# Patient Record
Sex: Female | Born: 1935 | Race: White | Hispanic: No | Marital: Married | Smoking: Never smoker
Health system: Southern US, Community
[De-identification: ages and names within clinical notes are randomized; demographics above are authoritative.]

## PROBLEM LIST (undated history)

## (undated) DIAGNOSIS — I639 Cerebral infarction, unspecified: Secondary | ICD-10-CM

## (undated) DIAGNOSIS — H9190 Unspecified hearing loss, unspecified ear: Secondary | ICD-10-CM

## (undated) DIAGNOSIS — E785 Hyperlipidemia, unspecified: Secondary | ICD-10-CM

## (undated) DIAGNOSIS — I10 Essential (primary) hypertension: Secondary | ICD-10-CM

## (undated) HISTORY — DX: Essential (primary) hypertension: I10

## (undated) HISTORY — PX: LEG SURGERY: SHX1003

## (undated) HISTORY — DX: Unspecified hearing loss, unspecified ear: H91.90

## (undated) HISTORY — DX: Hyperlipidemia, unspecified: E78.5

## (undated) HISTORY — PX: BREAST SURGERY: SHX581

---

## 1998-06-23 ENCOUNTER — Inpatient Hospital Stay (HOSPITAL_COMMUNITY): Admission: AD | Admit: 1998-06-23 | Discharge: 1998-06-25 | Payer: Self-pay | Admitting: Cardiology

## 2001-06-07 ENCOUNTER — Encounter: Payer: Self-pay | Admitting: Internal Medicine

## 2001-06-07 ENCOUNTER — Ambulatory Visit (HOSPITAL_COMMUNITY): Admission: RE | Admit: 2001-06-07 | Discharge: 2001-06-07 | Payer: Self-pay | Admitting: Internal Medicine

## 2003-08-15 ENCOUNTER — Ambulatory Visit (HOSPITAL_COMMUNITY): Admission: RE | Admit: 2003-08-15 | Discharge: 2003-08-15 | Payer: Self-pay | Admitting: Internal Medicine

## 2004-06-07 ENCOUNTER — Inpatient Hospital Stay (HOSPITAL_COMMUNITY): Admission: EM | Admit: 2004-06-07 | Discharge: 2004-06-09 | Payer: Self-pay | Admitting: Emergency Medicine

## 2004-07-15 ENCOUNTER — Ambulatory Visit: Payer: Self-pay | Admitting: Orthopedic Surgery

## 2004-08-25 ENCOUNTER — Ambulatory Visit: Payer: Self-pay | Admitting: Orthopedic Surgery

## 2004-09-01 ENCOUNTER — Encounter (HOSPITAL_COMMUNITY): Admission: RE | Admit: 2004-09-01 | Discharge: 2004-10-01 | Payer: Self-pay | Admitting: Orthopedic Surgery

## 2004-09-08 ENCOUNTER — Ambulatory Visit: Payer: Self-pay | Admitting: Orthopedic Surgery

## 2004-09-22 ENCOUNTER — Ambulatory Visit: Payer: Self-pay | Admitting: Orthopedic Surgery

## 2004-10-04 ENCOUNTER — Ambulatory Visit: Payer: Self-pay | Admitting: Orthopedic Surgery

## 2004-10-04 ENCOUNTER — Encounter (HOSPITAL_COMMUNITY): Admission: RE | Admit: 2004-10-04 | Discharge: 2004-11-03 | Payer: Self-pay | Admitting: Orthopedic Surgery

## 2004-10-18 ENCOUNTER — Ambulatory Visit: Payer: Self-pay | Admitting: Orthopedic Surgery

## 2005-01-07 ENCOUNTER — Ambulatory Visit (HOSPITAL_COMMUNITY): Admission: RE | Admit: 2005-01-07 | Discharge: 2005-01-07 | Payer: Self-pay | Admitting: Internal Medicine

## 2005-03-11 ENCOUNTER — Ambulatory Visit (HOSPITAL_COMMUNITY): Admission: RE | Admit: 2005-03-11 | Discharge: 2005-03-11 | Payer: Self-pay | Admitting: Internal Medicine

## 2005-03-25 ENCOUNTER — Ambulatory Visit (HOSPITAL_COMMUNITY): Admission: RE | Admit: 2005-03-25 | Discharge: 2005-03-25 | Payer: Self-pay | Admitting: Neurosurgery

## 2005-07-11 ENCOUNTER — Ambulatory Visit (HOSPITAL_COMMUNITY): Admission: RE | Admit: 2005-07-11 | Discharge: 2005-07-11 | Payer: Self-pay | Admitting: Internal Medicine

## 2005-10-12 ENCOUNTER — Ambulatory Visit: Payer: Self-pay | Admitting: Internal Medicine

## 2005-10-12 ENCOUNTER — Encounter (INDEPENDENT_AMBULATORY_CARE_PROVIDER_SITE_OTHER): Payer: Self-pay | Admitting: Internal Medicine

## 2005-10-12 ENCOUNTER — Ambulatory Visit (HOSPITAL_COMMUNITY): Admission: RE | Admit: 2005-10-12 | Discharge: 2005-10-12 | Payer: Self-pay | Admitting: Internal Medicine

## 2006-06-28 ENCOUNTER — Ambulatory Visit (HOSPITAL_COMMUNITY): Admission: RE | Admit: 2006-06-28 | Discharge: 2006-06-28 | Payer: Self-pay | Admitting: Internal Medicine

## 2006-12-29 ENCOUNTER — Inpatient Hospital Stay (HOSPITAL_COMMUNITY): Admission: EM | Admit: 2006-12-29 | Discharge: 2007-01-01 | Payer: Self-pay | Admitting: *Deleted

## 2007-05-22 ENCOUNTER — Ambulatory Visit (HOSPITAL_COMMUNITY): Admission: RE | Admit: 2007-05-22 | Discharge: 2007-05-22 | Payer: Self-pay | Admitting: Internal Medicine

## 2008-05-22 ENCOUNTER — Ambulatory Visit (HOSPITAL_COMMUNITY): Admission: RE | Admit: 2008-05-22 | Discharge: 2008-05-22 | Payer: Self-pay | Admitting: Internal Medicine

## 2010-09-25 ENCOUNTER — Encounter: Payer: Self-pay | Admitting: Internal Medicine

## 2010-10-22 ENCOUNTER — Encounter (INDEPENDENT_AMBULATORY_CARE_PROVIDER_SITE_OTHER): Payer: Self-pay

## 2010-10-27 NOTE — Letter (Signed)
Summary: Recall Colonoscopy/Endoscopy, Change to Office Visit  [facility]  [address]   [place], [place] [address]   Phone: [phone]  Fax: [phone]      October 22, 2010   GENIENE LIST [address] [place], [place]  [address] [birth year]   Dear Ms. [facility],   According to our records, it is time for you to schedule a Colonoscopy/Endoscopy. However, after reviewing your medical record, we recommend an office visit in order to determine your need for a repeat procedure.  Please call [phone] at your convenience to schedule an office visit. If you have any questions or concerns, please feel free to contact our office.   Sincerely,   Cloria Spring LPN  [facility] Ph: [phone]   Fax: [phone]

## 2011-01-21 NOTE — Discharge Summary (Signed)
NAMEHOLLYE, PRITT               ACCOUNT NO.:  [id number]   MEDICAL RECORD NO.:  [id number]           PATIENT TYPE:  INP   LOCATION:                                FACILITY:  [facility]   PHYSICIAN:  Kingsley Callander. Ouida Sills, M.D.     DATE OF BIRTH:  [birth year]   DATE OF ADMISSION:  06/07/2004  DATE OF DISCHARGE:  LH                                 DISCHARGE SUMMARY   DISCHARGE DIAGNOSES:  1.  Right medial tibial plateau fracture.  2.  Right ankle fracture.  3.  Chest wall contusion.  4.  History of stroke.  5.  Hypertension.  6.  Hyperlipidemia.   HOSPITAL COURSE:  This patient is a 77 year old female who fell down a  flight of stairs at home.  She was evaluated in the emergency room and found  to have a medial tibial plateau fracture on the right as well as a fracture  of the right medial malleolus.  She underwent a CT scan of the head which  was negative.  She underwent a CT scan of the chest which was negative.  She  had persistent chest pain due to chest wall contusion.  She was hospitalized  and treated with analgesics as needed.  She was seen in orthopedic  consultation by Dr. Romeo Apple.  An air cast and knee immobilizer were placed  on the right leg.  She was evaluated and treated with physical therapy.  She  was felt to be improved and stable for discharge by October 5.  She will  have home health physical therapy.  She was treated with Lovenox while  hospitalized.   DISCHARGE MEDICATIONS:  1.  Darvocet q.4h. p.r.n.  2.  Ambien q.h.s. p.r.n.  3.  Altace 5 mg daily.  4.  Zocor 40 mg daily.  5.  Aspirin 81 mg daily.  6.  Xanax 0.5 q.4h. p.r.n.   FOLLOWUP:  Patient will see Dr. Romeo Apple in follow-up in two weeks.  She  will follow up in my office as scheduled or as needed.       ___________________________________________  Kingsley Callander. Ouida Sills, M.D.    ROF/MEDQ  D:  06/09/2004  T:  06/09/2004  Job:  [id number]

## 2011-01-21 NOTE — H&P (Signed)
NAMEMESHA, SCHAMBERGER               ACCOUNT NO.:  [id number]   MEDICAL RECORD NO.:  [id number]          PATIENT TYPE:  EMS   LOCATION:  ED                            FACILITY:  [facility]   PHYSICIAN:  Melvyn Novas, MDDATE OF BIRTH:  10-19-1930   DATE OF ADMISSION:  06/07/2004  DATE OF DISCHARGE:  LH                                HISTORY & PHYSICAL   The patient is a 77 year old white female who apparently got up in the  middle of the night to use the bathroom in the dark and fell down 14 stairs.  She denied any antecedent palpitations, dizziness, chest pain, or syncope  prior to fall.  She states she just fell in the dark.  She had no loss of  consciousness, no seizure activity reported by husband.  She arrives in the  ER.  She has multiple contusions of chest and back and has a fractured right  medial malleolus and a medial tibial plateau fracture as well as a right  ankle laceration over the Achilles area.  She is admitted for analgesia,  observation, and orthopedic consultation for the above issues.  She is alert  and oriented right now and complains of mild pleuritic pain upon  respirations.  She has a CT scan of her head and chest, which are  essentially negative for fractures.   PAST MEDICAL HISTORY:  1.  Status post CVA with residual dysarthria.  2.  Hypertension.  3.  Hyperlipidemia.  4.  Anxiety.   PAST SURGICAL HISTORY:  1.  Tonsillectomy.  2.  Left breast biopsy.   SOCIAL HISTORY:  She lives with her husband.  She smoked for 40 years, one  pack per day.  She quit in February 2005, but she still smokes a few  cigarettes according to the husband.   CURRENT MEDICATIONS:  1.  Altace 5 mg per day.  2.  Aspirin 81 mg per day.  3.  Xanax 0.5 mg p.r.n.  4.  Zocor 40 mg per day.   She has no known allergies.   PHYSICAL EXAMINATION:  VITAL SIGNS:  Blood pressure 129/62, pulse 57 and  regular, respiratory rate is 20.  She is afebrile.  HEENT:  Head  normocephalic, atraumatic.  Eyes PERRLA, EOMI.  Sclerae clear,  conjunctivae pink.  NECK:  No carotid bruits, no thyromegaly, no thyroid bruits.  CHEST:  Lungs show good breath sounds in both bases.  No rales, wheeze, or  rhonchi appreciable.  CARDIAC:  Regular rhythm, S1, S2 of normal intensity, no S3, S4 gallops.  No  murmurs, no heaves, thrills, or rubs appreciable.  ABDOMEN:  Soft, nontender, bowel sounds normoactive.  No active guarding or  rebound, no mass, no megaly.  EXTREMITIES:  Right ankle has a cast immobilizer on it currently.  She has  multiple contusions of the left ankle and a small laceration of the right  Achilles area.  NEUROLOGIC:  Cranial nerves II-XII grossly intact.  The patient has mild  dysarthria.  Sensorimotor 5/5 all extremities.  Plantars downgoing.   IMPRESSION:  1.  Status post fall with  multiple contusions of ribs and back.  2.  Fractured right medial malleolus and medial tibial plateau fracture.  3.  Hypertension.  4.  Hyperlipidemia.  5.  Anxiety.   The plan is to admit, obtain neurologic consultation with Vickki Hearing, M.D., and control pain with Dilaudid 1-2 mg IV q.4h. p.r.n.  Get  CBC, CMP, 12-lead EKG, and continue Altace 5 mg per day.  Will add Lovenox  30 mg subcu daily, and I will make further recommendations as the data base  expands.      RMD/MEDQ  D:  06/07/2004  T:  06/07/2004  Job:  [id number]

## 2011-01-21 NOTE — Op Note (Signed)
NAMESWATHI, DAUPHIN               ACCOUNT NO.:  [id number]   MEDICAL RECORD NO.:  [id number]          PATIENT TYPE:  AMB   LOCATION:  DAY                           FACILITY:  [facility]   PHYSICIAN:  Lionel December, M.D.    DATE OF BIRTH:  [birth year]   DATE OF PROCEDURE:  10/12/2005  DATE OF DISCHARGE:                                 OPERATIVE REPORT   PROCEDURE:  Colonoscopy with polypectomy.   INDICATION:  Amy Sawyer is a 77 year old Caucasian female who is here for  surveillance colonoscopy. Last exam was in August 1999 with removal of  multiple polyps, most of which were hyperplastic, but one had villus change  in it. Family history is negative for colorectal carcinoma. She states she  has been constipated for about a month. However, she remains with good  appetite and stable weight. Procedure and risks were reviewed with the  patient, and informed consent was obtained.   MEDICINES FOR CONSCIOUS SEDATION:  Demerol 50 mg IV, Versed 4 mg IV.   FINDINGS:  Procedure performed in endoscopy suite. The patient's vital signs  and O2 saturation were monitored during the procedure remained stable. The  patient was placed in left lateral position and rectal examination  performed. No abnormality noted on external or digital exam. Olympus  videoscope was placed in the rectum and advanced under vision into sigmoid  colon and beyond. Preparation was satisfactory. She had diverticula  scattered throughout the colon. Scope was passed into cecum which was  identified by ileocecal valve and appendiceal orifice. She had some stool in  this area, and therefore she had to be turnaround to see the blunt-end cecum  well. As the scope was withdrawn, colonic mucosa was carefully examined.  There was 5- to 6-mm polyp at midsigmoid colon which was snared. There was  another small polyp at rectum which was snared but lost. Scope was  retroflexed to examine anorectal junction which was unremarkable. Endoscope  was  straightened and withdrawn. The patient tolerated the procedure well.   FINAL DIAGNOSES:  1.  Pan colonic diverticulosis.  2.  Two small polyp snared, one from sigmoid colon, another one from the      rectum. Rectal polyp was lost.   RECOMMENDATIONS:  1.  Standard instructions given.  2.  High-fiber diet plus Citrucel or equivalent 1 tablespoonful daily.  3.  She can try Colace 2 tablets at bedtime for constipation. If it does not      help, we will start on lactulose.   I will be contacting the patient with biopsy results and further  recommendations.      Lionel December, M.D.  Electronically Signed     NR/MEDQ  D:  10/12/2005  T:  10/12/2005  Job:  [id number]   cc:   Kingsley Callander. Ouida Sills, MD  Fax: [phone]

## 2011-01-21 NOTE — Discharge Summary (Signed)
NAMEDESARAY, MARSCHNER               ACCOUNT NO.:  [id number]   MEDICAL RECORD NO.:  [id number]          PATIENT TYPE:  INP   LOCATION:  5014                         FACILITY:  [facility]   PHYSICIAN:  Madelynn Done, MD  DATE OF BIRTH:  05/07/1931   DATE OF ADMISSION:  12/29/2006  DATE OF DISCHARGE:  01/01/2007                               DISCHARGE SUMMARY   DISCHARGE DIAGNOSES:  1. Dog scratch, right hand.  2. Right hand cellulitis.  3. Hypertension.  4. Hypercholesterolemia.   REASON FOR ADMISSION:  Hand cellulitis for IV antibiotics and  immobilization.   PROCEDURES AND DATES:  None.   DISCHARGE MEDICATIONS:  1. Augmentin 875 mg p.o. twice a day.  2. Tylenol #3, one to two tablets every 4 to 6 hours as needed for      pain.  3. Altace.  Resume home dose.  4. Zocor.  Resume home dose.  5. Multivitamin.  Resume home dose.   BRIEF HISTORY:  Mrs. Scinto is a 77 year old right-handed dominant  female who sustained a dog scratch on December 27, 2006.  The patient  presented to her primary care physician on December 29, 2006, with  worsening pain and swelling on the dorsum of her hand.  The patient was  seen and evaluated in the emergency department, and it was felt that she  had a hand cellulitis with ascending erythema and needed to be admitted  for intravenous antibiotics and immobilization and possible surgery if  her condition worsens.   HOSPITAL COURSE:  The patient was admitted to the hospital, begun on IV  Unasyn.  The patient was also placed in a resting hand splint to  immobilize the hand and wrist.  The patient was followed closely and  examined on hospital day #1 and felt that her symptoms were with less  discomfort as well as improvement in the overall appearance of the hand.  It was not felt necessary that she needed to undergo an operation to  drain any focal areas of abscess or infection.  The patient was seen  throughout her hospital course on hospital days #2 and  #3, and it  continued to improve with the above treatment regimen.  On hospital day  #3, she was afebrile.  Her vital signs were stable and normal.  She was  tolerating a regular diet.  Her pain was controlled, and her hand looked  much better.  There was less redness.  There was no fluctuance.  She was  getting back with skin wrinkles to her hand.  She was felt ready to be  discharged to home.   RECOMMENDATIONS AND DISPOSITION:  The patient will be discharged to  home.  She will continue with the resting hand splint to be worn at all  times.  She will continue with the above discharge medications.  She is  going to come back and see me in the office in 4 days for wound check.  She was instructed to return to clinic sooner if she  has any worsening fever, chills, pain, or swelling, or ascending  erythema up  into her arm.  Prior to her discharge, the patient's  discharge instructions were explained to her.  Questions were answered,  and the patient voiced understanding of the discharge instructions.      Madelynn Done, MD  Electronically Signed     FWO/MEDQ  D:  01/01/2007  T:  01/01/2007  Job:  [id number]

## 2011-09-29 DIAGNOSIS — N390 Urinary tract infection, site not specified: Secondary | ICD-10-CM | POA: Diagnosis not present

## 2011-11-23 DIAGNOSIS — Z79899 Other long term (current) drug therapy: Secondary | ICD-10-CM | POA: Diagnosis not present

## 2011-11-23 DIAGNOSIS — N390 Urinary tract infection, site not specified: Secondary | ICD-10-CM | POA: Diagnosis not present

## 2011-11-23 DIAGNOSIS — I6789 Other cerebrovascular disease: Secondary | ICD-10-CM | POA: Diagnosis not present

## 2011-11-29 DIAGNOSIS — F411 Generalized anxiety disorder: Secondary | ICD-10-CM | POA: Diagnosis not present

## 2011-11-29 DIAGNOSIS — E785 Hyperlipidemia, unspecified: Secondary | ICD-10-CM | POA: Diagnosis not present

## 2011-11-29 DIAGNOSIS — I10 Essential (primary) hypertension: Secondary | ICD-10-CM | POA: Diagnosis not present

## 2012-03-13 ENCOUNTER — Other Ambulatory Visit (HOSPITAL_COMMUNITY): Payer: Self-pay | Admitting: Internal Medicine

## 2012-03-13 ENCOUNTER — Ambulatory Visit (HOSPITAL_COMMUNITY)
Admission: RE | Admit: 2012-03-13 | Discharge: 2012-03-13 | Disposition: A | Discharge status: Home / Self Care | Payer: Medicare Other | Source: Ambulatory Visit | Attending: Internal Medicine | Admitting: Internal Medicine

## 2012-03-13 ENCOUNTER — Ambulatory Visit (INDEPENDENT_AMBULATORY_CARE_PROVIDER_SITE_OTHER): Payer: Medicare Other | Admitting: Orthopedic Surgery

## 2012-03-13 ENCOUNTER — Encounter: Payer: Self-pay | Admitting: Orthopedic Surgery

## 2012-03-13 VITALS — BP 124/70 | Ht 64.0 in | Wt 125.0 lb

## 2012-03-13 DIAGNOSIS — Z043 Encounter for examination and observation following other accident: Secondary | ICD-10-CM | POA: Diagnosis not present

## 2012-03-13 DIAGNOSIS — S42209A Unspecified fracture of upper end of unspecified humerus, initial encounter for closed fracture: Secondary | ICD-10-CM

## 2012-03-13 DIAGNOSIS — W19XXXA Unspecified fall, initial encounter: Secondary | ICD-10-CM

## 2012-03-13 DIAGNOSIS — S42213A Unspecified displaced fracture of surgical neck of unspecified humerus, initial encounter for closed fracture: Secondary | ICD-10-CM | POA: Insufficient documentation

## 2012-03-13 DIAGNOSIS — M25519 Pain in unspecified shoulder: Secondary | ICD-10-CM | POA: Insufficient documentation

## 2012-03-13 HISTORY — DX: Unspecified fracture of upper end of unspecified humerus, initial encounter for closed fracture: S42.209A

## 2012-03-13 MED ORDER — HYDROCODONE-ACETAMINOPHEN 7.5-325 MG PO TABS: 1.0000 | ORAL_TABLET | ORAL | Status: AC | PRN

## 2012-03-13 NOTE — Progress Notes (Signed)
  Subjective:    Patient ID: Amy Sawyer, female    DOB: June 02, 1931, 78 y.o.   MRN: [id number]  Shoulder Injury  The incident occurred at home. The left shoulder is affected. The incident occurred 6 to 12 hours ago. The injury mechanism was a fall. The quality of the pain is described as stabbing. The pain does not radiate. The pain is at a severity of 10/10. The pain is severe. Pertinent negatives include no chest pain, muscle weakness, numbness or tingling.      Review of Systems  Cardiovascular: Negative for chest pain.  Neurological: Negative for tingling and numbness.  All other systems reviewed and are negative.       Objective:   Physical Exam  Constitutional: She is oriented to person, place, and time. She appears well-developed and well-nourished.  HENT:  Head: Normocephalic.  Neck: Neck supple. No tracheal deviation present.  Cardiovascular: Normal rate.   Pulmonary/Chest: Effort normal.  Abdominal: She exhibits no distension.  Musculoskeletal:       Left shoulder: She exhibits decreased range of motion, tenderness, bony tenderness, swelling, crepitus and pain. She exhibits no deformity, no laceration, no spasm, normal pulse and normal strength.       Right elbow: Normal.      Left elbow: Normal.       Right wrist: Normal.       Left wrist: Normal.       Right hip: Normal.       Left hip: Normal.       Right upper leg: Normal.       Left upper leg: Normal.  Lymphadenopathy:       Right: Supraclavicular adenopathy present.       Left: No supraclavicular adenopathy present.  Neurological: She is alert and oriented to person, place, and time. She has normal reflexes.  Skin: Skin is warm and dry.  Psychiatric: She has a normal mood and affect. Her behavior is normal. Judgment and thought content normal.    X-rays were done at the hospital. Dr. Ouida Sills, has called for any evaluation for this patient shows a comminuted proximal humerus fracture with no displacement  slight angulation      Assessment & Plan:  LEFT proximal humerus fracture.  Sling.  [place]co for pain

## 2012-03-13 NOTE — Patient Instructions (Addendum)
Wear sling x 4 weeks   Sleep up right x 5 days     Humerus Fracture, Treated with Immobilization The humerus is the large bone in your upper arm. You have a broken (fractured) humerus. These fractures are easily diagnosed with X-rays. TREATMENT   Simple fractures which will heal without disability are treated with simple immobilization. Immobilization means you will wear a cast, splint, or sling. You have a fracture which will do well with immobilization. The fracture will heal well simply by being held in a good position until it is stable enough to begin range of motion exercises. Do not take part in activities which would further injure your arm.   HOME CARE INSTRUCTIONS    Put ice on the injured area.   Put ice in a plastic bag.   Place a towel between your skin and the bag.   Leave the ice on for 15 to 20 minutes, 3 to 4 times a day.   If you have a cast:   Do not scratch the skin under the cast using sharp or pointed objects.   Check the skin around the cast every day. You may put lotion on any red or sore areas.   Keep your cast dry and clean.   If you have a splint:   Wear the splint as directed.   Keep your splint dry and clean.   You may loosen the elastic around the splint if your fingers become numb, tingle, or turn cold or blue.   If you have a sling:   Wear the sling as directed.   Do not put pressure on any part of your cast or splint until it is fully hardened.   Your cast or splint can be protected during bathing with a plastic bag. Do not lower the cast or splint into water.   Only take over-the-counter or prescription medicines for pain, discomfort, or fever as directed by your caregiver.   Do range of motion exercises as instructed by your caregiver.   Follow up as directed by your caregiver. This is very important in order to avoid permanent injury or disability and chronic pain.  SEEK IMMEDIATE MEDICAL CARE IF:    Your skin or nails in the  injured arm turn blue or gray.   Your arm feels cold or numb.   You develop severe pain in the injured arm.   You are having problems with the medicines you were given.  MAKE SURE YOU:    Understand these instructions.   Will watch your condition.   Will get help right away if you are not doing well or get worse.  Document Released: 11/28/2000 Document Revised: 08/11/2011 Document Reviewed: 10/06/2010 [facility] Patient Information 2012 [place], [place].

## 2012-03-19 ENCOUNTER — Encounter: Payer: Self-pay | Admitting: Orthopedic Surgery

## 2012-03-19 ENCOUNTER — Ambulatory Visit (INDEPENDENT_AMBULATORY_CARE_PROVIDER_SITE_OTHER): Payer: Medicare Other | Admitting: Orthopedic Surgery

## 2012-03-19 VITALS — BP 142/72 | Ht 64.0 in | Wt 125.0 lb

## 2012-03-19 DIAGNOSIS — S42209A Unspecified fracture of upper end of unspecified humerus, initial encounter for closed fracture: Secondary | ICD-10-CM

## 2012-03-19 MED ORDER — ACETAMINOPHEN-CODEINE #3 300-30 MG PO TABS: 1.0000 | ORAL_TABLET | ORAL | Status: AC | PRN

## 2012-03-19 MED ORDER — PROMETHAZINE HCL 12.5 MG PO TABS: 12.5000 mg | ORAL_TABLET | Freq: Four times a day (QID) | ORAL | Status: DC | PRN

## 2012-03-19 NOTE — Progress Notes (Signed)
Patient ID: Amy Sawyer, female   DOB: October 05, 1930, 78 y.o.   MRN: [id number] Status post left proximal humerus fracture treated with sling patient came in for hand swelling and nausea  She is on hydrocodone became nauseous  Left hand swelling with ecchymosis tracking from the proximal humerus down across the forearm into the hand. Passive range of motion of the hand is normal the neurovascular exam of the hand is normal the compartments are soft  Residual edema status post left proximal humerus fracture recommend Ace wrap from her hands up to her elbow readjust sling  Change pain medication to Tylenol with codeine along with Phenergan keep her previous appointment

## 2012-03-26 ENCOUNTER — Ambulatory Visit (INDEPENDENT_AMBULATORY_CARE_PROVIDER_SITE_OTHER): Payer: Medicare Other

## 2012-03-26 ENCOUNTER — Encounter: Payer: Self-pay | Admitting: Orthopedic Surgery

## 2012-03-26 ENCOUNTER — Ambulatory Visit (INDEPENDENT_AMBULATORY_CARE_PROVIDER_SITE_OTHER): Payer: Medicare Other | Admitting: Orthopedic Surgery

## 2012-03-26 VITALS — BP 130/70 | Ht 64.0 in | Wt 125.0 lb

## 2012-03-26 DIAGNOSIS — S42209A Unspecified fracture of upper end of unspecified humerus, initial encounter for closed fracture: Secondary | ICD-10-CM

## 2012-03-26 DIAGNOSIS — S4290XA Fracture of unspecified shoulder girdle, part unspecified, initial encounter for closed fracture: Secondary | ICD-10-CM

## 2012-03-26 NOTE — Progress Notes (Signed)
Patient ID: Amy Sawyer, female   DOB: 10/17/1930, 78 y.o.   MRN: [id number] Chief Complaint  Patient presents with  . Follow-up    2 week recheck and xray Left shoulder fx,    LEFT proximal humerus fracture.  X-rays show fracture consolidation.  Start occupational therapy and follow up in 2 months

## 2012-03-26 NOTE — Patient Instructions (Addendum)
Call hospital start occupational therapy   Call Dr Ouida Sills to report edema in legs

## 2012-04-05 DIAGNOSIS — R609 Edema, unspecified: Secondary | ICD-10-CM | POA: Diagnosis not present

## 2012-04-09 ENCOUNTER — Ambulatory Visit (HOSPITAL_COMMUNITY)
Admission: RE | Admit: 2012-04-09 | Discharge: 2012-04-09 | Disposition: A | Discharge status: Home / Self Care | Payer: Medicare Other | Source: Ambulatory Visit | Attending: Orthopedic Surgery | Admitting: Orthopedic Surgery

## 2012-04-09 DIAGNOSIS — M6281 Muscle weakness (generalized): Secondary | ICD-10-CM | POA: Insufficient documentation

## 2012-04-09 DIAGNOSIS — M25519 Pain in unspecified shoulder: Secondary | ICD-10-CM | POA: Diagnosis not present

## 2012-04-09 DIAGNOSIS — S42209A Unspecified fracture of upper end of unspecified humerus, initial encounter for closed fracture: Secondary | ICD-10-CM

## 2012-04-09 DIAGNOSIS — IMO0001 Reserved for inherently not codable concepts without codable children: Secondary | ICD-10-CM | POA: Diagnosis not present

## 2012-04-09 NOTE — Evaluation (Signed)
Occupational Therapy Evaluation  Patient Details  Name: Amy Sawyer MRN: [id number] Date of Birth: 11/19/1930  Today's Date: 04/09/2012 Time: 4098-1191 OT Time Calculation (min): 45 min OT Evaluation 4782-9562 15' Manual Therapy 1308-6578 20' Visit#: 1  of 16   Re-eval: 05/07/12  Assessment Diagnosis: Left Proximal Humerus Fracture Prior Therapy: n/a  Authorization: Medicare  Authorization Time Period: before 10th visit  Authorization Visit#: 1  of 10    Past Medical History:  Past Medical History  Diagnosis Date  . Hyperlipemia   . HTN (hypertension)    Past Surgical History:  Past Surgical History  Procedure Date  . Breast surgery   . Leg surgery     Subjective S:  I fell on my left side and I broke my shoulder and my toe. Pertinent History: Mrs. Jagiello was carrying a large potted plant outside on 03/13/12.  She tripped and fell, landing on her left side against the wheelchair ramp in front of her home.  She went to the ER and was diagnosed with a fractured toe and a left proximal humerus fracture.  She has been referred to occupational therapy for evaluation and treatment to include modalites as needed to increase ROM and rotator cuff strengthening. Limitations: PROM through 04/24/12 (6 weeks post fracture). Special Tests: UEFI 32/80= 40% independence level Patient Stated Goals: I want to be back to normal. Pain Assessment Currently in Pain?: Yes Pain Score:   2 Pain Location: Shoulder Pain Orientation: Left Pain Type: Acute pain Pain Relieving Factors: rest and immobilizing with sling  Precautions/Restrictions  Precautions Precautions: Shoulder Type of Shoulder Precautions: PROM x 6 weeks from injury  Prior Functioning  Home Living Lives With: Spouse;Daughter Available Help at Discharge: Family Type of Home: House Prior Function Level of Independence: Independent with basic ADLs Driving: Yes Vocation: Retired Leisure: Hobbies-yes  (Comment) Comments: Enjoys cooking, yardwork, and reading  Assessment ADL/Vision/Perception ADL ADL Comments: Unable to use LUE with any activities above shoulder height. Dominant Hand: Right Vision - History Baseline Vision: Wears glasses all the time  Cognition/Observation Cognition Overall Cognitive Status: Appears within functional limits for tasks assessed Comments: Hard of Hearing Observation/Other Assessments Observations: Very guarded and tense during PROM and manual portion of this assessment  Sensation/Coordination/Edema Sensation Light Touch: Appears Intact Coordination Gross Motor Movements are Fluid and Coordinated: Yes Fine Motor Movements are Fluid and Coordinated: Yes Edema Edema: Minimal edema and bruising throughout LUE   Additional Assessments LUE AROM (degrees) Left Elbow Flexion: 145  Left Elbow Extension: 30  LUE PROM (degrees) LUE Overall PROM Comments: Assessed in supine, ER and IR with shoulder adducted Left Shoulder Flexion: 30 Degrees Left Shoulder ABduction: 45 Degrees Left Shoulder Internal Rotation: 60 Degrees Left Shoulder External Rotation: 5 Degrees Left Elbow Flexion: 145  Left Elbow Extension: 0  Palpation Palpation: Moderate-max fascial restrictions noted in her left shoulder region.       Exercise/Treatments    Manual Therapy Manual Therapy: Myofascial release Myofascial Release: MFR and manual stretching to left upper arm, anterior shoulder, posterior shoulder, and trapezius region to decrease pain and restrictions and increase pain free PROM in LUE.   Occupational Therapy Assessment and Plan OT Assessment and Plan Clinical Impression Statement: 78 year old female presents with decreased range of motion, strength, and increased pain and fascial restrictions in her left shoulder region s/p fracture from a fall.   Pt will benefit from skilled therapeutic intervention in order to improve on the following deficits: Decreased range  of motion;Decreased  strength;Increased muscle spasms;Increased fascial restricitons;Pain;Impaired UE functional use Rehab Potential: Good OT Frequency: Min 2X/week OT Duration: 8 weeks OT Treatment/Interventions: Self-care/ADL training;Therapeutic exercise;Therapeutic activities;Manual therapy;Patient/family education OT Plan: P:  skilled OT intervention to decrease pain and restrictions and increase A/PROM and strength in order to return to PLOF with all daily activities.  Treatment Plan:  MFR to shoulder and elbow region to decrease pain and restrictions.  Supine PROM of shoulder and elbow.  AROM of elbow.  isometric strengthening of shoulder. bridges.  Seated elev, ext, row.  therapy ball stretches.  PROM through 04/24/12   Goals Short Term Goals Time to Complete Short Term Goals: 4 weeks Short Term Goal 1: Patient will be educated on a HEP. Short Term Goal 2: Patient will increase left shoulder and elbow PROM to [facility] for increased ability to reach into overhead cabinet. Short Term Goal 3: Patient will increase left shoulder and elbow strength to 3+/5 for increased ability to lift pots and pans when cooking. Short Term Goal 4: Patient will decrease pain in her left shoulder to 2/10 with functional activities. Short Term Goal 5: Patient will decrease fascial restrictions from mod-max to moderate in her left shoulder region for increased mobility during functional tasks. Long Term Goals Time to Complete Long Term Goals: 8 weeks Long Term Goal 1: Patient will complete all B/IADLs and leisure activiites using LUE as an active assist without pain or restricitons. Long Term Goal 2: Patient will increase left shoulder and elbow AROM to WNL for increased ability to reach overhead when working in her yard. Long Term Goal 3: Patient will increase left shoulder and elbow strength to 4+/5 for increased ability to lift pots and potting soil when working in her yard. Long Term Goal 4: Patient will have 1/10  or less pain in her left shoulder when cooking and cleaning. Long Term Goal 5: Patient will have minimal restrictions in her left shoulder and elbow region.  Problem List Patient Active Problem List  Diagnosis  . Proximal humerus fracture  . Pain in joint, shoulder region  . Muscle weakness (generalized)    End of Session Activity Tolerance: Patient tolerated treatment well General Behavior During Session: [facility] for tasks performed Cognition: [facility] for tasks performed OT Plan of Care OT Home Exercise Plan: Educated patient on HEP for shoulder pendulums and towel slides and AROM of elbow, forearm, and wrist. Consulted and Agree with Plan of Care: Patient  GO Functional Assessment Tool Used: UEFI scored 32/80 = 40% I level, 60% disability level Functional Limitation: Carrying, moving and handling objects Carrying, Moving and Handling Objects Current Status (F6213): At least 60 percent but less than 80 percent impaired, limited or restricted Carrying, Moving and Handling Objects Goal Status [phone]): At least 1 percent but less than 20 percent impaired, limited or restricted  Shirlean Mylar, OTR/L  04/09/2012, 4:47 PM  Physician Documentation Your signature is required to indicate approval of the treatment plan as stated above.  Please sign and either send electronically or make a copy of this report for your files and return this physician signed original.  Please mark one 1.__approve of plan  2. ___approve of plan with the following conditions.   ______________________________  _____________________ Physician Signature                                                                                                             Date

## 2012-04-11 ENCOUNTER — Ambulatory Visit (HOSPITAL_COMMUNITY)
Admission: RE | Admit: 2012-04-11 | Discharge: 2012-04-11 | Disposition: A | Discharge status: Home / Self Care | Payer: Medicare Other | Source: Ambulatory Visit | Attending: Internal Medicine | Admitting: Internal Medicine

## 2012-04-11 DIAGNOSIS — M25519 Pain in unspecified shoulder: Secondary | ICD-10-CM

## 2012-04-11 DIAGNOSIS — M6281 Muscle weakness (generalized): Secondary | ICD-10-CM

## 2012-04-11 NOTE — Progress Notes (Signed)
Occupational Therapy Treatment Patient Details  Name: Amy Sawyer MRN: [id number] Date of Birth: [birth year]  Today's Date: 04/11/2012 Time: 4098-1191 OT Time Calculation (min): 33 min Manual Therapy 4782-9562 15' Therapeutic Exercises 1412-1430 18' Visit#: 2  of 16   Re-eval: 05/07/12    Authorization: Medicare  Authorization Time Period: before 10th visit   Authorization Visit#: 2  of 10   Subjective S:  I did the exercises a little bit yesterday.   Limitations: PROM through 04/24/12 (6 weeks post fracture).  Pain Assessment Currently in Pain?: Yes Pain Score:   3 Pain Location: Shoulder Pain Orientation: Left Pain Type: Acute pain  Precautions/Restrictions   PROM through 04/24/12  Exercise/Treatments Supine Protraction: PROM;10 reps Horizontal ABduction: PROM;10 reps External Rotation: PROM;10 reps Internal Rotation: PROM;10 reps Flexion: PROM;10 reps ABduction: PROM;10 reps Seated Elevation: AROM;10 reps Extension: AROM;10 reps Row: AROM;10 reps Therapy Ball Flexion:  (attempted, but unable to complete due to pain) Isometric Strengthening   Flexion: Supine;3X3" Extension: Supine;3X3" External Rotation: Supine;3X3" Internal Rotation: Supine;3X3" ABduction: Supine;3X3" ADduction: Supine;3X3" Elbow Exercises Elbow Flexion: PROM;10 reps Elbow Extension: PROM;10 reps        Manual Therapy Manual Therapy: Myofascial release Myofascial Release: MFR and manual stretching to left upper arm, anterior shoulder, posterior shoulder, and trapezius, and elbow region to decrease pain and restrictions and increase pain free PROM in LUE 1308-6578   Occupational Therapy Assessment and Plan OT Assessment and Plan Clinical Impression Statement: A:  Patient very tender to touch with MFR due to significant bruising.  Attempted therapy ball flexion and abduction, but unable to complete due to exercises exacerbated her pain level. OT Plan: P:  Attempt ball stretches, add  AROM of elbow, forearm, wrist.   Goals Short Term Goals Time to Complete Short Term Goals: 4 weeks Short Term Goal 1: Patient will be educated on a HEP. Short Term Goal 2: Patient will increase left shoulder and elbow PROM to [facility] for increased ability to reach into overhead cabinet. Short Term Goal 3: Patient will increase left shoulder and elbow strength to 3+/5 for increased ability to lift pots and pans when cooking. Short Term Goal 4: Patient will decrease pain in her left shoulder to 2/10 with functional activities. Short Term Goal 5: Patient will decrease fascial restrictions from mod-max to moderate in her left shoulder region for increased mobility during functional tasks. Long Term Goals Time to Complete Long Term Goals: 8 weeks Long Term Goal 1: Patient will complete all B/IADLs and leisure activiites using LUE as an active assist without pain or restricitons. Long Term Goal 2: Patient will increase left shoulder and elbow AROM to WNL for increased ability to reach overhead when working in her yard. Long Term Goal 3: Patient will increase left shoulder and elbow strength to 4+/5 for increased ability to lift pots and potting soil when working in her yard. Long Term Goal 4: Patient will have 1/10 or less pain in her left shoulder when cooking and cleaning. Long Term Goal 5: Patient will have minimal restrictions in her left shoulder and elbow region.  Problem List Patient Active Problem List  Diagnosis  . Proximal humerus fracture  . Pain in joint, shoulder region  . Muscle weakness (generalized)    End of Session Activity Tolerance: Patient tolerated treatment well General Behavior During Session: [facility] for tasks performed  GO    Shirlean Mylar, OTR/L  04/11/2012, 3:39 PM

## 2012-04-16 ENCOUNTER — Ambulatory Visit (HOSPITAL_COMMUNITY)
Admission: RE | Admit: 2012-04-16 | Discharge: 2012-04-16 | Disposition: A | Discharge status: Home / Self Care | Payer: Medicare Other | Source: Ambulatory Visit | Attending: Internal Medicine | Admitting: Internal Medicine

## 2012-04-16 DIAGNOSIS — M25519 Pain in unspecified shoulder: Secondary | ICD-10-CM

## 2012-04-16 DIAGNOSIS — M6281 Muscle weakness (generalized): Secondary | ICD-10-CM

## 2012-04-16 NOTE — Progress Notes (Signed)
Occupational Therapy Treatment Patient Details  Name: ANESHIA JACQUET MRN: [id number] Date of Birth: June 15, 1931  Today's Date: 04/16/2012 Time: 1478-2956 OT Time Calculation (min): 35 min Manual Therapy 2130-8657 17' Therapeutic Exercises [id number] 18' Visit#: 3  of 16   Re-eval: 05/07/12    Authorization: Medicare  Authorization Time Period: before 10th visit   Authorization Visit#: 3  of 10   Subjective Symptoms/Limitations Symptoms: S:  Its so sore.  I guess that is to be expected.   Limitations: PROM through 04/24/12 (6 weeks post fracture).   Precautions/Restrictions   PROM through 04/24/12  Exercise/Treatments Supine Protraction: PROM;10 reps Horizontal ABduction: PROM;10 reps External Rotation: PROM;10 reps Internal Rotation: PROM;10 reps Flexion: PROM;10 reps ABduction: PROM;10 reps Other Supine Exercises: begin elbow-wrist AROM next visit Seated Elevation: AROM;10 reps Extension: AROM;10 reps Row: AROM;10 reps Therapy Ball Flexion: 10 reps;Limitations Flexion Limitations: using smaller green ball with hand over hand assist, able to complete 25% range. ABduction: 10 reps;Limitations ABduction Limitations: using smaller green ball with hand over hand assist, able to complete 25% range. ROM / Strengthening / Isometric Strengthening   Flexion: Supine;3X3" Extension: Supine;3X3" External Rotation: Supine;3X3" Internal Rotation: Supine;3X3" ABduction: Supine;3X3" ADduction: Supine;3X3"    Manual Therapy Manual Therapy: Myofascial release Myofascial Release: MFR and manual stretching to left upper arm, anterior shoulder, posterior shoulder, and trapezius, and elbow region to decrease pain and restrictions and increase pain free PROM in LUE 2841-3244  Occupational Therapy Assessment and Plan OT Assessment and Plan Clinical Impression Statement: A:  Slight increase in pain free PROM this date.  Able to complete 10 reps of ball stretch through available range  with hand over hand assistance.  OT Plan: P: Add AROM of elbow, forearm, wrist. Decrease amount of assistance needed with ball stretches.   Goals Short Term Goals Time to Complete Short Term Goals: 4 weeks Short Term Goal 1: Patient will be educated on a HEP. Short Term Goal 1 Progress: Progressing toward goal Short Term Goal 2: Patient will increase left shoulder and elbow PROM to [facility] for increased ability to reach into overhead cabinet. Short Term Goal 2 Progress: Progressing toward goal Short Term Goal 3: Patient will increase left shoulder and elbow strength to 3+/5 for increased ability to lift pots and pans when cooking. Short Term Goal 3 Progress: Progressing toward goal Short Term Goal 4: Patient will decrease pain in her left shoulder to 2/10 with functional activities. Short Term Goal 4 Progress: Progressing toward goal Short Term Goal 5: Patient will decrease fascial restrictions from mod-max to moderate in her left shoulder region for increased mobility during functional tasks. Short Term Goal 5 Progress: Progressing toward goal Long Term Goals Time to Complete Long Term Goals: 8 weeks Long Term Goal 1: Patient will complete all B/IADLs and leisure activiites using LUE as an active assist without pain or restricitons. Long Term Goal 1 Progress: Progressing toward goal Long Term Goal 2: Patient will increase left shoulder and elbow AROM to WNL for increased ability to reach overhead when working in her yard. Long Term Goal 2 Progress: Progressing toward goal Long Term Goal 3: Patient will increase left shoulder and elbow strength to 4+/5 for increased ability to lift pots and potting soil when working in her yard. Long Term Goal 3 Progress: Progressing toward goal Long Term Goal 4: Patient will have 1/10 or less pain in her left shoulder when cooking and cleaning. Long Term Goal 4 Progress: Progressing toward goal Long Term Goal 5: Patient will  have minimal restrictions in her  left shoulder and elbow region. Long Term Goal 5 Progress: Progressing toward goal  Problem List Patient Active Problem List  Diagnosis  . Proximal humerus fracture  . Pain in joint, shoulder region  . Muscle weakness (generalized)    End of Session Activity Tolerance: Patient tolerated treatment well General Behavior During Session: [facility] for tasks performed Cognition: [facility] for tasks performed  GO    Shirlean Mylar, OTR/L  04/16/2012, 2:31 PM

## 2012-04-18 ENCOUNTER — Ambulatory Visit (HOSPITAL_COMMUNITY)
Admission: RE | Admit: 2012-04-18 | Discharge: 2012-04-18 | Disposition: A | Discharge status: Home / Self Care | Payer: Medicare Other | Source: Ambulatory Visit | Attending: Orthopedic Surgery | Admitting: Orthopedic Surgery

## 2012-04-18 DIAGNOSIS — M6281 Muscle weakness (generalized): Secondary | ICD-10-CM

## 2012-04-18 DIAGNOSIS — M25519 Pain in unspecified shoulder: Secondary | ICD-10-CM

## 2012-04-18 NOTE — Progress Notes (Signed)
Occupational Therapy Treatment Patient Details  Name: Amy Sawyer MRN: [id number] Date of Birth: 05-Nov-1930  Today's Date: 04/18/2012 Time: 7829-5621 OT Time Calculation (min): 37 min Manual Therapy 3086-5784 22' Therapeutic Exercises [phone] 15' Visit#: 4  of 16   Re-eval: 05/07/12    Authorization: Medicare  Authorization Time Period: before 10th visit   Authorization Visit#: 4  of 10   Subjective S:  It would be good if I could just lift it better. Pain Assessment Currently in Pain?: Yes Pain Score:   3 Pain Location: Shoulder Pain Orientation: Left Pain Type: Acute pain  Precautions/Restrictions   PROM through 04/24/12 (6 weeks post fracture)   Exercise/Treatments Supine Protraction: PROM;10 reps Horizontal ABduction: PROM;10 reps External Rotation: PROM;10 reps Internal Rotation: PROM;10 reps Flexion: PROM;10 reps ABduction: PROM;10 reps Other Supine Exercises: elbow flexion, extension, supination, pronation, wrist flexion, extension x 10 reps in supine Seated Elevation: AROM;12 reps Extension: AROM;12 reps Row: AROM;12 reps Therapy Ball Flexion:  (12 reps with hand over hand assist) Flexion Limitations: using smaller green ball with hand over hand assist, able to complete 25% range. ABduction:  (12 reps) ABduction Limitations: using smaller green ball with hand over hand assist, able to complete 25% range. Isometric Strengthening   Flexion: 3X3" Extension: 3X3" External Rotation: 3X3" Internal Rotation: 3X3" ABduction: 3X3" ADduction: 3X3"    Manual Therapy Manual Therapy: Myofascial release Myofascial Release: MFR and manual stretching to left upper arm, anterior shoulder, posterior shoulder, and trapezius, and elbow region to decrease pain and restrictions and increase pain free PROM in LUE 4132-4401  Occupational Therapy Assessment and Plan OT Assessment and Plan Clinical Impression Statement: A:  Less pain with MFR and PROM this date.   Completed isometric strengthening in seated.  Continues to require hand over hand assistance with therapy ball stretches for proper technique. OT Plan: P:  Increase PROM to [facility] with minimal pain prior to beginning AAROM.  Increase isometric strengthening reps and contraction time.   Goals Short Term Goals Time to Complete Short Term Goals: 4 weeks Short Term Goal 1: Patient will be educated on a HEP. Short Term Goal 2: Patient will increase left shoulder and elbow PROM to [facility] for increased ability to reach into overhead cabinet. Short Term Goal 3: Patient will increase left shoulder and elbow strength to 3+/5 for increased ability to lift pots and pans when cooking. Short Term Goal 4: Patient will decrease pain in her left shoulder to 2/10 with functional activities. Short Term Goal 5: Patient will decrease fascial restrictions from mod-max to moderate in her left shoulder region for increased mobility during functional tasks. Long Term Goals Time to Complete Long Term Goals: 8 weeks Long Term Goal 1: Patient will complete all B/IADLs and leisure activiites using LUE as an active assist without pain or restricitons. Long Term Goal 2: Patient will increase left shoulder and elbow AROM to WNL for increased ability to reach overhead when working in her yard. Long Term Goal 3: Patient will increase left shoulder and elbow strength to 4+/5 for increased ability to lift pots and potting soil when working in her yard. Long Term Goal 4: Patient will have 1/10 or less pain in her left shoulder when cooking and cleaning. Long Term Goal 5: Patient will have minimal restrictions in her left shoulder and elbow region.  Problem List Patient Active Problem List  Diagnosis  . Proximal humerus fracture  . Pain in joint, shoulder region  . Muscle weakness (generalized)  End of Session Activity Tolerance: Patient tolerated treatment well General Behavior During Session: [facility] for tasks  performed Cognition: [facility] for tasks performed  GO    Shirlean Mylar, OTR/L  04/18/2012, 3:26 PM

## 2012-04-23 ENCOUNTER — Ambulatory Visit (HOSPITAL_COMMUNITY)
Admission: RE | Admit: 2012-04-23 | Discharge: 2012-04-23 | Disposition: A | Discharge status: Home / Self Care | Payer: Medicare Other | Source: Ambulatory Visit | Attending: Internal Medicine | Admitting: Internal Medicine

## 2012-04-23 DIAGNOSIS — M6281 Muscle weakness (generalized): Secondary | ICD-10-CM

## 2012-04-23 DIAGNOSIS — M25519 Pain in unspecified shoulder: Secondary | ICD-10-CM

## 2012-04-23 NOTE — Progress Notes (Signed)
Occupational Therapy Treatment Patient Details  Name: Amy Sawyer MRN: [id number] Date of Birth: [birth year]  Today's Date: 04/23/2012 Time: 4098-1191 OT Time Calculation (min): 41 min Manual Therapy [phone] 22' Therapeutic Exercise [phone] 18'  Visit#: 5  of 16   Re-eval: 05/07/12    Authorization: Medicare   Authorization Time Period: before 10th visit   Authorization Visit#: 5  of 10   Subjective Symptoms/Limitations Symptoms: S: I just want to get my bra and clothes on by myself. Pain Assessment Currently in Pain?: Yes Pain Score:   4 Pain Location: Shoulder Pain Orientation: Left Pain Type: Acute pain  Precautions/Restrictions     Exercise/Treatments Supine Protraction: PROM;10 reps Horizontal ABduction: PROM;10 reps External Rotation: PROM;10 reps Internal Rotation: PROM;10 reps Flexion: PROM;10 reps ABduction: PROM;10 reps Other Supine Exercises: elbow flexion, extension, supination, pronation, wrist flexion, extension x 10 reps in supine Seated Elevation: AROM;12 reps Extension: AROM;12 reps Row: AROM;12 reps Therapy Ball Flexion: 25 reps Flexion Limitations:  (using small green ball; able to complete 50% range.) ABduction: 25 reps ABduction Limitations: using smaller green ball with hand over hand assist, able to complete 25% range. ROM / Strengthening / Isometric Strengthening   Flexion: 3X5" Extension: 3X5" External Rotation: 3X5" Internal Rotation: 3X5" ABduction: 3X5" ADduction: 3X5"         Manual Therapy Manual Therapy: Myofascial release Myofascial Release: MFR and manual stretching to left upper arm, anterior shoulder, posterior shoulder, and trapezius, and elbow region to decrease pain and restrictions and increase pain free PROM in LUE 1435-1457  Occupational Therapy Assessment and Plan OT Assessment and Plan Clinical Impression Statement: A:  Max verbal and tactile cues to keep shoulder depressed with ball stretches or even  just sitting.  Patient's rught shoulder noticable higher from left because of guarded position patient presents with.  Encouraged patient to keep shoulder depressed and allow eblow to rest in the sling to aid with depressing shoulder. OT Plan: P:  Begin AAROM when patient has full PROM.   Goals Short Term Goals Time to Complete Short Term Goals: 4 weeks Short Term Goal 1: Patient will be educated on a HEP. Short Term Goal 1 Progress: Progressing toward goal Short Term Goal 2: Patient will increase left shoulder and elbow PROM to [facility] for increased ability to reach into overhead cabinet. Short Term Goal 3: Patient will increase left shoulder and elbow strength to 3+/5 for increased ability to lift pots and pans when cooking. Short Term Goal 4: Patient will decrease pain in her left shoulder to 2/10 with functional activities. Short Term Goal 4 Progress: Progressing toward goal Short Term Goal 5: Patient will decrease fascial restrictions from mod-max to moderate in her left shoulder region for increased mobility during functional tasks. Long Term Goals Time to Complete Long Term Goals: 8 weeks Long Term Goal 1: Patient will complete all B/IADLs and leisure activiites using LUE as an active assist without pain or restricitons. Long Term Goal 1 Progress: Progressing toward goal Long Term Goal 2: Patient will increase left shoulder and elbow AROM to WNL for increased ability to reach overhead when working in her yard. Long Term Goal 2 Progress: Progressing toward goal Long Term Goal 3: Patient will increase left shoulder and elbow strength to 4+/5 for increased ability to lift pots and potting soil when working in her yard. Long Term Goal 3 Progress: Progressing toward goal Long Term Goal 4: Patient will have 1/10 or less pain in her left shoulder when cooking and cleaning.  Long Term Goal 4 Progress: Progressing toward goal Long Term Goal 5: Patient will have minimal restrictions in her left  shoulder and elbow region. Long Term Goal 5 Progress: Progressing toward goal  Problem List Patient Active Problem List  Diagnosis  . Proximal humerus fracture  . Pain in joint, shoulder region  . Muscle weakness (generalized)    End of Session Activity Tolerance: Patient tolerated treatment well General Behavior During Session: [facility] for tasks performed Cognition: [facility] for tasks performed  GO    Noralee Stain, Camara Renstrom L 04/23/2012, 3:52 PM

## 2012-04-25 ENCOUNTER — Ambulatory Visit (HOSPITAL_COMMUNITY)
Admission: RE | Admit: 2012-04-25 | Discharge: 2012-04-25 | Disposition: A | Discharge status: Home / Self Care | Payer: Medicare Other | Source: Ambulatory Visit | Attending: Internal Medicine | Admitting: Internal Medicine

## 2012-04-25 DIAGNOSIS — M6281 Muscle weakness (generalized): Secondary | ICD-10-CM

## 2012-04-25 DIAGNOSIS — M25519 Pain in unspecified shoulder: Secondary | ICD-10-CM

## 2012-04-25 NOTE — Progress Notes (Signed)
Occupational Therapy Treatment Patient Details  Name: Amy Sawyer MRN: [id number] Date of Birth: 03/12/1931  Today's Date: 04/25/2012 Time: 7829-5621 OT Time Calculation (min): 42 min Manual Therapy 3086-5784 27' Therex 6962-9528 15' Visit#: 6  of 16   Re-eval: 05/07/12    Authorization: Medicare  Authorization Time Period: before 10th visit  Authorization Visit#: 6  of 10   Subjective S:  I may try to put my bra on this weekend.   Limitations: PROM through 04/24/12 - today is 6 weeks postop, discussed discontinuing use of sling at home.   Pain Assessment Currently in Pain?: Yes Pain Score:   3 Pain Location: Shoulder Pain Orientation: Left Pain Type: Acute pain  Precautions/Restrictions  PROM through 04/24/12 (6 weeks post fracture)  Exercise/Treatments Supine Protraction: PROM;AAROM;10 reps Horizontal ABduction: PROM;10 reps External Rotation: PROM;10 reps Internal Rotation: PROM;10 reps Flexion: PROM;10 reps ABduction: PROM;10 reps Seated Elevation: AROM;15 reps Extension: AROM;15 reps Row: AROM;15 reps Other Seated Exercises: elbow flex, ext, sup, pron, wrist flex, ext x 10 AROM Therapy Ball Flexion: 25 reps Flexion Limitations: using smaller green ball with hand over hand assist, able to complete 25% range. ABduction: 25 reps ABduction Limitations: using smaller green ball with hand over hand assist, able to complete 25% range. ROM / Strengthening / Isometric Strengthening   Flexion: 3X5" Extension: 3X5" External Rotation: 3X5" Internal Rotation: 3X5" ABduction: 3X5" ADduction: 3X5"    Manual Therapy Manual Therapy: Myofascial release Myofascial Release: MFR and manual stretching to left upper arm, anterior shoulder, posterior shoulder, and trapezius, and elbow region to decrease pain and restrictions and increase pain free PROM in LUE.  [id number]  Occupational Therapy Assessment and Plan OT Assessment and Plan Clinical Impression Statement: A:  Patient educated that she does not need to wear the sling 24/7. If patient wishes to wear sling out in the community for security she may. Rehab Potential: Good OT Plan: P: Begin AAROM when patient has 80-90% PROM and low pain with PROM.  Attempt ball stretches with less facilitation from OT, and with less vg for technique.   Goals Short Term Goals Time to Complete Short Term Goals: 4 weeks Short Term Goal 1: Patient will be educated on a HEP. Short Term Goal 2: Patient will increase left shoulder and elbow PROM to [facility] for increased ability to reach into overhead cabinet. Short Term Goal 3: Patient will increase left shoulder and elbow strength to 3+/5 for increased ability to lift pots and pans when cooking. Short Term Goal 4: Patient will decrease pain in her left shoulder to 2/10 with functional activities. Short Term Goal 5: Patient will decrease fascial restrictions from mod-max to moderate in her left shoulder region for increased mobility during functional tasks. Long Term Goals Time to Complete Long Term Goals: 8 weeks Long Term Goal 1: Patient will complete all B/IADLs and leisure activiites using LUE as an active assist without pain or restricitons. Long Term Goal 2: Patient will increase left shoulder and elbow AROM to WNL for increased ability to reach overhead when working in her yard. Long Term Goal 3: Patient will increase left shoulder and elbow strength to 4+/5 for increased ability to lift pots and potting soil when working in her yard. Long Term Goal 4: Patient will have 1/10 or less pain in her left shoulder when cooking and cleaning. Long Term Goal 5: Patient will have minimal restrictions in her left shoulder and elbow region.  Problem List Patient Active Problem List  Diagnosis  .  Proximal humerus fracture  . Pain in joint, shoulder region  . Muscle weakness (generalized)    End of Session Activity Tolerance: Patient tolerated treatment well General Behavior  During Session: [facility] for tasks performed Cognition: [facility] for tasks performed   Shirlean Mylar, OTR/L  04/25/2012, 4:28 PM

## 2012-04-30 ENCOUNTER — Ambulatory Visit (HOSPITAL_COMMUNITY)
Admission: RE | Admit: 2012-04-30 | Discharge: 2012-04-30 | Disposition: A | Discharge status: Home / Self Care | Payer: Medicare Other | Source: Ambulatory Visit | Attending: Internal Medicine | Admitting: Internal Medicine

## 2012-04-30 DIAGNOSIS — M6281 Muscle weakness (generalized): Secondary | ICD-10-CM

## 2012-04-30 DIAGNOSIS — M25519 Pain in unspecified shoulder: Secondary | ICD-10-CM

## 2012-04-30 NOTE — Progress Notes (Signed)
Occupational Therapy Treatment Patient Details  Name: Amy Sawyer MRN: [id number] Date of Birth: 1931/04/11  Today's Date: 04/30/2012 Time: 7829-5621 OT Time Calculation (min): 34 min Manual therapy 3086-5784 15' Therapeutic Exercises [id number] 14' Visit#: 7  of 16   Re-eval: 05/07/12    Authorization: Medicare   Authorization Time Period: before 10th visit   Authorization Visit#: 7  of 16   Subjective S:  I peeled some potatoes and used my swiffer broom this weekend. Pain Assessment Currently in Pain?: Yes Pain Score:   2 Pain Location: Shoulder Pain Orientation: Left Pain Type: Acute pain  Precautions/Restrictions   AAROM to begin once PROM of 80-90% is achieved.  Exercise/Treatments Supine Protraction: PROM;10 reps Horizontal ABduction: PROM;10 reps External Rotation: PROM;10 reps Internal Rotation: PROM;10 reps Flexion: PROM;10 reps ABduction: PROM;10 reps Other Supine Exercises: resume next visit Seated Elevation: AROM;15 reps Extension: AROM;15 reps Row: AROM;15 reps Other Seated Exercises: resume next visit Pulleys Flexion: 1 minute Flexion Limitations: required max faciltation for technique ABduction: 1 minute ABduction Limitations: reqired max facilitation for technique Therapy Ball Flexion: 25 reps Flexion Limitations: max facilitation to maintain positioning ABduction: 25 reps ABduction Limitations: max facilitation to maintain positioning. Isometric Strengthening   Flexion:  (3 x 10") Extension:  (3 x 10") External Rotation:  (3 x 10") Internal Rotation:  (3 x 10") ABduction:  (3 x 10") ADduction:  (3 x 10")    Manual Therapy Myofascial Release: MFR and manual stretching to left upper arm, anterior shoulder, posterior shoulder, and trapezius, and elbow region to decrease pain and restrictions and increase pain free PROM in LUE. [id number]   Occupational Therapy Assessment and Plan OT Assessment and Plan Clinical Impression Statement: A:  Omitted AAROM in supine for protraction in error this date, will resume next visit.  Patient tolerating increased PROM today.  Continues to require max facilitation with ball stretches and dowel rod exercises for facilitation of proper movement pattern. OT Plan: P:  Begin AAROM when patient has 80-90% PROM and low pain with PROM. Attempt ball stretches with less facilitation from OT as wel as pulleys, and with less vg for technique   Goals Short Term Goals Time to Complete Short Term Goals: 4 weeks Short Term Goal 1: Patient will be educated on a HEP. Short Term Goal 1 Progress: Progressing toward goal Short Term Goal 2: Patient will increase left shoulder and elbow PROM to [facility] for increased ability to reach into overhead cabinet. Short Term Goal 2 Progress: Progressing toward goal Short Term Goal 3: Patient will increase left shoulder and elbow strength to 3+/5 for increased ability to lift pots and pans when cooking. Short Term Goal 3 Progress: Progressing toward goal Short Term Goal 4: Patient will decrease pain in her left shoulder to 2/10 with functional activities. Short Term Goal 4 Progress: Progressing toward goal Short Term Goal 5: Patient will decrease fascial restrictions from mod-max to moderate in her left shoulder region for increased mobility during functional tasks. Short Term Goal 5 Progress: Progressing toward goal Long Term Goals Time to Complete Long Term Goals: 8 weeks Long Term Goal 1: Patient will complete all B/IADLs and leisure activiites using LUE as an active assist without pain or restricitons. Long Term Goal 1 Progress: Progressing toward goal Long Term Goal 2: Patient will increase left shoulder and elbow AROM to WNL for increased ability to reach overhead when working in her yard. Long Term Goal 2 Progress: Progressing toward goal Long Term Goal 3: Patient will increase  left shoulder and elbow strength to 4+/5 for increased ability to lift pots and potting soil  when working in her yard. Long Term Goal 3 Progress: Progressing toward goal Long Term Goal 4: Patient will have 1/10 or less pain in her left shoulder when cooking and cleaning. Long Term Goal 4 Progress: Progressing toward goal Long Term Goal 5: Patient will have minimal restrictions in her left shoulder and elbow region. Long Term Goal 5 Progress: Progressing toward goal  Problem List Patient Active Problem List  Diagnosis  . Proximal humerus fracture  . Pain in joint, shoulder region  . Muscle weakness (generalized)    End of Session Activity Tolerance: Patient tolerated treatment well General Behavior During Session: [facility] for tasks performed Cognition: [facility] for tasks performed  GO    Shirlean Mylar, OTR/L  04/30/2012, 3:31 PM

## 2012-05-02 ENCOUNTER — Ambulatory Visit (HOSPITAL_COMMUNITY): Payer: Medicare Other | Admitting: Specialist

## 2012-05-09 ENCOUNTER — Ambulatory Visit (HOSPITAL_COMMUNITY)
Admission: RE | Admit: 2012-05-09 | Discharge: 2012-05-09 | Disposition: A | Discharge status: Home / Self Care | Payer: Medicare Other | Source: Ambulatory Visit | Attending: Orthopedic Surgery | Admitting: Orthopedic Surgery

## 2012-05-09 DIAGNOSIS — IMO0001 Reserved for inherently not codable concepts without codable children: Secondary | ICD-10-CM | POA: Diagnosis not present

## 2012-05-09 DIAGNOSIS — M25519 Pain in unspecified shoulder: Secondary | ICD-10-CM

## 2012-05-09 DIAGNOSIS — M6281 Muscle weakness (generalized): Secondary | ICD-10-CM | POA: Insufficient documentation

## 2012-05-09 NOTE — Progress Notes (Addendum)
Occupational Therapy Treatment Patient Details  Name: Amy Sawyer MRN: [id number] Date of Birth: 07/31/1931  Today's Date: 05/09/2012 Time: 4098-1191 OT Time Calculation (min): 37 min Manual Therapy: 4782-956 19' Therapeutic Exercises: 255-313 18'  Visit#: 8  of 16   Re-eval: 05/07/12 Assessment Diagnosis: Left Proximal Humerus Fracture  Authorization: Medicare  Authorization Time Period: before 10th visit   Authorization Visit#: 8  of 16   Subjective Symptoms/Limitations Symptoms: S:  I got a bra on today Pain Assessment Currently in Pain?: No/denies Pain Score:   2 Pain Location: Shoulder Pain Orientation: Left Pain Type: Acute pain  Precautions/Restrictions  Precautions Precautions: Shoulder Type of Shoulder Precautions: PROM x 6 weeks from injury  Exercise/Treatments Supine Protraction: PROM;10 reps Horizontal ABduction: PROM;10 reps External Rotation: PROM;10 reps Internal Rotation: PROM;10 reps Flexion: PROM;10 reps ABduction: PROM;10 reps Seated Elevation: AROM;15 reps Extension: AROM;15 reps Row: AROM;15 reps Other Seated Exercises: resume next visit (patient was in a lot of pain today)    Pulleys Flexion: Limitations (unable to complete due to pain) ABduction: Limitations (unable to start this exercise due to pain) Therapy Ball Flexion: 25 reps Flexion Limitations: max facilitation to maintain positioning ABduction: 25 reps ABduction Limitations: max facilitation to maintain positioning. ROM / Strengthening / Isometric Strengthening   Flexion:  (3x10 ) Extension:  (3x10) External Rotation:  (3x10) Internal Rotation: Limitations (2x10 patient unable to finish exercise due to increased pain) ABduction: Limitations (patient was unable to start exercise due to increased pain) ADduction: Limitations (patient was unable to start exercise due to increased pain)      Manual Therapy Manual Therapy: Myofascial release Myofascial Release: MFR and  manual stretching to left upper arm, anterior shoulder, posterior shoulder, and trapezius, and elbow region to decrease pain and restrictions and increase pain free PROM in LUE  Occupational Therapy Assessment and Plan OT Assessment and Plan Clinical Impression Statement: A: Patient did not complete isometric exercises in supine due to pain.  Patient completed therapy ball exercises with max tactile assistance. Patient completed seated  elevation, depression, and retraction.  Patient declined pulleys today due to increased pain.  Rehab Potential: Good OT Plan: P: Attempt to complete exercises that were unable to be done today due to patients increased pain.    Goals Short Term Goals Time to Complete Short Term Goals: 4 weeks Short Term Goal 1: Patient will be educated on a HEP. Short Term Goal 2: Patient will increase left shoulder and elbow PROM to [facility] for increased ability to reach into overhead cabinet. Short Term Goal 3: Patient will increase left shoulder and elbow strength to 3+/5 for increased ability to lift pots and pans when cooking. Short Term Goal 4: Patient will decrease pain in her left shoulder to 2/10 with functional activities. Short Term Goal 5: Patient will decrease fascial restrictions from mod-max to moderate in her left shoulder region for increased mobility during functional tasks. Long Term Goals Time to Complete Long Term Goals: 8 weeks Long Term Goal 1: Patient will complete all B/IADLs and leisure activiites using LUE as an active assist without pain or restricitons. Long Term Goal 2: Patient will increase left shoulder and elbow AROM to WNL for increased ability to reach overhead when working in her yard. Long Term Goal 3: Patient will increase left shoulder and elbow strength to 4+/5 for increased ability to lift pots and potting soil when working in her yard. Long Term Goal 4: Patient will have 1/10 or less pain in her left shoulder  when cooking and cleaning. Long  Term Goal 5: Patient will have minimal restrictions in her left shoulder and elbow region.  Problem List Patient Active Problem List  Diagnosis  . Proximal humerus fracture  . Pain in joint, shoulder region  . Muscle weakness (generalized)    End of Session Activity Tolerance: Patient tolerated treatment well General Behavior During Session: [facility] for tasks performed Cognition: [facility] for tasks performed  GO    Judi Saa 05/09/2012, 5:30 PM  Note reviewed by clinical instructor and accurately reflects treatment session.

## 2012-05-11 NOTE — Progress Notes (Signed)
Note reviewed by clinical instructor and accurately reflects treatment session.  Dontreal Miera L. Alika Eppes, COTA/L  

## 2012-05-14 ENCOUNTER — Ambulatory Visit (HOSPITAL_COMMUNITY)
Admission: RE | Admit: 2012-05-14 | Discharge: 2012-05-14 | Disposition: A | Discharge status: Home / Self Care | Payer: Medicare Other | Source: Ambulatory Visit | Attending: Internal Medicine | Admitting: Internal Medicine

## 2012-05-14 DIAGNOSIS — M25519 Pain in unspecified shoulder: Secondary | ICD-10-CM

## 2012-05-14 DIAGNOSIS — M6281 Muscle weakness (generalized): Secondary | ICD-10-CM

## 2012-05-14 NOTE — Progress Notes (Signed)
Occupational Therapy Treatment Patient Details  Name: Amy Sawyer MRN: [id number] Date of Birth: 01/29/1931  Today's Date: 05/14/2012 Time: 4098-1191 OT Time Calculation (min): 41 min Manual Therapy 4782-9562 25' Reassessment 1308-6578 16' Visit#: 9  of 16   Re-eval: 06/11/12    Authorization: Medicare  Authorization Time Period: before 19th visit  Authorization Visit#: 9  of 19   Subjective S:  Im doing much better now then I was 2 weeks ago. Special Tests: UEFI was 40% is now 76% Pain Assessment Currently in Pain?: Yes Pain Score:   3 Pain Location: Shoulder Pain Orientation: Left Pain Type: Acute pain  Precautions/Restrictions   Progress to AAROM when PROM is 80%.  Exercise/Treatments Supine Protraction: PROM;10 reps Horizontal ABduction: PROM;10 reps External Rotation: PROM;10 reps Internal Rotation: PROM;10 reps Flexion: PROM;10 reps ABduction: PROM;10 reps Other Supine Exercises: resume next visit Seated Elevation:  (resume next visit) Extension:  (resume next visit) Row:  (resume next visit) Other Seated Exercises: resume next visit Pulleys Flexion: 2 minutes Flexion Limitations: required max faciltation for technique ABduction:  (hold resume next visit) Therapy Ball Flexion: Other (comment) (resume next visit) ABduction: Other (comment) (resume next visit) ROM / Strengthening / Isometric Strengthening   Flexion:  (held all isometric exercises, resume next visit)     Manual Therapy Manual Therapy: Myofascial release Myofascial Release: MFR and manual stretching to left upper arm, anterior shoulder, posterior shoulder, and trapezius, and elbow region to decrease pain and restrictions and increase pain free PROM in LUE [phone]  Occupational Therapy Assessment and Plan OT Assessment and Plan Clinical Impression Statement: A:  Please refer to monthly reassessment  A/PROM in supine flexion 60/123, abduction 58/75, ext rot 25/42, int rot 85  OT Plan:  P:  Continue 2 times a week x 4 weeks to improve mobility and strength in order to reach Mrs. Dec's goal of reaching over her head.  Resume missed exercises.   Goals Short Term Goals Time to Complete Short Term Goals: 4 weeks Short Term Goal 1: Patient will be educated on a HEP. Short Term Goal 1 Progress: Met Short Term Goal 2: Patient will increase left shoulder and elbow PROM to [facility] for increased ability to reach into overhead cabinet. Short Term Goal 2 Progress: Progressing toward goal Short Term Goal 3: Patient will increase left shoulder and elbow strength to 3+/5 for increased ability to lift pots and pans when cooking. Short Term Goal 3 Progress: Progressing toward goal Short Term Goal 4: Patient will decrease pain in her left shoulder to 2/10 with functional activities. Short Term Goal 4 Progress: Met Short Term Goal 5: Patient will decrease fascial restrictions from mod-max to moderate in her left shoulder region for increased mobility during functional tasks. Short Term Goal 5 Progress: Progressing toward goal Long Term Goals Time to Complete Long Term Goals: 8 weeks Long Term Goal 1: Patient will complete all B/IADLs and leisure activiites using LUE as an active assist without pain or restricitons. Long Term Goal 1 Progress: Progressing toward goal Long Term Goal 2: Patient will increase left shoulder and elbow AROM to WNL for increased ability to reach overhead when working in her yard. Long Term Goal 2 Progress: Progressing toward goal Long Term Goal 3: Patient will increase left shoulder and elbow strength to 4+/5 for increased ability to lift pots and potting soil when working in her yard. Long Term Goal 3 Progress: Progressing toward goal Long Term Goal 4: Patient will have 1/10 or less pain in  her left shoulder when cooking and cleaning. Long Term Goal 4 Progress: Progressing toward goal Long Term Goal 5: Patient will have minimal restrictions in her left shoulder and  elbow region. Long Term Goal 5 Progress: Progressing toward goal  Problem List Patient Active Problem List  Diagnosis  . Proximal humerus fracture  . Pain in joint, shoulder region  . Muscle weakness (generalized)    End of Session Activity Tolerance: Patient tolerated treatment well General Behavior During Session: [facility] for tasks performed Cognition: [facility] for tasks performed  GO Functional Assessment Tool Used: UEFI scored 76% I level, 24% disability Functional Limitation: Carrying, moving and handling objects Carrying, Moving and Handling Objects Current Status ([id number]): At least 20 percent but less than 40 percent impaired, limited or restricted Carrying, Moving and Handling Objects Goal Status [phone]): At least 1 percent but less than 20 percent impaired, limited or restricted  Shirlean Mylar, OTR/L  05/14/2012, 4:25 PM

## 2012-05-15 ENCOUNTER — Telehealth: Payer: Self-pay | Admitting: Orthopedic Surgery

## 2012-05-15 NOTE — Telephone Encounter (Signed)
Patient advised.

## 2012-05-15 NOTE — Telephone Encounter (Signed)
Medically advised not to drive while wearing a sling Will probably be 2 weeks

## 2012-05-15 NOTE — Telephone Encounter (Signed)
Amy Sawyer in therapy for her fractured left shoulder, and says she is doing good.  Her next appointment with you is 05/30/12.  She wants to know if she can drive now

## 2012-05-16 ENCOUNTER — Ambulatory Visit (HOSPITAL_COMMUNITY)
Admission: RE | Admit: 2012-05-16 | Discharge: 2012-05-16 | Disposition: A | Discharge status: Home / Self Care | Payer: Medicare Other | Source: Ambulatory Visit | Attending: Internal Medicine | Admitting: Internal Medicine

## 2012-05-16 DIAGNOSIS — M6281 Muscle weakness (generalized): Secondary | ICD-10-CM

## 2012-05-16 DIAGNOSIS — M25519 Pain in unspecified shoulder: Secondary | ICD-10-CM

## 2012-05-16 NOTE — Progress Notes (Signed)
Note reviewed by clinical instructor and accurately reflects treatment session.  Elba Schaber L. Jeremie Giangrande, COTA/L  

## 2012-05-16 NOTE — Progress Notes (Signed)
Occupational Therapy Treatment Patient Details  Name: Amy Sawyer MRN: [id number] Date of Birth: Jan 30, 1931  Today's Date: 05/16/2012 Time: 4098-1191 OT Time Calculation (min): 55 min Manual Therapy: 238-303 25' Therapeutic Exercise: 303-333 30'  Visit#: 10  of 16   Re-eval: 06/11/12    Authorization: Medicare  Authorization Time Period: before 19th visit  Authorization Visit#: 10  of 19   Subjective Symptoms/Limitations Symptoms: S: Im still a little swollen on my arm, but I did put ice on it like you all suggested and I notice it has gone down quite a bit.  Pain Assessment Currently in Pain?: Yes Pain Score:   3 Pain Location: Shoulder Pain Orientation: Left Pain Type: Acute pain  Precautions/Restrictions   Shoulder   Exercise/Treatments Supine Protraction: PROM;10 reps Horizontal ABduction: PROM;10 reps External Rotation: PROM;10 reps Internal Rotation: PROM;10 reps Flexion: PROM;10 reps ABduction: PROM;10 reps Other Supine Exercises: elbow flex, ext, sup, pro, wrist flex, ex, x 10 reps Seated Elevation: AROM;15 reps Extension: AROM;15 reps Row: AROM;15 reps Other Seated Exercises: elbow flex, ext, sup, pro, wrist flex, ext, x 10 Pulleys Flexion: 2 minutes Flexion Limitations: required max faciltation for technique ABduction: Other (comment) (resume next session; did not complete due to pain) ABduction Limitations:  (resume due to pain) Therapy Ball Flexion: 25 reps Flexion Limitations: max facilitation to maintain positioning ABduction: 25 reps ABduction Limitations: max facilitation to maintain positioning. ROM / Strengthening / Isometric Strengthening   Flexion: 3X3" Extension: 3X3" External Rotation: 3X3" Internal Rotation: 3X3" ABduction: 3X3" ADduction: 3X3"        Manual Therapy Manual Therapy: Myofascial release Myofascial Release: MFR and manual stretching to left upper arm, anterior shoulder, posterior shoulder, and trapezius, and  elbow region to decrease pain and restrictions and increase pain free PROM in LUE [phone]  Occupational Therapy Assessment and Plan OT Assessment and Plan Clinical Impression Statement: A: Pt has limited PROM due to pain in upper arm. Pt completed isometric exercises in supine well. Pt completed therapy ball flexion and abduction with minimal ROM; needed constant tactiile faciilitation to keep body straight and scapula depressed. Pt completed flexion on pulleys  with max facilitation . Pt did not complete abduction on pulleys due to increased pain  OT Plan: P: Increase PROM reps for increase stretching and ROM. Continue to work towards increasing ROM with therapy ball stretch and pulleys. Increase pulleys to 2.5' and resume abduction.   Goals Short Term Goals Time to Complete Short Term Goals: 4 weeks Short Term Goal 1: Patient will be educated on a HEP. Short Term Goal 2: Patient will increase left shoulder and elbow PROM to [facility] for increased ability to reach into overhead cabinet. Short Term Goal 3: Patient will increase left shoulder and elbow strength to 3+/5 for increased ability to lift pots and pans when cooking. Short Term Goal 4: Patient will decrease pain in her left shoulder to 2/10 with functional activities. Short Term Goal 5: Patient will decrease fascial restrictions from mod-max to moderate in her left shoulder region for increased mobility during functional tasks. Long Term Goals Time to Complete Long Term Goals: 8 weeks Long Term Goal 1: Patient will complete all B/IADLs and leisure activiites using LUE as an active assist without pain or restricitons. Long Term Goal 2: Patient will increase left shoulder and elbow AROM to WNL for increased ability to reach overhead when working in her yard. Long Term Goal 3: Patient will increase left shoulder and elbow strength to 4+/5 for increased ability  to lift pots and potting soil when working in her yard. Long Term Goal 4: Patient will  have 1/10 or less pain in her left shoulder when cooking and cleaning. Long Term Goal 5: Patient will have minimal restrictions in her left shoulder and elbow region.  Problem List Patient Active Problem List  Diagnosis  . Proximal humerus fracture  . Pain in joint, shoulder region  . Muscle weakness (generalized)    End of Session Activity Tolerance: Patient tolerated treatment well General Behavior During Session: [facility] for tasks performed Cognition: [facility] for tasks performed     Judi Saa, OTAS  05/16/2012, 4:33 PM

## 2012-05-21 ENCOUNTER — Ambulatory Visit (HOSPITAL_COMMUNITY)
Admission: RE | Admit: 2012-05-21 | Discharge: 2012-05-21 | Disposition: A | Discharge status: Home / Self Care | Payer: Medicare Other | Source: Ambulatory Visit | Attending: Internal Medicine | Admitting: Internal Medicine

## 2012-05-21 DIAGNOSIS — M6281 Muscle weakness (generalized): Secondary | ICD-10-CM

## 2012-05-21 DIAGNOSIS — M25519 Pain in unspecified shoulder: Secondary | ICD-10-CM

## 2012-05-21 NOTE — Progress Notes (Signed)
Note reviewed by clinical instructor and accurately reflects treatment session.  Gurley Climer L. [place] Dube, COTA/L  

## 2012-05-21 NOTE — Progress Notes (Signed)
Occupational Therapy Treatment Patient Details  Name: Amy Sawyer MRN: [id number] Date of Birth: 1931-07-28  Today's Date: 05/21/2012 Time: 4098-1191 OT Time Calculation (min): 50 min Manual Therapy: 236-257 21' Therapeutic Exercise: 257-326 19'  Visit#: 11  of 16   Re-eval: 06/11/12    Authorization: Medicare  Authorization Time Period: before 19th visit  Authorization Visit#: 11  of 19   Subjective Symptoms/Limitations Symptoms: S: Im fine; not in any pain. I actually made a beef stew over the weekend and just went slow while I was chopping everything up.  Precautions/Restrictions   Shoulder   Exercise/Treatments Supine Protraction: PROM;10 reps Horizontal ABduction: PROM;10 reps External Rotation: PROM;10 reps Internal Rotation: PROM;10 reps Flexion: PROM;10 reps ABduction: PROM;10 reps Other Supine Exercises: elbow flex, ext, sup, pro, wrist flex, ex, x 10 reps Seated Elevation: AROM;15 reps Extension: AROM;15 reps Row: AROM;15 reps Other Seated Exercises: resume Pulleys Flexion: 2 minutes Flexion Limitations: required max faciltation for technique ABduction: 2 minutes ABduction Limitations: reqired max facilitation for technique Therapy Ball Flexion: 25 reps Flexion Limitations: max facilitation to maintain positioning ABduction: 25 reps ABduction Limitations: max facilitation to maintain positioning. ROM / Strengthening / Isometric Strengthening   Flexion: Other (comment) (resume) Extension: Other (comment) (resume) External Rotation: Other (comment) (resume) Internal Rotation: Other (comment) (resume) ABduction: Other (comment) (resume) ADduction: Other (comment) (resume)     Manual Therapy Manual Therapy: Myofascial release Myofascial Release: MFR and manual stretching to left upper arm, anterior shoulder, posterior shoulder, and trapezius, and elbow region to decrease pain and restrictions and increase pain free PROM  Occupational Therapy  Assessment and Plan OT Assessment and Plan Clinical Impression Statement: A: Pt has limited PROM due to pain in upper arm; we did not increase reps because of this. Pt required max facilitation for therapy ball with constant tactile input for depression of shoulder and proper positioning. Pt  presents max difficulty with proper alingnment for therapy ball and pulley exercises; hiking shoulder and  leaning . Pt was determined to do pulleys correct today and was unable to finish abduction with max  tactile cues  due to pain.  Rehab Potential: Good OT Plan: P: Increase PROM reps to increase range. Add seated scapular depression exercises to increase ROM. Continue to work towards proper alignment during exercises.    Goals Short Term Goals Time to Complete Short Term Goals: 4 weeks Short Term Goal 1: Patient will be educated on a HEP. Short Term Goal 2: Patient will increase left shoulder and elbow PROM to [facility] for increased ability to reach into overhead cabinet. Short Term Goal 3: Patient will increase left shoulder and elbow strength to 3+/5 for increased ability to lift pots and pans when cooking. Short Term Goal 4: Patient will decrease pain in her left shoulder to 2/10 with functional activities. Short Term Goal 5: Patient will decrease fascial restrictions from mod-max to moderate in her left shoulder region for increased mobility during functional tasks. Long Term Goals Time to Complete Long Term Goals: 8 weeks Long Term Goal 1: Patient will complete all B/IADLs and leisure activiites using LUE as an active assist without pain or restricitons. Long Term Goal 2: Patient will increase left shoulder and elbow AROM to WNL for increased ability to reach overhead when working in her yard. Long Term Goal 3: Patient will increase left shoulder and elbow strength to 4+/5 for increased ability to lift pots and potting soil when working in her yard. Long Term Goal 4: Patient will have 1/10  or less pain in  her left shoulder when cooking and cleaning. Long Term Goal 5: Patient will have minimal restrictions in her left shoulder and elbow region.  Problem List Patient Active Problem List  Diagnosis  . Proximal humerus fracture  . Pain in joint, shoulder region  . Muscle weakness (generalized)    End of Session Activity Tolerance: Patient tolerated treatment well General Behavior During Session: [facility] for tasks performed Cognition: [facility] for tasks performed  GO   Judi Saa, OTAS  05/21/2012, 3:35 PM

## 2012-05-23 ENCOUNTER — Ambulatory Visit (HOSPITAL_COMMUNITY)
Admission: RE | Admit: 2012-05-23 | Discharge: 2012-05-23 | Disposition: A | Discharge status: Home / Self Care | Payer: Medicare Other | Source: Ambulatory Visit | Attending: Internal Medicine | Admitting: Internal Medicine

## 2012-05-23 DIAGNOSIS — M6281 Muscle weakness (generalized): Secondary | ICD-10-CM

## 2012-05-23 DIAGNOSIS — M25519 Pain in unspecified shoulder: Secondary | ICD-10-CM

## 2012-05-23 NOTE — Progress Notes (Signed)
Occupational Therapy Treatment Patient Details  Name: Amy Sawyer MRN: [id number] Date of Birth: [birth year]  Today's Date: 05/23/2012 Time: 7846-9629 OT Time Calculation (min): 51 min Manual Therapy 1434-1500 26' Therapeutic Exercises 1500-1525 25' Visit#: 12  of 16   Re-eval: 06/11/12    Authorization: Medicare  Authorization Time Period: before 19th visit   Authorization Visit#: 12  of 19   Subjective S:  I go to the MD next week.  I want to wait and see what he says before I make more appointments.  OTR/L recommended continuing therapy services. Pain Assessment Currently in Pain?: Yes Pain Score:   3 Pain Location: Shoulder Pain Orientation: Left Pain Type: Acute pain  Precautions/Restrictions   Progress as tolerated  Exercise/Treatments Supine Protraction: PROM;AAROM;10 reps Horizontal ABduction: PROM;AAROM;10 reps External Rotation: PROM;AAROM;10 reps Internal Rotation: PROM;AAROM;10 reps Flexion: PROM;AAROM;10 reps ABduction: PROM;AAROM;10 reps Other Supine Exercises: dc Seated Elevation: AROM;15 reps Extension: AROM;15 reps Row: AROM;15 reps Other Seated Exercises: elbow flex, ext, sup, pro, wrist flex, ext, x 10 Therapy Ball Flexion: 20 reps Flexion Limitations: max facilitation to maintain positioning ABduction: 20 reps ABduction Limitations: max facilitation to maintain positioning. ROM / Strengthening / Isometric Strengthening Other ROM/Strengthening Exercises: shoulder arc in seated for increased mobility 12 reps r to l and then 12 reps l to r.  moderate cuing initially to maintain body position and avoid leaning.   Other ROM/Strengthening Exercises: in seated OTR/L held a small ball out in front of patient in varying positions and had patient reach for ball, grab it, and hand it back to therapist x 10 reps.  Reaching into abduction and flexion were the most difficult movements.        Manual Therapy Manual Therapy: Myofascial release Myofascial  Release: MFR and manual stretching to left upper arm, anterior shoulder, posterior shoulder to decrease pain and restrictions and increase pain free mobility.  [id number]  Occupational Therapy Assessment and Plan OT Assessment and Plan Clinical Impression Statement: A: Patient continues to be quite limited in PROM of shoulder.  AAROM in supine and functional AROM exercises in seated were added today to see if patient would gain additional range if she was in control of the movement.  Required tactile cuing for proper positioning during functional range exercises. OT Plan: P:  Await word from MD to continue therapy.  (therapist recommended continuation in progress note last week, however, patient wants to discuss with MD.   Goals Short Term Goals Time to Complete Short Term Goals: 4 weeks Short Term Goal 1: Patient will be educated on a HEP. Short Term Goal 2: Patient will increase left shoulder and elbow PROM to [facility] for increased ability to reach into overhead cabinet. Short Term Goal 3: Patient will increase left shoulder and elbow strength to 3+/5 for increased ability to lift pots and pans when cooking. Short Term Goal 4: Patient will decrease pain in her left shoulder to 2/10 with functional activities. Short Term Goal 5: Patient will decrease fascial restrictions from mod-max to moderate in her left shoulder region for increased mobility during functional tasks. Long Term Goals Time to Complete Long Term Goals: 8 weeks Long Term Goal 1: Patient will complete all B/IADLs and leisure activiites using LUE as an active assist without pain or restricitons. Long Term Goal 2: Patient will increase left shoulder and elbow AROM to WNL for increased ability to reach overhead when working in her yard. Long Term Goal 3: Patient will increase left shoulder and elbow strength to  4+/5 for increased ability to lift pots and potting soil when working in her yard. Long Term Goal 4: Patient will have 1/10 or  less pain in her left shoulder when cooking and cleaning. Long Term Goal 5: Patient will have minimal restrictions in her left shoulder and elbow region.  Problem List Patient Active Problem List  Diagnosis  . Proximal humerus fracture  . Pain in joint, shoulder region  . Muscle weakness (generalized)    End of Session Activity Tolerance: Patient tolerated treatment well General Behavior During Session: [facility] for tasks performed Cognition: [facility] for tasks performed  GO    Shirlean Mylar, OTR/L  05/23/2012, 3:37 PM

## 2012-05-30 ENCOUNTER — Ambulatory Visit (INDEPENDENT_AMBULATORY_CARE_PROVIDER_SITE_OTHER): Payer: Medicare Other | Admitting: Orthopedic Surgery

## 2012-05-30 ENCOUNTER — Encounter: Payer: Self-pay | Admitting: Orthopedic Surgery

## 2012-05-30 VITALS — BP 160/80 | Ht 64.0 in | Wt 131.0 lb

## 2012-05-30 DIAGNOSIS — S42209A Unspecified fracture of upper end of unspecified humerus, initial encounter for closed fracture: Secondary | ICD-10-CM

## 2012-05-30 NOTE — Progress Notes (Signed)
Patient ID: Amy Sawyer, female   DOB: May 18, 1931, 78 y.o.   MRN: [id number] Chief Complaint  Patient presents with  . Follow-up    2 month follow up left shoulder following OT     Left proximal humerus fracture in July 2013   Completed OT   dooing well  PE residual stiffness   D/C

## 2012-05-30 NOTE — Patient Instructions (Addendum)
activities as tolerated 

## 2012-07-20 DIAGNOSIS — I10 Essential (primary) hypertension: Secondary | ICD-10-CM | POA: Diagnosis not present

## 2012-07-20 DIAGNOSIS — F411 Generalized anxiety disorder: Secondary | ICD-10-CM | POA: Diagnosis not present

## 2012-07-20 DIAGNOSIS — Z23 Encounter for immunization: Secondary | ICD-10-CM | POA: Diagnosis not present

## 2012-07-20 DIAGNOSIS — G4700 Insomnia, unspecified: Secondary | ICD-10-CM | POA: Diagnosis not present

## 2012-10-25 DIAGNOSIS — I10 Essential (primary) hypertension: Secondary | ICD-10-CM | POA: Diagnosis not present

## 2012-10-25 DIAGNOSIS — F411 Generalized anxiety disorder: Secondary | ICD-10-CM | POA: Diagnosis not present

## 2012-12-06 DIAGNOSIS — I6789 Other cerebrovascular disease: Secondary | ICD-10-CM | POA: Diagnosis not present

## 2012-12-06 DIAGNOSIS — Z79899 Other long term (current) drug therapy: Secondary | ICD-10-CM | POA: Diagnosis not present

## 2012-12-06 DIAGNOSIS — E785 Hyperlipidemia, unspecified: Secondary | ICD-10-CM | POA: Diagnosis not present

## 2012-12-06 DIAGNOSIS — I10 Essential (primary) hypertension: Secondary | ICD-10-CM | POA: Diagnosis not present

## 2012-12-13 DIAGNOSIS — I10 Essential (primary) hypertension: Secondary | ICD-10-CM | POA: Diagnosis not present

## 2012-12-13 DIAGNOSIS — E785 Hyperlipidemia, unspecified: Secondary | ICD-10-CM | POA: Diagnosis not present

## 2013-04-18 DIAGNOSIS — F411 Generalized anxiety disorder: Secondary | ICD-10-CM | POA: Diagnosis not present

## 2013-04-18 DIAGNOSIS — Z0000 Encounter for general adult medical examination without abnormal findings: Secondary | ICD-10-CM | POA: Diagnosis not present

## 2013-04-18 DIAGNOSIS — I6789 Other cerebrovascular disease: Secondary | ICD-10-CM | POA: Diagnosis not present

## 2013-08-22 DIAGNOSIS — Z23 Encounter for immunization: Secondary | ICD-10-CM | POA: Diagnosis not present

## 2013-08-22 DIAGNOSIS — I10 Essential (primary) hypertension: Secondary | ICD-10-CM | POA: Diagnosis not present

## 2013-08-22 DIAGNOSIS — F411 Generalized anxiety disorder: Secondary | ICD-10-CM | POA: Diagnosis not present

## 2013-12-12 DIAGNOSIS — L600 Ingrowing nail: Secondary | ICD-10-CM | POA: Diagnosis not present

## 2013-12-12 DIAGNOSIS — M79609 Pain in unspecified limb: Secondary | ICD-10-CM | POA: Diagnosis not present

## 2014-02-20 DIAGNOSIS — I6789 Other cerebrovascular disease: Secondary | ICD-10-CM | POA: Diagnosis not present

## 2014-02-20 DIAGNOSIS — I10 Essential (primary) hypertension: Secondary | ICD-10-CM | POA: Diagnosis not present

## 2014-02-20 DIAGNOSIS — E785 Hyperlipidemia, unspecified: Secondary | ICD-10-CM | POA: Diagnosis not present

## 2014-02-20 DIAGNOSIS — Z79899 Other long term (current) drug therapy: Secondary | ICD-10-CM | POA: Diagnosis not present

## 2014-02-27 DIAGNOSIS — I10 Essential (primary) hypertension: Secondary | ICD-10-CM | POA: Diagnosis not present

## 2014-02-27 DIAGNOSIS — N390 Urinary tract infection, site not specified: Secondary | ICD-10-CM | POA: Diagnosis not present

## 2014-03-19 DIAGNOSIS — R319 Hematuria, unspecified: Secondary | ICD-10-CM | POA: Diagnosis not present

## 2014-03-22 ENCOUNTER — Emergency Department (HOSPITAL_COMMUNITY): Payer: Medicare Other

## 2014-03-22 ENCOUNTER — Encounter (HOSPITAL_COMMUNITY): Payer: Self-pay | Admitting: Emergency Medicine

## 2014-03-22 ENCOUNTER — Inpatient Hospital Stay (HOSPITAL_COMMUNITY)
Admission: EM | Admit: 2014-03-22 | Discharge: 2014-03-24 | DRG: 641 | Disposition: A | Discharge status: Home / Self Care | Payer: Medicare Other | Attending: Internal Medicine | Admitting: Internal Medicine

## 2014-03-22 DIAGNOSIS — R109 Unspecified abdominal pain: Secondary | ICD-10-CM | POA: Diagnosis not present

## 2014-03-22 DIAGNOSIS — S0190XA Unspecified open wound of unspecified part of head, initial encounter: Secondary | ICD-10-CM | POA: Diagnosis present

## 2014-03-22 DIAGNOSIS — H9190 Unspecified hearing loss, unspecified ear: Secondary | ICD-10-CM | POA: Diagnosis present

## 2014-03-22 DIAGNOSIS — E46 Unspecified protein-calorie malnutrition: Secondary | ICD-10-CM | POA: Diagnosis not present

## 2014-03-22 DIAGNOSIS — E871 Hypo-osmolality and hyponatremia: Principal | ICD-10-CM

## 2014-03-22 DIAGNOSIS — R55 Syncope and collapse: Secondary | ICD-10-CM | POA: Diagnosis not present

## 2014-03-22 DIAGNOSIS — F411 Generalized anxiety disorder: Secondary | ICD-10-CM | POA: Diagnosis present

## 2014-03-22 DIAGNOSIS — E876 Hypokalemia: Secondary | ICD-10-CM | POA: Diagnosis present

## 2014-03-22 DIAGNOSIS — S0100XA Unspecified open wound of scalp, initial encounter: Secondary | ICD-10-CM | POA: Diagnosis not present

## 2014-03-22 DIAGNOSIS — I519 Heart disease, unspecified: Secondary | ICD-10-CM | POA: Diagnosis not present

## 2014-03-22 DIAGNOSIS — E785 Hyperlipidemia, unspecified: Secondary | ICD-10-CM | POA: Diagnosis present

## 2014-03-22 DIAGNOSIS — I658 Occlusion and stenosis of other precerebral arteries: Secondary | ICD-10-CM | POA: Diagnosis not present

## 2014-03-22 DIAGNOSIS — R110 Nausea: Secondary | ICD-10-CM | POA: Diagnosis not present

## 2014-03-22 DIAGNOSIS — S0990XA Unspecified injury of head, initial encounter: Secondary | ICD-10-CM | POA: Diagnosis not present

## 2014-03-22 DIAGNOSIS — Y92009 Unspecified place in unspecified non-institutional (private) residence as the place of occurrence of the external cause: Secondary | ICD-10-CM | POA: Diagnosis not present

## 2014-03-22 DIAGNOSIS — J449 Chronic obstructive pulmonary disease, unspecified: Secondary | ICD-10-CM | POA: Diagnosis not present

## 2014-03-22 DIAGNOSIS — K5900 Constipation, unspecified: Secondary | ICD-10-CM | POA: Diagnosis present

## 2014-03-22 DIAGNOSIS — W19XXXA Unspecified fall, initial encounter: Secondary | ICD-10-CM | POA: Diagnosis present

## 2014-03-22 DIAGNOSIS — I10 Essential (primary) hypertension: Secondary | ICD-10-CM | POA: Diagnosis not present

## 2014-03-22 DIAGNOSIS — Z833 Family history of diabetes mellitus: Secondary | ICD-10-CM | POA: Diagnosis not present

## 2014-03-22 LAB — CBC WITH DIFFERENTIAL/PLATELET
Basophils Absolute: 0 10*3/uL (ref 0.0–0.1)
Basophils Relative: 1 % (ref 0–1)
EOS ABS: 0.1 10*3/uL (ref 0.0–0.7)
EOS PCT: 1 % (ref 0–5)
HEMATOCRIT: 36.7 % (ref 36.0–46.0)
HEMOGLOBIN: 13.4 g/dL (ref 12.0–15.0)
LYMPHS ABS: 0.8 10*3/uL (ref 0.7–4.0)
Lymphocytes Relative: 19 % (ref 12–46)
MCH: 29.6 pg (ref 26.0–34.0)
MCHC: 36.5 g/dL — ABNORMAL HIGH (ref 30.0–36.0)
MCV: 81.2 fL (ref 78.0–100.0)
MONOS PCT: 13 % — AB (ref 3–12)
Monocytes Absolute: 0.6 10*3/uL (ref 0.1–1.0)
Neutro Abs: 2.9 10*3/uL (ref 1.7–7.7)
Neutrophils Relative %: 66 % (ref 43–77)
Platelets: 206 10*3/uL (ref 150–400)
RBC: 4.52 MIL/uL (ref 3.87–5.11)
RDW: 13.1 % (ref 11.5–15.5)
WBC: 4.4 10*3/uL (ref 4.0–10.5)

## 2014-03-22 LAB — CBC
HEMATOCRIT: 35.2 % — AB (ref 36.0–46.0)
HEMOGLOBIN: 12.8 g/dL (ref 12.0–15.0)
MCH: 29.4 pg (ref 26.0–34.0)
MCHC: 36.4 g/dL — ABNORMAL HIGH (ref 30.0–36.0)
MCV: 80.9 fL (ref 78.0–100.0)
Platelets: 194 10*3/uL (ref 150–400)
RBC: 4.35 MIL/uL (ref 3.87–5.11)
RDW: 13 % (ref 11.5–15.5)
WBC: 4.1 10*3/uL (ref 4.0–10.5)

## 2014-03-22 LAB — COMPREHENSIVE METABOLIC PANEL
ALT: 13 U/L (ref 0–35)
AST: 22 U/L (ref 0–37)
Albumin: 4.2 g/dL (ref 3.5–5.2)
Alkaline Phosphatase: 62 U/L (ref 39–117)
Anion gap: 11 (ref 5–15)
BUN: 10 mg/dL (ref 6–23)
CALCIUM: 9.5 mg/dL (ref 8.4–10.5)
CO2: 28 mEq/L (ref 19–32)
Chloride: 81 mEq/L — ABNORMAL LOW (ref 96–112)
Creatinine, Ser: 0.75 mg/dL (ref 0.50–1.10)
GFR, EST NON AFRICAN AMERICAN: 78 mL/min — AB (ref >90)
GLUCOSE: 111 mg/dL — AB (ref 70–99)
Potassium: 3.3 mEq/L — ABNORMAL LOW (ref 3.7–5.3)
Sodium: 120 mEq/L — CL (ref 137–147)
Total Bilirubin: 0.6 mg/dL (ref 0.3–1.2)
Total Protein: 6.8 g/dL (ref 6.0–8.3)

## 2014-03-22 LAB — CREATININE, SERUM
Creatinine, Ser: 0.72 mg/dL (ref 0.50–1.10)
GFR calc Af Amer: >90 mL/min (ref >90)
GFR calc non Af Amer: 79 mL/min — ABNORMAL LOW (ref >90)

## 2014-03-22 LAB — MAGNESIUM: Magnesium: 2 mg/dL (ref 1.5–2.5)

## 2014-03-22 LAB — PROTIME-INR
INR: 1.05 (ref 0.00–1.49)
PROTHROMBIN TIME: 13.7 s (ref 11.6–15.2)

## 2014-03-22 LAB — TROPONIN I: Troponin I: <0.3 ng/mL (ref <0.30)

## 2014-03-22 LAB — LIPASE, BLOOD: Lipase: 37 U/L (ref 11–59)

## 2014-03-22 MED ORDER — POLYETHYLENE GLYCOL 3350 17 G PO PACK
17.0000 g | PACK | Freq: Two times a day (BID) | ORAL | Status: DC
  Administered 2014-03-24: 17 g via ORAL
  Filled 2014-03-22 (×3): qty 1

## 2014-03-22 MED ORDER — SENNA 8.6 MG PO TABS
1.0000 | ORAL_TABLET | Freq: Two times a day (BID) | ORAL | Status: DC
  Administered 2014-03-22 – 2014-03-23 (×2): 8.6 mg via ORAL
  Filled 2014-03-22 (×4): qty 1

## 2014-03-22 MED ORDER — SODIUM CHLORIDE 0.9 % IJ SOLN
3.0000 mL | Freq: Two times a day (BID) | INTRAMUSCULAR | Status: DC
  Administered 2014-03-22: 3 mL via INTRAVENOUS

## 2014-03-22 MED ORDER — CHLORTHALIDONE 25 MG PO TABS
ORAL_TABLET | ORAL | Status: AC
Start: 2014-03-22 — End: 2014-03-22
  Filled 2014-03-22: qty 1

## 2014-03-22 MED ORDER — RAMIPRIL 1.25 MG PO CAPS
1.2500 mg | ORAL_CAPSULE | Freq: Every day | ORAL | Status: DC
  Administered 2014-03-23 – 2014-03-24 (×2): 1.25 mg via ORAL
  Filled 2014-03-22 (×5): qty 1

## 2014-03-22 MED ORDER — IOHEXOL 300 MG/ML  SOLN
100.0000 mL | Freq: Once | INTRAMUSCULAR | Status: AC | PRN
  Administered 2014-03-22: 100 mL via INTRAVENOUS

## 2014-03-22 MED ORDER — RAMIPRIL 1.25 MG PO CAPS
ORAL_CAPSULE | ORAL | Status: AC
  Filled 2014-03-22: qty 1

## 2014-03-22 MED ORDER — CHLORTHALIDONE 25 MG PO TABS
25.0000 mg | ORAL_TABLET | Freq: Every day | ORAL | Status: DC
  Administered 2014-03-23: 25 mg via ORAL
  Filled 2014-03-22 (×5): qty 1

## 2014-03-22 MED ORDER — SODIUM CHLORIDE 0.9 % IV BOLUS (SEPSIS)
500.0000 mL | Freq: Once | INTRAVENOUS | Status: AC
  Administered 2014-03-22: 500 mL via INTRAVENOUS

## 2014-03-22 MED ORDER — IOHEXOL 300 MG/ML  SOLN
50.0000 mL | Freq: Once | INTRAMUSCULAR | Status: AC | PRN
  Administered 2014-03-22: 50 mL via ORAL

## 2014-03-22 MED ORDER — BACITRACIN ZINC 500 UNIT/GM EX OINT
TOPICAL_OINTMENT | Freq: Two times a day (BID) | CUTANEOUS | Status: DC
  Administered 2014-03-22 – 2014-03-24 (×3): 1 via TOPICAL
  Filled 2014-03-22 (×3): qty 0.9

## 2014-03-22 MED ORDER — HEPARIN SODIUM (PORCINE) 5000 UNIT/ML IJ SOLN
5000.0000 [IU] | Freq: Three times a day (TID) | INTRAMUSCULAR | Status: DC
  Administered 2014-03-22 – 2014-03-24 (×5): 5000 [IU] via SUBCUTANEOUS
  Filled 2014-03-22 (×5): qty 1

## 2014-03-22 MED ORDER — CLONAZEPAM 0.5 MG PO TABS: 0.2500 mg | ORAL_TABLET | Freq: Two times a day (BID) | ORAL | Status: DC | PRN

## 2014-03-22 MED ORDER — ZOLPIDEM TARTRATE 5 MG PO TABS
5.0000 mg | ORAL_TABLET | Freq: Every day | ORAL | Status: DC
  Administered 2014-03-22 – 2014-03-23 (×2): 5 mg via ORAL
  Filled 2014-03-22 (×2): qty 1

## 2014-03-22 MED ORDER — ONDANSETRON HCL 4 MG/2ML IJ SOLN
INTRAMUSCULAR | Status: AC
  Filled 2014-03-22: qty 2

## 2014-03-22 MED ORDER — SODIUM CHLORIDE 0.9 % IV SOLN
INTRAVENOUS | Status: DC
  Administered 2014-03-22 – 2014-03-23 (×3): via INTRAVENOUS

## 2014-03-22 MED ORDER — ONDANSETRON HCL 4 MG/2ML IJ SOLN
4.0000 mg | Freq: Once | INTRAMUSCULAR | Status: AC
  Administered 2014-03-22: 4 mg via INTRAVENOUS

## 2014-03-22 MED ORDER — TETANUS-DIPHTH-ACELL PERTUSSIS 5-2.5-18.5 LF-MCG/0.5 IM SUSP
0.5000 mL | Freq: Once | INTRAMUSCULAR | Status: DC
  Filled 2014-03-22: qty 0.5

## 2014-03-22 MED ORDER — SIMVASTATIN 20 MG PO TABS
40.0000 mg | ORAL_TABLET | Freq: Every day | ORAL | Status: DC
  Administered 2014-03-23: 40 mg via ORAL
  Filled 2014-03-22: qty 2

## 2014-03-22 MED ORDER — POTASSIUM CHLORIDE CRYS ER 20 MEQ PO TBCR
40.0000 meq | EXTENDED_RELEASE_TABLET | Freq: Two times a day (BID) | ORAL | Status: DC
  Administered 2014-03-22 – 2014-03-23 (×3): 40 meq via ORAL
  Filled 2014-03-22 (×4): qty 2

## 2014-03-22 NOTE — ED Notes (Addendum)
CRITICAL VALUE ALERT  Critical value received:  Na+-120  Date of notification: 03/19/2014  Time of notification:  9211  Critical value read back:Yes.    Nurse who received alert:  Ernestene Mention  Responding MD:  Dr.Polina  Time MD responded: 1540

## 2014-03-22 NOTE — H&P (Signed)
[facility] Admission History and Physical   Patient name: Amy Sawyer Medical record number: [id number] Date of birth: 09/09/1930 Age: 80 y.o. Gender: female  Primary Care Provider: Asencion Noble, MD Consultants: none Code Status: Full  Chief Complaint: syncope  Assessment and Plan: Amy Sawyer is a 80 y.o. female presenting with hyponatremia and syncopal episodes . PMH is significant for HTN, HLD.  Hyponatremia: Na on admission 120. Pt w/o h/o hyponatremia. Likely chronic for pt. From very low sodium diet w/o many prepared foods and high free water intake. No evidence of cardiac involvement. Unlikely SIADH, CHF, Cirrhosis, polydypsia, or endocrine related. Correction goal is 38mEq/L in 24 hrs and staying less than 84mEq. - Admit to telemetry - Urine and serum osmol, urine NA -  Correction initially will be through limiting free water intake to less than 1L/day and starting NS at 155ml/hr - BMET Q6  Syncope: orthostatic instability vs hyponatremia. Unlikely cardiac arrhythmia or neorlogic etiology. No evidence of AS. Head CT unremarkable - no evidence of bleed.  - Tele - Orthostatic VS   Head laceration: already healing by secondary intention.  - bacitracin ointment  Constipation: intermittent problem for pt. Reports last BM 9 days ago. Straining on toilet 2 wks ago caused pt to vagal. - Miralax 1 cap BID - Senokot BID - consider enema if needed  FEN/GI: tolerating po. Hypokalemia likely diet related - regular diet (w/ free water restriction) - Kdur 75mEq x1 - Mg level  Prophylaxis: Hep Dubois TID  Disposition: pending improvement.   History of Present Illness: Amy Sawyer is a 80 y.o. female presenting with 2 episodes of syncope. First episode 2 wks ago while sitting on comode and trying to have a BM. 2nd episode occurred last night. Got up from computer and passed out. Witnessed by granddaughter. Hit head on fan and caused a slight cut. Pt describes the  symptoms of both syncopal episodes as the same. Denies fevers, n/v, palpitations, CP, SOB, dysuria, or frequency.   Typical daily diet of cereal, no lunch, boiled vegetables, little to no salt in food prep or diet, vegetarian (accept for chicken), drinks mostly sweet tea and water, and the occasional soda.   Review Of Systems: Per HPI with the following additions: none Otherwise 12 point review of systems was performed and was unremarkable.  Patient Active Problem List   Diagnosis Date Noted  . Hyponatremia 03/22/2014  . Pain in joint, shoulder region 04/09/2012  . Muscle weakness (generalized) 04/09/2012  . Proximal humerus fracture 03/13/2012   Past Medical History: Past Medical History  Diagnosis Date  . Hyperlipemia   . HTN (hypertension)    Past Surgical History: Past Surgical History  Procedure Laterality Date  . Breast surgery    . Leg surgery     Social History: History  Substance Use Topics  . Smoking status: Never Smoker   . Smokeless tobacco: Not on file  . Alcohol Use: No   Additional social history: none  Please also refer to relevant sections of EMR.  Family History: Family History  Problem Relation Age of Onset  . Heart disease    . Diabetes     Allergies and Medications: Allergies  Allergen Reactions  . Hydrocodone-Acetaminophen Nausea And Vomiting   No current facility-administered medications on file prior to encounter.   Current Outpatient Prescriptions on File Prior to Encounter  Medication Sig Dispense Refill  . Ramipril (ALTACE PO) Take 1 capsule by mouth daily.       Marland Kitchen  Simvastatin (ZOCOR PO) Take 1 tablet by mouth at bedtime.         Objective: BP 131/108  Pulse 63  Temp(Src) 98 F (36.7 C) (Oral)  Resp 25  Ht 5\' 4"  (1.626 m)  Wt 59.875 kg (132 lb)  BMI 22.65 kg/m2  SpO2 99% Exam: General: thin and frail. Active, hard of hearing HEENT: dry mm, EOMi, PERRL Cardiovascular: RRR, no m/r/g, Carrotid arteries w/o bruit or murmur, 2+  pulese Respiratory: CTAB, nml effort Abdomen: NABS, soft Extremities: 2+ pulses, trace LE edema Skin: intact, no rash Neuro: CN2-12 grossly intact. AAOx3  Labs and Imaging: Results for orders placed during the hospital encounter of 03/22/14 (from the past 24 hour(s))  CBC WITH DIFFERENTIAL     Status: Abnormal   Collection Time    03/22/14  2:56 PM      Result Value Ref Range   WBC 4.4  4.0 - 10.5 K/uL   RBC 4.52  3.87 - 5.11 MIL/uL   Hemoglobin 13.4  12.0 - 15.0 g/dL   HCT 36.7  36.0 - 46.0 %   MCV 81.2  78.0 - 100.0 fL   MCH 29.6  26.0 - 34.0 pg   MCHC 36.5 (*) 30.0 - 36.0 g/dL   RDW 13.1  11.5 - 15.5 %   Platelets 206  150 - 400 K/uL   Neutrophils Relative % 66  43 - 77 %   Neutro Abs 2.9  1.7 - 7.7 K/uL   Lymphocytes Relative 19  12 - 46 %   Lymphs Abs 0.8  0.7 - 4.0 K/uL   Monocytes Relative 13 (*) 3 - 12 %   Monocytes Absolute 0.6  0.1 - 1.0 K/uL   Eosinophils Relative 1  0 - 5 %   Eosinophils Absolute 0.1  0.0 - 0.7 K/uL   Basophils Relative 1  0 - 1 %   Basophils Absolute 0.0  0.0 - 0.1 K/uL  COMPREHENSIVE METABOLIC PANEL     Status: Abnormal   Collection Time    03/22/14  2:56 PM      Result Value Ref Range   Sodium 120 (*) 137 - 147 mEq/L   Potassium 3.3 (*) 3.7 - 5.3 mEq/L   Chloride 81 (*) 96 - 112 mEq/L   CO2 28  19 - 32 mEq/L   Glucose, Bld 111 (*) 70 - 99 mg/dL   BUN 10  6 - 23 mg/dL   Creatinine, Ser 0.75  0.50 - 1.10 mg/dL   Calcium 9.5  8.4 - 10.5 mg/dL   Total Protein 6.8  6.0 - 8.3 g/dL   Albumin 4.2  3.5 - 5.2 g/dL   AST 22  0 - 37 U/L   ALT 13  0 - 35 U/L   Alkaline Phosphatase 62  39 - 117 U/L   Total Bilirubin 0.6  0.3 - 1.2 mg/dL   GFR calc non Af Amer 78 (*) >90 mL/min   GFR calc Af Amer >90  >90 mL/min   Anion gap 11  5 - 15  TROPONIN I     Status: None   Collection Time    03/22/14  2:56 PM      Result Value Ref Range   Troponin I <0.30  <0.30 ng/mL  PROTIME-INR     Status: None   Collection Time    03/22/14  2:56 PM      Result  Value Ref Range   Prothrombin Time 13.7  11.6 - 15.2 seconds  INR 1.05  0.00 - 1.49  LIPASE, BLOOD     Status: None   Collection Time    03/22/14  2:56 PM      Result Value Ref Range   Lipase 37  11 - 59 U/L    Ct Head Wo Contrast  03/22/2014   CLINICAL DATA:  80 year old who fell, striking the back of the head earlier today. Acute mental status changes.  EXAM: CT HEAD WITHOUT CONTRAST  TECHNIQUE: Contiguous axial images were obtained from the base of the skull through the vertex without intravenous contrast.  COMPARISON:  06/07/2004.  FINDINGS: Moderate cortical and deep atrophy and mild cerebellar atrophy, only minimally progressive since the prior examination. Mild changes of small vessel disease of the white matter, unchanged. Ventricular system normal in size and appearance for age. No mass lesion. No midline shift. No acute hemorrhage or hematoma. No extra-axial fluid collections. No evidence of acute infarction.  No skull fracture or other focal osseous abnormality involving the skull. Visualized paranasal sinuses, bilateral mastoid air cells and bilateral middle ear cavities well-aerated. Bilateral carotid siphon and left vertebral artery atherosclerosis.  IMPRESSION: 1. No acute intracranial abnormality. 2. Stable moderate generalized atrophy and mild chronic microvascular ischemic changes of the white matter.   Electronically Signed   By: Evangeline Dakin M.D.   On: 03/22/2014 16:11   Ct Abdomen Pelvis W Contrast  03/22/2014   CLINICAL DATA:  Upper abdominal pain with nausea and lack of bowel movements for 10 days. Hypertension.  EXAM: CT ABDOMEN AND PELVIS WITH CONTRAST  TECHNIQUE: Multidetector CT imaging of the abdomen and pelvis was performed using the standard protocol following bolus administration of intravenous contrast.  CONTRAST:  167mL OMNIPAQUE IOHEXOL 300 MG/ML SOLN, 53mL OMNIPAQUE IOHEXOL 300 MG/ML SOLN  COMPARISON:  None.  FINDINGS: Lower chest: Mild motion degradation. Mild  centrilobular emphysema. Mild cardiomegaly.  Liver:  Well-circumscribed liver lesions which are likely cysts.  Spleen: Normal  Stomach: Normal, without wall thickening.  Pancreas: Normal, without mass or pancreatic ductal dilatation.  Gallbladder/Biliary Tree: Normal gallbladder, without intra or extrahepatic biliary ductal dilatation.  Kidneys/Adrenals: Normal adrenal glands. Bilateral renal cysts. The largest is off the lower pole left kidney and measures 6.9 cm.  Bowel loops: Scattered colonic diverticula. Normal terminal ileum and appendix. Normal small bowel without abdominal ascites.  Vascular: Dense aortic and branch vessel atherosclerosis  Nodes: No retroperitoneal or retrocrural adenopathy. No pelvic adenopathy.  Pelvic Genitourinary: Normal urinary bladder and uterus. No adnexal mass.  Other: No significant free fluid.  Bones/Musculoskeletal: Moderate osteopenia. Convex right lumbar spine curvature.  IMPRESSION: No acute process in the abdomen or pelvis.   Electronically Signed   By: Abigail Miyamoto M.D.   On: 03/22/2014 16:15   Dg Chest Port 1 View  03/22/2014   CLINICAL DATA:  Syncope.  Weakness.  EXAM: PORTABLE CHEST - 1 VIEW  COMPARISON:  CT chest 06/07/2004.  FINDINGS: Cardiac silhouette upper normal in size 2 borderline enlarged for AP portable technique, unchanged. Thoracic aorta tortuous and atherosclerotic, unchanged. Hilar and mediastinal contours otherwise unremarkable. Emphysematous changes in the upper lobes. Prominent bronchovascular markings diffusely and mild to moderate central peribronchial thickening, unchanged. Lungs otherwise clear. No localized airspace consolidation. No pleural effusions. No pneumothorax. Normal pulmonary vascularity.  IMPRESSION: Stable borderline heart size. Stable COPD (chronic bronchitis) and emphysema. No acute cardiopulmonary disease.   Electronically Signed   By: Evangeline Dakin M.D.   On: 03/22/2014 15:29     Waldemar Dickens, MD  Family  Medicine 03/22/2014, 6:08 PM [place]

## 2014-03-22 NOTE — ED Provider Notes (Signed)
Patient seen initially by Doctor Ray and signed out to me. Patient has had 2 syncopal episodes in the last week. She had an episode today where she completely blacked out, fell and hit her head. She presented with a small laceration. CT of head was unremarkable, and no acute injury and no abnormality to explain the syncopal episodes.  Patient also complaining of abdominal discomfort, distention, constipation. CT scan of abdomen, however, unremarkable. No evidence of obstruction.  Patient's blood work does show profound hyponatremia. She is unaware of previous hyponatremia, no previous labs to compare here. This is likely the cause of her weakness, dizziness and 2 syncopal episodes. She will require hospitalization for further management.  Orpah Greek, MD 03/22/14 [phone]

## 2014-03-22 NOTE — ED Provider Notes (Signed)
CSN: [id number]     Arrival date & time 03/22/14  1410 History   First MD Initiated Contact with Patient 03/22/14 1429     Chief Complaint  Patient presents with  . Head Injury    Level V caveat secondary to patient being extremely hard of hearing. (Consider location/radiation/quality/duration/timing/severity/associated sxs/prior Treatment) HPI  This is an 80 year old female with a history of hypertension and hyperlipidemia who presents today after a syncopal episode last night. She states that she has not felt well for several weeks with diffuse upper abdominal pain, constipation, now with decreased appetite. During that time she has had 2 episodes of syncope. That she was sitting at computer when she felt very lightheaded attempted to move to a different chair and then fell striking the left head.  She may have had a short loc but husband came in soon and found her awake.  She had some bleeding with this. He was controlled by her husband and he put on an antibiotic ointment. She is continued to have some problems with rebleeding today and came in secondary to this. She has described the abdominal pain has constipation. She points to the upper abdomen and it has increased with by mouth intake.  She describes a previous episode of syncope earlier in the week.   Past Medical History  Diagnosis Date  . Hyperlipemia   . HTN (hypertension)    Past Surgical History  Procedure Laterality Date  . Breast surgery    . Leg surgery     Family History  Problem Relation Age of Onset  . Heart disease    . Diabetes     History  Substance Use Topics  . Smoking status: Never Smoker   . Smokeless tobacco: Not on file  . Alcohol Use: No   OB History   Grav Para Term Preterm Abortions TAB SAB Ect Mult Living                 Review of Systems  Unable to perform ROS Constitutional: Negative for fever.  HENT: Negative.   Eyes: Negative.   Respiratory: Negative.   Cardiovascular: Negative.     Gastrointestinal: Positive for abdominal pain and constipation.      Allergies  Hydrocodone-acetaminophen  Home Medications   Prior to Admission medications   Medication Sig Start Date End Date Taking? Authorizing Provider  CLONAZEPAM PO Take by mouth.    Historical Provider, MD  promethazine (PHENERGAN) 12.5 MG tablet Take 12.5 mg by mouth every 6 (six) hours as needed. 03/19/12   Carole Civil, MD  Ramipril (ALTACE PO) Take by mouth.    Historical Provider, MD  Simvastatin (ZOCOR PO) Take by mouth.    Historical Provider, MD   BP 131/68  Pulse 77  Temp(Src) 98 F (36.7 C) (Oral)  Resp 16  Ht 5\' 4"  (1.626 m)  Wt 132 lb (59.875 kg)  BMI 22.65 kg/m2  SpO2 99% Physical Exam  Nursing note and vitals reviewed. Constitutional: She is oriented to person, place, and time. She appears well-developed and well-nourished.  HENT:  Head: Normocephalic.    Right Ear: External ear normal.  Left Ear: External ear normal.  Nose: Nose normal.  Mouth/Throat: Oropharynx is clear and moist.  Patient with .25 cm laceration left parietoccipital area- see diagram  Eyes: Conjunctivae and EOM are normal. Pupils are equal, round, and reactive to light.  Neck: Normal range of motion. Neck supple.  Cardiovascular: Normal rate, regular rhythm, normal heart sounds and intact  distal pulses.   Pulmonary/Chest: Effort normal and breath sounds normal.  Abdominal: Soft. Bowel sounds are normal.  Musculoskeletal: Normal range of motion. She exhibits no edema.  Neurological: She is alert and oriented to person, place, and time. She has normal reflexes. She displays normal reflexes. No cranial nerve deficit. She exhibits normal muscle tone. Coordination normal.  Skin: Skin is warm and dry.  Psychiatric: She has a normal mood and affect.    ED Course  Procedures (including critical care time) Labs Review Labs Reviewed  CBC WITH DIFFERENTIAL  COMPREHENSIVE METABOLIC PANEL  TROPONIN I   PROTIME-INR  LIPASE, BLOOD    Imaging Review No results found.   EKG Interpretation None      MDM   1- syncope 2- abdominal pain 3-head laceration   Shaune Pollack, MD 03/22/14 [phone]

## 2014-03-22 NOTE — ED Notes (Signed)
Attempted to call report, floor states room assignment is going to be changed.

## 2014-03-22 NOTE — ED Notes (Signed)
Pt states she just had a tetanus shot about a year ago, defiantly less than 5 years

## 2014-03-22 NOTE — ED Notes (Signed)
Pt got dizzy while sitting at the computer and was getting up out of chair, got dizzy andpassed out, hitting her head. Also states last bowel movement was 10 days.

## 2014-03-23 ENCOUNTER — Inpatient Hospital Stay (HOSPITAL_COMMUNITY): Payer: Medicare Other

## 2014-03-23 DIAGNOSIS — I519 Heart disease, unspecified: Secondary | ICD-10-CM

## 2014-03-23 DIAGNOSIS — R55 Syncope and collapse: Secondary | ICD-10-CM

## 2014-03-23 LAB — BASIC METABOLIC PANEL
Anion gap: 13 (ref 5–15)
Anion gap: 12 (ref 5–15)
BUN: 8 mg/dL (ref 6–23)
BUN: 7 mg/dL (ref 6–23)
CHLORIDE: 87 meq/L — AB (ref 96–112)
CO2: 24 mEq/L (ref 19–32)
CO2: 25 meq/L (ref 19–32)
CREATININE: 0.68 mg/dL (ref 0.50–1.10)
CREATININE: 0.66 mg/dL (ref 0.50–1.10)
Calcium: 9.2 mg/dL (ref 8.4–10.5)
Calcium: 8.9 mg/dL (ref 8.4–10.5)
Chloride: 92 mEq/L — ABNORMAL LOW (ref 96–112)
GFR calc Af Amer: >90 mL/min (ref >90)
GFR calc Af Amer: >90 mL/min (ref >90)
GFR calc non Af Amer: 80 mL/min — ABNORMAL LOW (ref >90)
GFR calc non Af Amer: 81 mL/min — ABNORMAL LOW (ref >90)
GLUCOSE: 85 mg/dL (ref 70–99)
GLUCOSE: 89 mg/dL (ref 70–99)
POTASSIUM: 3.7 meq/L (ref 3.7–5.3)
POTASSIUM: 3.5 meq/L — AB (ref 3.7–5.3)
Sodium: 124 mEq/L — ABNORMAL LOW (ref 137–147)
Sodium: 129 mEq/L — ABNORMAL LOW (ref 137–147)

## 2014-03-23 LAB — NA AND K (SODIUM & POTASSIUM), RAND UR
POTASSIUM UR: 13 meq/L
SODIUM UR: 69 meq/L

## 2014-03-23 LAB — OSMOLALITY, URINE: Osmolality, Ur: 222 mOsm/kg — ABNORMAL LOW (ref 390–1090)

## 2014-03-23 LAB — OSMOLALITY: Osmolality: 254 mOsm/kg — ABNORMAL LOW (ref 275–300)

## 2014-03-23 NOTE — Progress Notes (Signed)
Echocardiogram 2D Echocardiogram has been performed.  Amy Sawyer 03/23/2014, 2:41 PM

## 2014-03-23 NOTE — Progress Notes (Signed)
Subjective: Patient was admitted yesterday due to syncopal episode. She was found to have hyponatremia. She is feeling better today.  Objective: Vital signs in last 24 hours: Temp:  [97.5 F (36.4 C)-98.6 F (37 C)] 98.3 F (36.8 C) (07/19 0657) Pulse Rate:  [60-77] 60 (07/19 0657) Resp:  [14-25] 18 (07/19 0657) BP: (108-138)/(48-108) 108/52 mmHg (07/19 0657) SpO2:  [97 %-100 %] 97 % (07/19 0657) Weight:  [59.875 kg (132 lb)] 59.875 kg (132 lb) (07/18 1846) Weight change:  Last BM Date: 03/22/14  Intake/Output from previous day: 07/18 0701 - 07/19 0700 In: 950 [I.V.:950] Out: 850 [Urine:850]  PHYSICAL EXAM General appearance: alert and no distress Resp: clear to auscultation bilaterally Cardio: S1, S2 normal GI: soft, non-tender; bowel sounds normal; no masses,  no organomegaly Extremities: extremities normal, atraumatic, no cyanosis or edema  Lab Results:  Results for orders placed during the hospital encounter of 03/22/14 (from the past 48 hour(s))  CBC WITH DIFFERENTIAL     Status: Abnormal   Collection Time    03/22/14  2:56 PM      Result Value Ref Range   WBC 4.4  4.0 - 10.5 K/uL   RBC 4.52  3.87 - 5.11 MIL/uL   Hemoglobin 13.4  12.0 - 15.0 g/dL   HCT 36.7  36.0 - 46.0 %   MCV 81.2  78.0 - 100.0 fL   MCH 29.6  26.0 - 34.0 pg   MCHC 36.5 (*) 30.0 - 36.0 g/dL   RDW 13.1  11.5 - 15.5 %   Platelets 206  150 - 400 K/uL   Neutrophils Relative % 66  43 - 77 %   Neutro Abs 2.9  1.7 - 7.7 K/uL   Lymphocytes Relative 19  12 - 46 %   Lymphs Abs 0.8  0.7 - 4.0 K/uL   Monocytes Relative 13 (*) 3 - 12 %   Monocytes Absolute 0.6  0.1 - 1.0 K/uL   Eosinophils Relative 1  0 - 5 %   Eosinophils Absolute 0.1  0.0 - 0.7 K/uL   Basophils Relative 1  0 - 1 %   Basophils Absolute 0.0  0.0 - 0.1 K/uL  COMPREHENSIVE METABOLIC PANEL     Status: Abnormal   Collection Time    03/22/14  2:56 PM      Result Value Ref Range   Sodium 120 (*) 137 - 147 mEq/L   Comment: CRITICAL  RESULT CALLED TO, READ BACK BY AND VERIFIED WITH:     MILLER,M AT 1539 BY GODFREY,O ON 03/22/14   Potassium 3.3 (*) 3.7 - 5.3 mEq/L   Chloride 81 (*) 96 - 112 mEq/L   CO2 28  19 - 32 mEq/L   Glucose, Bld 111 (*) 70 - 99 mg/dL   BUN 10  6 - 23 mg/dL   Creatinine, Ser 0.75  0.50 - 1.10 mg/dL   Calcium 9.5  8.4 - 10.5 mg/dL   Total Protein 6.8  6.0 - 8.3 g/dL   Albumin 4.2  3.5 - 5.2 g/dL   AST 22  0 - 37 U/L   ALT 13  0 - 35 U/L   Alkaline Phosphatase 62  39 - 117 U/L   Total Bilirubin 0.6  0.3 - 1.2 mg/dL   GFR calc non Af Amer 78 (*) >90 mL/min   GFR calc Af Amer >90  >90 mL/min   Comment: (NOTE)     The eGFR has been calculated using the CKD EPI equation.  This calculation has not been validated in all clinical situations.     eGFR's persistently <90 mL/min signify possible Chronic Kidney     Disease.   Anion gap 11  5 - 15  TROPONIN I     Status: None   Collection Time    03/22/14  2:56 PM      Result Value Ref Range   Troponin I <0.30  <0.30 ng/mL   Comment:            Due to the release kinetics of cTnI,     a negative result within the first hours     of the onset of symptoms does not rule out     myocardial infarction with certainty.     If myocardial infarction is still suspected,     repeat the test at appropriate intervals.  PROTIME-INR     Status: None   Collection Time    03/22/14  2:56 PM      Result Value Ref Range   Prothrombin Time 13.7  11.6 - 15.2 seconds   INR 1.05  0.00 - 1.49  LIPASE, BLOOD     Status: None   Collection Time    03/22/14  2:56 PM      Result Value Ref Range   Lipase 37  11 - 59 U/L  CBC     Status: Abnormal   Collection Time    03/22/14  7:48 PM      Result Value Ref Range   WBC 4.1  4.0 - 10.5 K/uL   RBC 4.35  3.87 - 5.11 MIL/uL   Hemoglobin 12.8  12.0 - 15.0 g/dL   HCT 35.2 (*) 36.0 - 46.0 %   MCV 80.9  78.0 - 100.0 fL   MCH 29.4  26.0 - 34.0 pg   MCHC 36.4 (*) 30.0 - 36.0 g/dL   RDW 13.0  11.5 - 15.5 %   Platelets 194   150 - 400 K/uL  CREATININE, SERUM     Status: Abnormal   Collection Time    03/22/14  7:48 PM      Result Value Ref Range   Creatinine, Ser 0.72  0.50 - 1.10 mg/dL   GFR calc non Af Amer 79 (*) >90 mL/min   GFR calc Af Amer >90  >90 mL/min   Comment: (NOTE)     The eGFR has been calculated using the CKD EPI equation.     This calculation has not been validated in all clinical situations.     eGFR's persistently <90 mL/min signify possible Chronic Kidney     Disease.  MAGNESIUM     Status: None   Collection Time    03/22/14  7:48 PM      Result Value Ref Range   Magnesium 2.0  1.5 - 2.5 mg/dL  BASIC METABOLIC PANEL     Status: Abnormal   Collection Time    03/23/14  1:32 AM      Result Value Ref Range   Sodium 124 (*) 137 - 147 mEq/L   Potassium 3.7  3.7 - 5.3 mEq/L   Chloride 87 (*) 96 - 112 mEq/L   CO2 24  19 - 32 mEq/L   Glucose, Bld 85  70 - 99 mg/dL   BUN 8  6 - 23 mg/dL   Creatinine, Ser 0.68  0.50 - 1.10 mg/dL   Calcium 9.2  8.4 - 10.5 mg/dL   GFR calc non Af Amer 80 (*) >90  mL/min   GFR calc Af Amer >90  >90 mL/min   Comment: (NOTE)     The eGFR has been calculated using the CKD EPI equation.     This calculation has not been validated in all clinical situations.     eGFR's persistently <90 mL/min signify possible Chronic Kidney     Disease.   Anion gap 13  5 - 15  BASIC METABOLIC PANEL     Status: Abnormal   Collection Time    03/23/14  6:48 AM      Result Value Ref Range   Sodium 129 (*) 137 - 147 mEq/L   Potassium 3.5 (*) 3.7 - 5.3 mEq/L   Chloride 92 (*) 96 - 112 mEq/L   CO2 25  19 - 32 mEq/L   Glucose, Bld 89  70 - 99 mg/dL   BUN 7  6 - 23 mg/dL   Creatinine, Ser 0.66  0.50 - 1.10 mg/dL   Calcium 8.9  8.4 - 10.5 mg/dL   GFR calc non Af Amer 81 (*) >90 mL/min   GFR calc Af Amer >90  >90 mL/min   Comment: (NOTE)     The eGFR has been calculated using the CKD EPI equation.     This calculation has not been validated in all clinical situations.      eGFR's persistently <90 mL/min signify possible Chronic Kidney     Disease.   Anion gap 12  5 - 15    ABGS No results found for this basename: PHART, PCO2, PO2ART, TCO2, HCO3,  in the last 72 hours CULTURES No results found for this or any previous visit (from the past 240 hour(s)). Studies/Results: Ct Head Wo Contrast  03/22/2014   CLINICAL DATA:  80 year old who fell, striking the back of the head earlier today. Acute mental status changes.  EXAM: CT HEAD WITHOUT CONTRAST  TECHNIQUE: Contiguous axial images were obtained from the base of the skull through the vertex without intravenous contrast.  COMPARISON:  06/07/2004.  FINDINGS: Moderate cortical and deep atrophy and mild cerebellar atrophy, only minimally progressive since the prior examination. Mild changes of small vessel disease of the white matter, unchanged. Ventricular system normal in size and appearance for age. No mass lesion. No midline shift. No acute hemorrhage or hematoma. No extra-axial fluid collections. No evidence of acute infarction.  No skull fracture or other focal osseous abnormality involving the skull. Visualized paranasal sinuses, bilateral mastoid air cells and bilateral middle ear cavities well-aerated. Bilateral carotid siphon and left vertebral artery atherosclerosis.  IMPRESSION: 1. No acute intracranial abnormality. 2. Stable moderate generalized atrophy and mild chronic microvascular ischemic changes of the white matter.   Electronically Signed   By: Evangeline Dakin M.D.   On: 03/22/2014 16:11   Ct Abdomen Pelvis W Contrast  03/22/2014   CLINICAL DATA:  Upper abdominal pain with nausea and lack of bowel movements for 10 days. Hypertension.  EXAM: CT ABDOMEN AND PELVIS WITH CONTRAST  TECHNIQUE: Multidetector CT imaging of the abdomen and pelvis was performed using the standard protocol following bolus administration of intravenous contrast.  CONTRAST:  124mL OMNIPAQUE IOHEXOL 300 MG/ML SOLN, 74mL OMNIPAQUE IOHEXOL  300 MG/ML SOLN  COMPARISON:  None.  FINDINGS: Lower chest: Mild motion degradation. Mild centrilobular emphysema. Mild cardiomegaly.  Liver:  Well-circumscribed liver lesions which are likely cysts.  Spleen: Normal  Stomach: Normal, without wall thickening.  Pancreas: Normal, without mass or pancreatic ductal dilatation.  Gallbladder/Biliary Tree: Normal gallbladder, without intra or  extrahepatic biliary ductal dilatation.  Kidneys/Adrenals: Normal adrenal glands. Bilateral renal cysts. The largest is off the lower pole left kidney and measures 6.9 cm.  Bowel loops: Scattered colonic diverticula. Normal terminal ileum and appendix. Normal small bowel without abdominal ascites.  Vascular: Dense aortic and branch vessel atherosclerosis  Nodes: No retroperitoneal or retrocrural adenopathy. No pelvic adenopathy.  Pelvic Genitourinary: Normal urinary bladder and uterus. No adnexal mass.  Other: No significant free fluid.  Bones/Musculoskeletal: Moderate osteopenia. Convex right lumbar spine curvature.  IMPRESSION: No acute process in the abdomen or pelvis.   Electronically Signed   By: Abigail Miyamoto M.D.   On: 03/22/2014 16:15   Dg Chest Port 1 View  03/22/2014   CLINICAL DATA:  Syncope.  Weakness.  EXAM: PORTABLE CHEST - 1 VIEW  COMPARISON:  CT chest 06/07/2004.  FINDINGS: Cardiac silhouette upper normal in size 2 borderline enlarged for AP portable technique, unchanged. Thoracic aorta tortuous and atherosclerotic, unchanged. Hilar and mediastinal contours otherwise unremarkable. Emphysematous changes in the upper lobes. Prominent bronchovascular markings diffusely and mild to moderate central peribronchial thickening, unchanged. Lungs otherwise clear. No localized airspace consolidation. No pleural effusions. No pneumothorax. Normal pulmonary vascularity.  IMPRESSION: Stable borderline heart size. Stable COPD (chronic bronchitis) and emphysema. No acute cardiopulmonary disease.   Electronically Signed   By: Evangeline Dakin M.D.   On: 03/22/2014 15:29    Medications: I have reviewed the patient's current medications.  Assesment: Active Problems:   Hyponatremia syncopal episode  Plan: Medications reviewed Continue normal saline Will monitor BMP Will do carotid doppler and echo    LOS: 1 day   Makana Feigel 03/23/2014, 9:40 AM

## 2014-03-24 LAB — BASIC METABOLIC PANEL
Anion gap: 10 (ref 5–15)
BUN: 3 mg/dL — ABNORMAL LOW (ref 6–23)
CHLORIDE: 99 meq/L (ref 96–112)
CO2: 25 mEq/L (ref 19–32)
Calcium: 8.9 mg/dL (ref 8.4–10.5)
Creatinine, Ser: 0.6 mg/dL (ref 0.50–1.10)
GFR calc Af Amer: >90 mL/min (ref >90)
GFR calc non Af Amer: 84 mL/min — ABNORMAL LOW (ref >90)
GLUCOSE: 90 mg/dL (ref 70–99)
POTASSIUM: 4 meq/L (ref 3.7–5.3)
SODIUM: 134 meq/L — AB (ref 137–147)

## 2014-03-24 MED ORDER — POLYETHYLENE GLYCOL 3350 17 G PO PACK: 17.0000 g | PACK | Freq: Two times a day (BID) | ORAL | Status: DC

## 2014-03-24 NOTE — Discharge Summary (Signed)
Physician Discharge Summary  KATE LAROCK IRW:[id number] DOB: [birth year] DOA: 03/22/2014   Admit date: 03/22/2014 Discharge date: 03/24/2014  Discharge Diagnoses: Syncope #2. Hyponatremia #3. Hypokalemia #4. Hypertension. #5. Constipation #6. Anxiety Active Problems:   Hyponatremia    Wt Readings from Last 3 Encounters:  03/22/14 132 lb (59.875 kg)  05/30/12 131 lb (59.421 kg)  03/26/12 125 lb (56.7 kg)     Hospital Course:  This patient is an 80 year old female who presented after experiencing a syncopal episode at home. Her head CT revealed no acute abnormality. She was hospitalized and had no sign of dysrhythmia on telemetry. A carotid ultrasound revealed only mild plaque. An echo was obtained but is pending. She was hyponatremic at 120. She was treated with water restriction and normal saline. Her serum sodium gradually improved to 134. She was hypokalemic and improved to 4.0 with supplementation. Chlorthalidone have been part of her antihypertensive regimen. This is being discontinued. She will continue altace.  She was also evaluated for abdominal pain with a CT scan which revealed no acute abnormality. She was treated for constipation with MiraLAX which has been effective.  She is improved and stable for discharge on the morning of the 20th. She will be seen in followup in my office in one week. Free water restriction was discussed.   Discharge Instructions     Medication List    STOP taking these medications       chlorthalidone 25 MG tablet  Commonly known as:  HYGROTON      TAKE these medications       ALTACE PO  Take 1 capsule by mouth daily.     clonazePAM 0.5 MG tablet  Commonly known as:  KLONOPIN  Take 0.25 mg by mouth 2 (two) times daily as needed for anxiety.     polyethylene glycol packet  Commonly known as:  MIRALAX / GLYCOLAX  Take 17 g by mouth 2 (two) times daily.     ZOCOR PO  Take 1 tablet by mouth at bedtime.     zolpidem 5 MG tablet   Commonly known as:  AMBIEN  Take 5 mg by mouth at bedtime.         Montreal Steidle 03/24/2014

## 2014-03-31 DIAGNOSIS — E871 Hypo-osmolality and hyponatremia: Secondary | ICD-10-CM | POA: Diagnosis not present

## 2014-03-31 DIAGNOSIS — Z79899 Other long term (current) drug therapy: Secondary | ICD-10-CM | POA: Diagnosis not present

## 2014-03-31 DIAGNOSIS — I6789 Other cerebrovascular disease: Secondary | ICD-10-CM | POA: Diagnosis not present

## 2014-03-31 DIAGNOSIS — R55 Syncope and collapse: Secondary | ICD-10-CM | POA: Diagnosis not present

## 2014-03-31 DIAGNOSIS — I10 Essential (primary) hypertension: Secondary | ICD-10-CM | POA: Diagnosis not present

## 2014-03-31 DIAGNOSIS — E785 Hyperlipidemia, unspecified: Secondary | ICD-10-CM | POA: Diagnosis not present

## 2014-04-07 NOTE — Progress Notes (Signed)
UR review complete.  

## 2014-04-28 DIAGNOSIS — Z79899 Other long term (current) drug therapy: Secondary | ICD-10-CM | POA: Diagnosis not present

## 2014-05-06 DIAGNOSIS — F411 Generalized anxiety disorder: Secondary | ICD-10-CM | POA: Diagnosis not present

## 2014-05-06 DIAGNOSIS — I10 Essential (primary) hypertension: Secondary | ICD-10-CM | POA: Diagnosis not present

## 2014-05-06 DIAGNOSIS — E871 Hypo-osmolality and hyponatremia: Secondary | ICD-10-CM | POA: Diagnosis not present

## 2014-07-01 DIAGNOSIS — Z23 Encounter for immunization: Secondary | ICD-10-CM | POA: Diagnosis not present

## 2014-08-14 DIAGNOSIS — I10 Essential (primary) hypertension: Secondary | ICD-10-CM | POA: Diagnosis not present

## 2014-08-14 DIAGNOSIS — F419 Anxiety disorder, unspecified: Secondary | ICD-10-CM | POA: Diagnosis not present

## 2015-01-20 DIAGNOSIS — F419 Anxiety disorder, unspecified: Secondary | ICD-10-CM | POA: Diagnosis not present

## 2015-01-20 DIAGNOSIS — I10 Essential (primary) hypertension: Secondary | ICD-10-CM | POA: Diagnosis not present

## 2015-03-18 DIAGNOSIS — E785 Hyperlipidemia, unspecified: Secondary | ICD-10-CM | POA: Diagnosis not present

## 2015-03-18 DIAGNOSIS — I10 Essential (primary) hypertension: Secondary | ICD-10-CM | POA: Diagnosis not present

## 2015-03-18 DIAGNOSIS — I6781 Acute cerebrovascular insufficiency: Secondary | ICD-10-CM | POA: Diagnosis not present

## 2015-03-18 DIAGNOSIS — Z79899 Other long term (current) drug therapy: Secondary | ICD-10-CM | POA: Diagnosis not present

## 2015-03-27 DIAGNOSIS — Z23 Encounter for immunization: Secondary | ICD-10-CM | POA: Diagnosis not present

## 2015-03-27 DIAGNOSIS — Z6824 Body mass index (BMI) 24.0-24.9, adult: Secondary | ICD-10-CM | POA: Diagnosis not present

## 2015-03-27 DIAGNOSIS — E785 Hyperlipidemia, unspecified: Secondary | ICD-10-CM | POA: Diagnosis not present

## 2015-03-27 DIAGNOSIS — I10 Essential (primary) hypertension: Secondary | ICD-10-CM | POA: Diagnosis not present

## 2015-08-07 DIAGNOSIS — G4700 Insomnia, unspecified: Secondary | ICD-10-CM | POA: Diagnosis not present

## 2015-08-07 DIAGNOSIS — Z23 Encounter for immunization: Secondary | ICD-10-CM | POA: Diagnosis not present

## 2015-08-07 DIAGNOSIS — I10 Essential (primary) hypertension: Secondary | ICD-10-CM | POA: Diagnosis not present

## 2015-08-07 DIAGNOSIS — Z6824 Body mass index (BMI) 24.0-24.9, adult: Secondary | ICD-10-CM | POA: Diagnosis not present

## 2015-09-18 DIAGNOSIS — Z6824 Body mass index (BMI) 24.0-24.9, adult: Secondary | ICD-10-CM | POA: Diagnosis not present

## 2015-09-18 DIAGNOSIS — R609 Edema, unspecified: Secondary | ICD-10-CM | POA: Diagnosis not present

## 2015-12-10 DIAGNOSIS — I639 Cerebral infarction, unspecified: Secondary | ICD-10-CM | POA: Diagnosis not present

## 2015-12-10 DIAGNOSIS — F419 Anxiety disorder, unspecified: Secondary | ICD-10-CM | POA: Diagnosis not present

## 2016-04-14 DIAGNOSIS — E785 Hyperlipidemia, unspecified: Secondary | ICD-10-CM | POA: Diagnosis not present

## 2016-04-14 DIAGNOSIS — N3000 Acute cystitis without hematuria: Secondary | ICD-10-CM | POA: Diagnosis not present

## 2016-04-14 DIAGNOSIS — I10 Essential (primary) hypertension: Secondary | ICD-10-CM | POA: Diagnosis not present

## 2016-04-14 DIAGNOSIS — I6781 Acute cerebrovascular insufficiency: Secondary | ICD-10-CM | POA: Diagnosis not present

## 2016-04-14 DIAGNOSIS — Z79899 Other long term (current) drug therapy: Secondary | ICD-10-CM | POA: Diagnosis not present

## 2016-04-14 DIAGNOSIS — F419 Anxiety disorder, unspecified: Secondary | ICD-10-CM | POA: Diagnosis not present

## 2016-04-21 DIAGNOSIS — F419 Anxiety disorder, unspecified: Secondary | ICD-10-CM | POA: Diagnosis not present

## 2016-04-21 DIAGNOSIS — I10 Essential (primary) hypertension: Secondary | ICD-10-CM | POA: Diagnosis not present

## 2016-06-13 DIAGNOSIS — Z23 Encounter for immunization: Secondary | ICD-10-CM | POA: Diagnosis not present

## 2016-07-24 ENCOUNTER — Emergency Department (HOSPITAL_COMMUNITY): Payer: Medicare Other

## 2016-07-24 ENCOUNTER — Encounter (HOSPITAL_COMMUNITY): Payer: Self-pay | Admitting: Emergency Medicine

## 2016-07-24 ENCOUNTER — Emergency Department (HOSPITAL_COMMUNITY)
Admission: EM | Admit: 2016-07-24 | Discharge: 2016-07-24 | Disposition: A | Discharge status: Home / Self Care | Payer: Medicare Other | Attending: Emergency Medicine | Admitting: Emergency Medicine

## 2016-07-24 DIAGNOSIS — J40 Bronchitis, not specified as acute or chronic: Secondary | ICD-10-CM | POA: Insufficient documentation

## 2016-07-24 DIAGNOSIS — R05 Cough: Secondary | ICD-10-CM | POA: Diagnosis not present

## 2016-07-24 DIAGNOSIS — J029 Acute pharyngitis, unspecified: Secondary | ICD-10-CM | POA: Diagnosis not present

## 2016-07-24 DIAGNOSIS — Z79899 Other long term (current) drug therapy: Secondary | ICD-10-CM | POA: Diagnosis not present

## 2016-07-24 DIAGNOSIS — I10 Essential (primary) hypertension: Secondary | ICD-10-CM | POA: Insufficient documentation

## 2016-07-24 HISTORY — DX: Cerebral infarction, unspecified: I63.9

## 2016-07-24 LAB — CBC WITH DIFFERENTIAL/PLATELET
Basophils Absolute: 0 10*3/uL (ref 0.0–0.1)
Basophils Relative: 0 %
EOS ABS: 0.1 10*3/uL (ref 0.0–0.7)
EOS PCT: 1 %
HCT: 41.6 % (ref 36.0–46.0)
Hemoglobin: 13.6 g/dL (ref 12.0–15.0)
LYMPHS ABS: 0.6 10*3/uL — AB (ref 0.7–4.0)
LYMPHS PCT: 7 %
MCH: 28.9 pg (ref 26.0–34.0)
MCHC: 32.7 g/dL (ref 30.0–36.0)
MCV: 88.3 fL (ref 78.0–100.0)
MONO ABS: 0.8 10*3/uL (ref 0.1–1.0)
Monocytes Relative: 10 %
Neutro Abs: 6.7 10*3/uL (ref 1.7–7.7)
Neutrophils Relative %: 82 %
PLATELETS: 177 10*3/uL (ref 150–400)
RBC: 4.71 MIL/uL (ref 3.87–5.11)
RDW: 13.8 % (ref 11.5–15.5)
WBC: 8.2 10*3/uL (ref 4.0–10.5)

## 2016-07-24 LAB — BASIC METABOLIC PANEL
Anion gap: 8 (ref 5–15)
BUN: 12 mg/dL (ref 6–20)
CALCIUM: 9.1 mg/dL (ref 8.9–10.3)
CO2: 27 mmol/L (ref 22–32)
CREATININE: 0.9 mg/dL (ref 0.44–1.00)
Chloride: 100 mmol/L — ABNORMAL LOW (ref 101–111)
GFR calc Af Amer: >60 mL/min (ref >60)
GFR, EST NON AFRICAN AMERICAN: 58 mL/min — AB (ref >60)
Glucose, Bld: 112 mg/dL — ABNORMAL HIGH (ref 65–99)
Potassium: 3.7 mmol/L (ref 3.5–5.1)
SODIUM: 135 mmol/L (ref 135–145)

## 2016-07-24 LAB — LACTIC ACID, PLASMA: LACTIC ACID, VENOUS: 0.9 mmol/L (ref 0.5–1.9)

## 2016-07-24 MED ORDER — AMOXICILLIN 250 MG PO CAPS
500.0000 mg | ORAL_CAPSULE | Freq: Once | ORAL | Status: AC
  Administered 2016-07-24: 500 mg via ORAL
  Filled 2016-07-24: qty 2

## 2016-07-24 MED ORDER — AMOXICILLIN 500 MG PO CAPS: 500.0000 mg | ORAL_CAPSULE | Freq: Three times a day (TID) | ORAL | 0 refills | Status: DC

## 2016-07-24 MED ORDER — ALBUTEROL SULFATE HFA 108 (90 BASE) MCG/ACT IN AERS
2.0000 | INHALATION_SPRAY | Freq: Once | RESPIRATORY_TRACT | Status: AC
  Administered 2016-07-24: 2 via RESPIRATORY_TRACT
  Filled 2016-07-24: qty 6.7

## 2016-07-24 NOTE — ED Triage Notes (Signed)
Pt reports cold symptoms, sore throat and cough that is non productive. Pt states the congestion is deep in her chest. Pt voice is raspy.

## 2016-07-26 NOTE — ED Provider Notes (Signed)
[facility] DEPT Provider Note   CSN: [id number] Arrival date & time: 07/24/16  1341     History   Chief Complaint Chief Complaint  Patient presents with  . Cough    HPI Amy Sawyer is a 82 y.o. female.  HPI Patient reports sore throat and mild cough over the past several days.  She feels that this began as upper respiratory tract infection now has "settled" in her chest.  She denies fevers or chills.  She reports painful swallowing.  No neck pain is neck stiffness.  No altered mental status per family.  No other complaints.  Symptoms are mild in severity.  Denies abdominal pain.   Past Medical History:  Diagnosis Date  . HTN (hypertension)   . Hyperlipemia   . Stroke [facility])     Patient Active Problem List   Diagnosis Date Noted  . Hyponatremia 03/22/2014  . Pain in joint, shoulder region 04/09/2012  . Muscle weakness (generalized) 04/09/2012  . Proximal humerus fracture 03/13/2012    Past Surgical History:  Procedure Laterality Date  . BREAST SURGERY    . LEG SURGERY      OB History    Gravida Para Term Preterm AB Living   '2 2 2         '$ SAB TAB Ectopic Multiple Live Births                   Home Medications    Prior to Admission medications   Medication Sig Start Date End Date Taking? Authorizing Provider  clonazePAM (KLONOPIN) 0.5 MG tablet Take 0.25 mg by mouth 2 (two) times daily as needed for anxiety.   Yes Historical Provider, MD  GuaiFENesin (MUCINEX PO) Take 1 tablet by mouth 2 (two) times daily.   Yes Historical Provider, MD  Oxymetazoline HCl (NASAL SPRAY) 0.05 % SOLN Place 1 spray into the nose 2 (two) times daily.   Yes Historical Provider, MD  ramipril (ALTACE) 10 MG capsule Take 10 mg by mouth daily.   Yes Historical Provider, MD  simvastatin (ZOCOR) 20 MG tablet Take 20 mg by mouth daily.   Yes Historical Provider, MD  zolpidem (AMBIEN) 5 MG tablet Take 5 mg by mouth at bedtime. 02/19/14  Yes Historical Provider, MD  amoxicillin  (AMOXIL) 500 MG capsule Take 1 capsule (500 mg total) by mouth 3 (three) times daily. 07/24/16   Jola Schmidt, MD    Family History Family History  Problem Relation Age of Onset  . Heart disease    . Diabetes    . Stroke Mother     Social History Social History  Substance Use Topics  . Smoking status: Never Smoker  . Smokeless tobacco: Never Used  . Alcohol use No     Allergies   Demerol [meperidine] and Hydrocodone-acetaminophen   Review of Systems Review of Systems  All other systems reviewed and are negative.    Physical Exam Updated Vital Signs BP 132/68   Pulse 72   Temp 98 F (36.7 C) (Oral)   Resp 16   Ht '5\' 4"'$  (1.626 m)   Wt 137 lb (62.1 kg)   SpO2 94%   BMI 23.52 kg/m   Physical Exam  Constitutional: She is oriented to person, place, and time. She appears well-developed and well-nourished. No distress.  HENT:  Head: Normocephalic and atraumatic.  Posterior pharyngeal erythema.  No tonsillar exudate.  Uvula midline.  Tolerating secretions.  Oral airway patent.  Dentition without acute abnormality  Eyes:  EOM are normal.  Neck: Normal range of motion.  Cardiovascular: Normal rate, regular rhythm and normal heart sounds.   Pulmonary/Chest: Effort normal and breath sounds normal.  Abdominal: Soft. She exhibits no distension. There is no tenderness.  Musculoskeletal: Normal range of motion.  Neurological: She is alert and oriented to person, place, and time.  Skin: Skin is warm and dry.  Psychiatric: She has a normal mood and affect. Judgment normal.  Nursing note and vitals reviewed.    ED Treatments / Results  Labs (all labs ordered are listed, but only abnormal results are displayed) Labs Reviewed  CBC WITH DIFFERENTIAL/PLATELET - Abnormal; Notable for the following:       Result Value   Lymphs Abs 0.6 (*)    All other components within normal limits  BASIC METABOLIC PANEL - Abnormal; Notable for the following:    Chloride 100 (*)     Glucose, Bld 112 (*)    GFR calc non Af Amer 58 (*)    All other components within normal limits  LACTIC ACID, PLASMA    EKG  EKG Interpretation None       Radiology Dg Chest 2 View  Result Date: 07/24/2016 CLINICAL DATA:  Cough and sore throat EXAM: CHEST  2 VIEW COMPARISON:  March 22, 2014 FINDINGS: There is mild interstitial thickening diffusely, stable. There is no edema or consolidation. Heart is mildly prominent with pulmonary vascularity within normal limits. No adenopathy. There is atherosclerotic calcification in the aorta. There is marked lower thoracic levoscoliosis. There is degenerative change in the thoracic spine. Bones are osteoporotic. IMPRESSION: Stable interstitial thickening without edema or consolidation. Stable cardiac silhouette. There is aortic atherosclerosis. Bones osteoporotic. There is scoliosis. Electronically Signed   By: Lowella Grip III M.D.   On: 07/24/2016 14:44    Procedures Procedures (including critical care time)  Medications Ordered in ED Medications  albuterol (PROVENTIL HFA;VENTOLIN HFA) 108 (90 Base) MCG/ACT inhaler 2 puff (2 puffs Inhalation Given 07/24/16 1615)  amoxicillin (AMOXIL) capsule 500 mg (500 mg Oral Given 07/24/16 1620)     Initial Impression / Assessment and Plan / ED Course  I have reviewed the triage vital signs and the nursing notes.  Pertinent labs & imaging results that were available during my care of the patient were reviewed by me and considered in my medical decision making (see chart for details).  Clinical Course     Overall the patient is well-appearing.  She is nontoxic.  She'll be started on amoxicillin for pharyngitis.  Doubt deep space infection.  Tolerating secretions.  She understands to return to the ER for new or worsening symptoms.  Final Clinical Impressions(s) / ED Diagnoses   Final diagnoses:  Bronchitis  Pharyngitis, unspecified etiology    New Prescriptions Discharge Medication List  as of 07/24/2016  5:16 PM    START taking these medications   Details  amoxicillin (AMOXIL) 500 MG capsule Take 1 capsule (500 mg total) by mouth 3 (three) times daily., Starting Sun 07/24/2016, Print         Jola Schmidt, MD 07/26/16 [phone]

## 2016-08-22 DIAGNOSIS — F419 Anxiety disorder, unspecified: Secondary | ICD-10-CM | POA: Diagnosis not present

## 2016-08-22 DIAGNOSIS — I700 Atherosclerosis of aorta: Secondary | ICD-10-CM | POA: Diagnosis not present

## 2016-08-22 DIAGNOSIS — N390 Urinary tract infection, site not specified: Secondary | ICD-10-CM | POA: Diagnosis not present

## 2016-08-22 DIAGNOSIS — I10 Essential (primary) hypertension: Secondary | ICD-10-CM | POA: Diagnosis not present

## 2016-12-19 DIAGNOSIS — I700 Atherosclerosis of aorta: Secondary | ICD-10-CM | POA: Diagnosis not present

## 2016-12-19 DIAGNOSIS — I491 Atrial premature depolarization: Secondary | ICD-10-CM | POA: Diagnosis not present

## 2016-12-19 DIAGNOSIS — F419 Anxiety disorder, unspecified: Secondary | ICD-10-CM | POA: Diagnosis not present

## 2017-05-09 DIAGNOSIS — I10 Essential (primary) hypertension: Secondary | ICD-10-CM | POA: Diagnosis not present

## 2017-05-09 DIAGNOSIS — F419 Anxiety disorder, unspecified: Secondary | ICD-10-CM | POA: Diagnosis not present

## 2017-05-09 DIAGNOSIS — Z79899 Other long term (current) drug therapy: Secondary | ICD-10-CM | POA: Diagnosis not present

## 2017-05-09 DIAGNOSIS — E785 Hyperlipidemia, unspecified: Secondary | ICD-10-CM | POA: Diagnosis not present

## 2017-05-15 DIAGNOSIS — E785 Hyperlipidemia, unspecified: Secondary | ICD-10-CM | POA: Diagnosis not present

## 2017-05-15 DIAGNOSIS — F419 Anxiety disorder, unspecified: Secondary | ICD-10-CM | POA: Diagnosis not present

## 2017-05-15 DIAGNOSIS — I10 Essential (primary) hypertension: Secondary | ICD-10-CM | POA: Diagnosis not present

## 2017-05-22 DIAGNOSIS — Z23 Encounter for immunization: Secondary | ICD-10-CM | POA: Diagnosis not present

## 2017-06-05 DIAGNOSIS — H2513 Age-related nuclear cataract, bilateral: Secondary | ICD-10-CM | POA: Diagnosis not present

## 2017-06-06 DIAGNOSIS — Z0120 Encounter for dental examination and cleaning without abnormal findings: Secondary | ICD-10-CM | POA: Insufficient documentation

## 2017-06-21 DIAGNOSIS — N390 Urinary tract infection, site not specified: Secondary | ICD-10-CM | POA: Diagnosis not present

## 2017-07-25 DIAGNOSIS — H2511 Age-related nuclear cataract, right eye: Secondary | ICD-10-CM | POA: Diagnosis not present

## 2017-09-11 DIAGNOSIS — I10 Essential (primary) hypertension: Secondary | ICD-10-CM | POA: Diagnosis not present

## 2017-09-11 DIAGNOSIS — Z6827 Body mass index (BMI) 27.0-27.9, adult: Secondary | ICD-10-CM | POA: Diagnosis not present

## 2017-09-11 DIAGNOSIS — I700 Atherosclerosis of aorta: Secondary | ICD-10-CM | POA: Diagnosis not present

## 2017-09-11 DIAGNOSIS — F411 Generalized anxiety disorder: Secondary | ICD-10-CM | POA: Diagnosis not present

## 2017-10-09 DIAGNOSIS — H2512 Age-related nuclear cataract, left eye: Secondary | ICD-10-CM | POA: Diagnosis not present

## 2017-10-09 DIAGNOSIS — H2511 Age-related nuclear cataract, right eye: Secondary | ICD-10-CM | POA: Diagnosis not present

## 2017-10-09 DIAGNOSIS — H52201 Unspecified astigmatism, right eye: Secondary | ICD-10-CM | POA: Diagnosis not present

## 2017-10-09 DIAGNOSIS — H524 Presbyopia: Secondary | ICD-10-CM | POA: Diagnosis not present

## 2017-10-09 DIAGNOSIS — H5211 Myopia, right eye: Secondary | ICD-10-CM | POA: Diagnosis not present

## 2017-10-18 DIAGNOSIS — H2511 Age-related nuclear cataract, right eye: Secondary | ICD-10-CM | POA: Diagnosis not present

## 2017-10-18 DIAGNOSIS — H2512 Age-related nuclear cataract, left eye: Secondary | ICD-10-CM | POA: Diagnosis not present

## 2017-10-31 ENCOUNTER — Ambulatory Visit (HOSPITAL_COMMUNITY)
Admission: RE | Admit: 2017-10-31 | Discharge: 2017-10-31 | Disposition: A | Discharge status: Home / Self Care | Payer: Medicare Other | Source: Ambulatory Visit | Attending: Internal Medicine | Admitting: Internal Medicine

## 2017-10-31 ENCOUNTER — Other Ambulatory Visit (HOSPITAL_COMMUNITY): Payer: Self-pay | Admitting: Internal Medicine

## 2017-10-31 DIAGNOSIS — R059 Cough, unspecified: Secondary | ICD-10-CM

## 2017-10-31 DIAGNOSIS — I700 Atherosclerosis of aorta: Secondary | ICD-10-CM | POA: Diagnosis not present

## 2017-10-31 DIAGNOSIS — R05 Cough: Secondary | ICD-10-CM | POA: Diagnosis not present

## 2017-11-06 DIAGNOSIS — I700 Atherosclerosis of aorta: Secondary | ICD-10-CM | POA: Diagnosis not present

## 2017-11-06 DIAGNOSIS — J208 Acute bronchitis due to other specified organisms: Secondary | ICD-10-CM | POA: Diagnosis not present

## 2017-11-06 DIAGNOSIS — Z6826 Body mass index (BMI) 26.0-26.9, adult: Secondary | ICD-10-CM | POA: Diagnosis not present

## 2017-11-29 DIAGNOSIS — H2512 Age-related nuclear cataract, left eye: Secondary | ICD-10-CM | POA: Diagnosis not present

## 2018-01-15 DIAGNOSIS — F419 Anxiety disorder, unspecified: Secondary | ICD-10-CM | POA: Diagnosis not present

## 2018-01-15 DIAGNOSIS — R030 Elevated blood-pressure reading, without diagnosis of hypertension: Secondary | ICD-10-CM | POA: Diagnosis not present

## 2018-01-15 DIAGNOSIS — I8390 Asymptomatic varicose veins of unspecified lower extremity: Secondary | ICD-10-CM | POA: Diagnosis not present

## 2018-01-22 DIAGNOSIS — N3000 Acute cystitis without hematuria: Secondary | ICD-10-CM | POA: Diagnosis not present

## 2018-02-18 DIAGNOSIS — W19XXXA Unspecified fall, initial encounter: Secondary | ICD-10-CM | POA: Diagnosis not present

## 2018-02-18 DIAGNOSIS — R402441 Other coma, without documented Glasgow coma scale score, or with partial score reported, in the field [EMT or ambulance]: Secondary | ICD-10-CM | POA: Diagnosis not present

## 2018-02-18 DIAGNOSIS — R0902 Hypoxemia: Secondary | ICD-10-CM | POA: Diagnosis not present

## 2018-02-18 DIAGNOSIS — R0689 Other abnormalities of breathing: Secondary | ICD-10-CM | POA: Diagnosis not present

## 2018-02-18 DIAGNOSIS — R4182 Altered mental status, unspecified: Secondary | ICD-10-CM | POA: Diagnosis not present

## 2018-02-19 ENCOUNTER — Emergency Department (HOSPITAL_COMMUNITY): Payer: Medicare Other

## 2018-02-19 ENCOUNTER — Observation Stay (HOSPITAL_COMMUNITY): Payer: Medicare Other

## 2018-02-19 ENCOUNTER — Inpatient Hospital Stay (HOSPITAL_COMMUNITY)
Admission: EM | Admit: 2018-02-19 | Discharge: 2018-02-20 | DRG: 064 | Disposition: A | Discharge status: Home Health | Payer: Medicare Other | Attending: Family Medicine | Admitting: Family Medicine

## 2018-02-19 ENCOUNTER — Encounter (HOSPITAL_COMMUNITY): Payer: Self-pay | Admitting: *Deleted

## 2018-02-19 ENCOUNTER — Other Ambulatory Visit: Payer: Self-pay

## 2018-02-19 DIAGNOSIS — W19XXXA Unspecified fall, initial encounter: Secondary | ICD-10-CM | POA: Diagnosis present

## 2018-02-19 DIAGNOSIS — I739 Peripheral vascular disease, unspecified: Secondary | ICD-10-CM | POA: Diagnosis present

## 2018-02-19 DIAGNOSIS — R29700 NIHSS score 0: Secondary | ICD-10-CM | POA: Diagnosis present

## 2018-02-19 DIAGNOSIS — R4182 Altered mental status, unspecified: Secondary | ICD-10-CM

## 2018-02-19 DIAGNOSIS — S41112A Laceration without foreign body of left upper arm, initial encounter: Secondary | ICD-10-CM | POA: Diagnosis present

## 2018-02-19 DIAGNOSIS — I5030 Unspecified diastolic (congestive) heart failure: Secondary | ICD-10-CM

## 2018-02-19 DIAGNOSIS — I639 Cerebral infarction, unspecified: Secondary | ICD-10-CM | POA: Diagnosis not present

## 2018-02-19 DIAGNOSIS — I10 Essential (primary) hypertension: Secondary | ICD-10-CM | POA: Diagnosis not present

## 2018-02-19 DIAGNOSIS — F0390 Unspecified dementia without behavioral disturbance: Secondary | ICD-10-CM | POA: Diagnosis present

## 2018-02-19 DIAGNOSIS — R531 Weakness: Secondary | ICD-10-CM | POA: Diagnosis not present

## 2018-02-19 DIAGNOSIS — H9190 Unspecified hearing loss, unspecified ear: Secondary | ICD-10-CM | POA: Diagnosis present

## 2018-02-19 DIAGNOSIS — G459 Transient cerebral ischemic attack, unspecified: Secondary | ICD-10-CM | POA: Diagnosis not present

## 2018-02-19 DIAGNOSIS — S3991XA Unspecified injury of abdomen, initial encounter: Secondary | ICD-10-CM | POA: Diagnosis not present

## 2018-02-19 DIAGNOSIS — E785 Hyperlipidemia, unspecified: Secondary | ICD-10-CM | POA: Diagnosis present

## 2018-02-19 DIAGNOSIS — E46 Unspecified protein-calorie malnutrition: Secondary | ICD-10-CM

## 2018-02-19 DIAGNOSIS — Z823 Family history of stroke: Secondary | ICD-10-CM | POA: Diagnosis not present

## 2018-02-19 DIAGNOSIS — R52 Pain, unspecified: Secondary | ICD-10-CM | POA: Diagnosis not present

## 2018-02-19 DIAGNOSIS — R509 Fever, unspecified: Secondary | ICD-10-CM | POA: Diagnosis present

## 2018-02-19 DIAGNOSIS — Z6824 Body mass index (BMI) 24.0-24.9, adult: Secondary | ICD-10-CM | POA: Diagnosis not present

## 2018-02-19 DIAGNOSIS — E43 Unspecified severe protein-calorie malnutrition: Secondary | ICD-10-CM

## 2018-02-19 DIAGNOSIS — Z8673 Personal history of transient ischemic attack (TIA), and cerebral infarction without residual deficits: Secondary | ICD-10-CM | POA: Diagnosis not present

## 2018-02-19 DIAGNOSIS — R319 Hematuria, unspecified: Secondary | ICD-10-CM | POA: Diagnosis not present

## 2018-02-19 DIAGNOSIS — N289 Disorder of kidney and ureter, unspecified: Secondary | ICD-10-CM | POA: Diagnosis not present

## 2018-02-19 DIAGNOSIS — I700 Atherosclerosis of aorta: Secondary | ICD-10-CM | POA: Diagnosis not present

## 2018-02-19 DIAGNOSIS — R4020 Unspecified coma: Secondary | ICD-10-CM | POA: Diagnosis not present

## 2018-02-19 DIAGNOSIS — S59912A Unspecified injury of left forearm, initial encounter: Secondary | ICD-10-CM | POA: Diagnosis not present

## 2018-02-19 LAB — COMPREHENSIVE METABOLIC PANEL
ALT: 10 U/L — ABNORMAL LOW (ref 14–54)
ANION GAP: 9 (ref 5–15)
AST: 21 U/L (ref 15–41)
Albumin: 4 g/dL (ref 3.5–5.0)
Alkaline Phosphatase: 47 U/L (ref 38–126)
BILIRUBIN TOTAL: 0.7 mg/dL (ref 0.3–1.2)
BUN: 9 mg/dL (ref 6–20)
CO2: 26 mmol/L (ref 22–32)
Calcium: 9 mg/dL (ref 8.9–10.3)
Chloride: 102 mmol/L (ref 101–111)
Creatinine, Ser: 0.85 mg/dL (ref 0.44–1.00)
GFR calc Af Amer: >60 mL/min (ref >60)
Glucose, Bld: 101 mg/dL — ABNORMAL HIGH (ref 65–99)
POTASSIUM: 3.6 mmol/L (ref 3.5–5.1)
Sodium: 137 mmol/L (ref 135–145)
TOTAL PROTEIN: 6.8 g/dL (ref 6.5–8.1)

## 2018-02-19 LAB — LIPID PANEL
CHOLESTEROL: 119 mg/dL (ref 0–200)
HDL: 43 mg/dL (ref >40)
LDL Cholesterol: 67 mg/dL (ref 0–99)
TRIGLYCERIDES: 45 mg/dL (ref <150)
Total CHOL/HDL Ratio: 2.8 RATIO
VLDL: 9 mg/dL (ref 0–40)

## 2018-02-19 LAB — CBC WITH DIFFERENTIAL/PLATELET
BASOS ABS: 0 10*3/uL (ref 0.0–0.1)
Basophils Relative: 1 %
EOS PCT: 2 %
Eosinophils Absolute: 0.1 10*3/uL (ref 0.0–0.7)
HCT: 40.5 % (ref 36.0–46.0)
HEMOGLOBIN: 13.1 g/dL (ref 12.0–15.0)
LYMPHS ABS: 0.8 10*3/uL (ref 0.7–4.0)
LYMPHS PCT: 12 %
MCH: 29.2 pg (ref 26.0–34.0)
MCHC: 32.3 g/dL (ref 30.0–36.0)
MCV: 90.4 fL (ref 78.0–100.0)
Monocytes Absolute: 0.4 10*3/uL (ref 0.1–1.0)
Monocytes Relative: 5 %
NEUTROS ABS: 5.3 10*3/uL (ref 1.7–7.7)
NEUTROS PCT: 80 %
PLATELETS: 214 10*3/uL (ref 150–400)
RBC: 4.48 MIL/uL (ref 3.87–5.11)
RDW: 13.7 % (ref 11.5–15.5)
WBC: 6.5 10*3/uL (ref 4.0–10.5)

## 2018-02-19 LAB — URINALYSIS, ROUTINE W REFLEX MICROSCOPIC
BACTERIA UA: NONE SEEN
Bilirubin Urine: NEGATIVE
GLUCOSE, UA: NEGATIVE mg/dL
Ketones, ur: 5 mg/dL — AB
LEUKOCYTES UA: NEGATIVE
Nitrite: NEGATIVE
PROTEIN: NEGATIVE mg/dL
RBC / HPF: >50 RBC/hpf — ABNORMAL HIGH (ref 0–5)
Specific Gravity, Urine: 1.012 (ref 1.005–1.030)
pH: 8 (ref 5.0–8.0)

## 2018-02-19 LAB — HEMOGLOBIN A1C
Hgb A1c MFr Bld: 5.2 % (ref 4.8–5.6)
Mean Plasma Glucose: 102.54 mg/dL

## 2018-02-19 LAB — ECHOCARDIOGRAM COMPLETE
Height: 60 in
Weight: 1978.85 oz

## 2018-02-19 LAB — I-STAT CG4 LACTIC ACID, ED
LACTIC ACID, VENOUS: 1.48 mmol/L (ref 0.5–1.9)
Lactic Acid, Venous: 0.69 mmol/L (ref 0.5–1.9)

## 2018-02-19 MED ORDER — ACETAMINOPHEN 650 MG RE SUPP: 650.0000 mg | RECTAL | Status: DC | PRN

## 2018-02-19 MED ORDER — ACETAMINOPHEN 160 MG/5ML PO SOLN: 650.0000 mg | ORAL | Status: DC | PRN

## 2018-02-19 MED ORDER — ACETAMINOPHEN 325 MG PO TABS: 650.0000 mg | ORAL_TABLET | Freq: Four times a day (QID) | ORAL | Status: DC | PRN

## 2018-02-19 MED ORDER — ENOXAPARIN SODIUM 40 MG/0.4ML ~~LOC~~ SOLN
40.0000 mg | SUBCUTANEOUS | Status: DC
  Administered 2018-02-19 – 2018-02-20 (×2): 40 mg via SUBCUTANEOUS
  Filled 2018-02-19 (×2): qty 0.4

## 2018-02-19 MED ORDER — ENOXAPARIN SODIUM 40 MG/0.4ML ~~LOC~~ SOLN: 40.0000 mg | SUBCUTANEOUS | Status: DC

## 2018-02-19 MED ORDER — IOPAMIDOL (ISOVUE-300) INJECTION 61%
125.0000 mL | Freq: Once | INTRAVENOUS | Status: AC | PRN
  Administered 2018-02-19: 125 mL via INTRAVENOUS

## 2018-02-19 MED ORDER — ASPIRIN EC 81 MG PO TBEC
162.0000 mg | DELAYED_RELEASE_TABLET | Freq: Every day | ORAL | Status: DC
  Administered 2018-02-20: 162 mg via ORAL
  Filled 2018-02-19: qty 2

## 2018-02-19 MED ORDER — POTASSIUM CHLORIDE IN NACL 20-0.9 MEQ/L-% IV SOLN
INTRAVENOUS | Status: AC
  Administered 2018-02-19: 05:00:00 via INTRAVENOUS

## 2018-02-19 MED ORDER — BISACODYL 10 MG RE SUPP
10.0000 mg | Freq: Once | RECTAL | Status: AC
  Administered 2018-02-19: 10 mg via RECTAL
  Filled 2018-02-19: qty 1

## 2018-02-19 MED ORDER — ONDANSETRON HCL 4 MG/2ML IJ SOLN: 4.0000 mg | Freq: Four times a day (QID) | INTRAMUSCULAR | Status: DC | PRN

## 2018-02-19 MED ORDER — ACETAMINOPHEN 325 MG PO TABS: 650.0000 mg | ORAL_TABLET | ORAL | Status: DC | PRN

## 2018-02-19 MED ORDER — ACETAMINOPHEN 650 MG RE SUPP: 650.0000 mg | Freq: Four times a day (QID) | RECTAL | Status: DC | PRN

## 2018-02-19 MED ORDER — STROKE: EARLY STAGES OF RECOVERY BOOK
Freq: Once | Status: DC
  Filled 2018-02-19: qty 1

## 2018-02-19 MED ORDER — ASPIRIN 300 MG RE SUPP
300.0000 mg | Freq: Once | RECTAL | Status: AC
  Administered 2018-02-19: 300 mg via RECTAL
  Filled 2018-02-19: qty 1

## 2018-02-19 MED ORDER — ONDANSETRON HCL 4 MG PO TABS: 4.0000 mg | ORAL_TABLET | Freq: Four times a day (QID) | ORAL | Status: DC | PRN

## 2018-02-19 NOTE — ED Triage Notes (Signed)
Pt arrived to er by [facility] who picked pt up at home due to altered mental status, fever, per ems pt's daughter contacted ems because pt was not "acting normal" temp of 100.1 axillary, pt able to state her first name and keeps repeating " I am sick" Dr Dayna Barker at bedside,

## 2018-02-19 NOTE — ED Provider Notes (Signed)
Emergency Department Provider Note   I have reviewed the triage vital signs and the nursing notes.   HISTORY  Chief Complaint Altered Mental Status   HPI Amy Sawyer is a 84 y.o. female who is not able to give much history but daughter does state that she started speaking loudly around 430 this afternoon.  States that she was having difficulty saying the words that she wanted to say.  Started late dysarthria the way they described it.  No fevers, urinary, respiratory or GI symptoms they know of.  She did have what sounds like a mechanical fall yesterday causing a skin tear of her right arm and might have hit her head.  However she was normal after this.  Nobody witnessed it so unclear exactly what happened. No other associated or modifying symptoms.   LEVEL V CAVEAT SECONDARY TO ALTERED MENTAL STATUS  Past Medical History:  Diagnosis Date  . HTN (hypertension)   . Hyperlipemia   . Stroke [facility])     Patient Active Problem List   Diagnosis Date Noted  . Altered mental status 02/19/2018  . Hyperlipemia 02/19/2018  . Hypertension 02/19/2018  . Hyponatremia 03/22/2014  . Pain in joint, shoulder region 04/09/2012  . Muscle weakness (generalized) 04/09/2012  . Proximal humerus fracture 03/13/2012    Past Surgical History:  Procedure Laterality Date  . BREAST SURGERY    . LEG SURGERY      Current Outpatient Rx  . Order #: [id number] Class: Historical Med  . Order #: [id number] Class: Historical Med  . Order #: [id number] Class: Historical Med  . Order #: [id number] Class: Print  . Order #: [id number] Class: Historical Med  . Order #: [id number] Class: Historical Med  . Order #: [id number] Class: Historical Med    Allergies Demerol [meperidine] and Hydrocodone-acetaminophen  Family History  Problem Relation Age of Onset  . Heart disease Unknown   . Diabetes Unknown   . Stroke Mother     Social History Social History   Tobacco Use  . Smoking status: Never Smoker  .  Smokeless tobacco: Never Used  Substance Use Topics  . Alcohol use: No  . Drug use: No    Review of Systems  LEVEL V CAVEAT SECONDARY TO ALTERED MENTAL STATUS ____________________________________________   PHYSICAL EXAM:  VITAL SIGNS: Blood pressure (!) 157/97, pulse 82, temperature 99.1 F (37.3 C), temperature source Rectal, resp. rate (!) 26, SpO2 100 %.   Constitutional: Alert. Well appearing and in no acute distress. Eyes: Conjunctivae are normal. PERRL. EOMI. Right eye ptosis.  Head: Atraumatic. Nose: No congestion/rhinnorhea. Mouth/Throat: Mucous membranes are moist.  Oropharynx non-erythematous. Neck: No stridor.  No meningeal signs.   Cardiovascular: Normal rate, regular rhythm. Good peripheral circulation. Grossly normal heart sounds.   Respiratory: tachypneic respiratory effort.  No retractions. Lungs CTAB. Gastrointestinal: Soft and nontender. No distention.  Musculoskeletal: No lower extremity tenderness nor edema. No gross deformities of extremities. Neurologic:  Normal speech. Right eye ptosis.   Skin:  Skin is warm, dry and intact. No rash noted. Skin tear to left arm with underlying ttp.  ____________________________________________   LABS (all labs ordered are listed, but only abnormal results are displayed)  Labs Reviewed  COMPREHENSIVE METABOLIC PANEL - Abnormal; Notable for the following components:      Result Value   Glucose, Bld 101 (*)    ALT 10 (*)    All other components within normal limits  URINALYSIS, ROUTINE W REFLEX MICROSCOPIC - Abnormal; Notable for the following components:   APPearance  HAZY (*)    Hgb urine dipstick MODERATE (*)    Ketones, ur 5 (*)    RBC / HPF >50 (*)    All other components within normal limits  CULTURE, BLOOD (ROUTINE X 2)  CULTURE, BLOOD (ROUTINE X 2)  CBC WITH DIFFERENTIAL/PLATELET  HEMOGLOBIN A1C  LIPID PANEL  I-STAT CG4 LACTIC ACID, ED  I-STAT CG4 LACTIC ACID, ED    ____________________________________________  EKG   EKG Interpretation  Date/Time:  Monday February 19 2018 01:02:21 EDT Ventricular Rate:  82 PR Interval:    QRS Duration: 95 QT Interval:  361 QTC Calculation: 422 R Axis:   -18 Text Interpretation:  Sinus rhythm Borderline left axis deviation Borderline repolarization abnormality Baseline wander in lead(s) I III aVL No significant change since last tracing Confirmed by Merrily Pew [id number]) on 02/19/2018 1:05:31 AM       ____________________________________________  RADIOLOGY  Dg Chest 2 View  Result Date: 02/19/2018 CLINICAL DATA:  Altered mental status EXAM: CHEST - 2 VIEW COMPARISON:  10/31/2017 FINDINGS: Mildly low lung volumes. Cardiomegaly with aortic atherosclerosis. No focal airspace disease or pleural effusion. No pneumothorax. Lucency beneath the left diaphragm. IMPRESSION: 1. Cardiomegaly with low lung volumes but no acute pulmonary infiltrate 2. Lucency beneath the left diaphragm, questionable for air distended bowel; consider CT evaluation to exclude pneumoperitoneum. Electronically Signed   By: Donavan Foil M.D.   On: 02/19/2018 02:27   Dg Forearm Left  Result Date: 02/19/2018 CLINICAL DATA:  Trauma, altered mental status EXAM: LEFT FOREARM - 2 VIEW COMPARISON:  None. FINDINGS: No fracture or malalignment.  No significant elbow effusion IMPRESSION: No acute osseous abnormality Electronically Signed   By: Donavan Foil M.D.   On: 02/19/2018 02:28   Ct Head Wo Contrast  Result Date: 02/19/2018 CLINICAL DATA:  Altered LOC EXAM: CT HEAD WITHOUT CONTRAST TECHNIQUE: Contiguous axial images were obtained from the base of the skull through the vertex without intravenous contrast. COMPARISON:  CT brain 03/22/2014 FINDINGS: Brain: No hemorrhage or intracranial mass is visualized. Moderate-to-marked atrophy. Moderate small vessel ischemic changes of the white matter. Focal hypodensity within the left parietal lobe. Stable  ventricle size. Vascular: No hyperdense vessels. Vertebral and carotid vascular calcification. Skull: No fracture Sinuses/Orbits: No acute finding. Other: None IMPRESSION: 1. Focal cortical hypodensity at the left parietal lobe, suspicious for acute to subacute infarct. Further evaluation with MRI is suggested. No hemorrhage. 2. Atrophy with moderate small vessel ischemic changes of the white matter. Electronically Signed   By: Donavan Foil M.D.   On: 02/19/2018 02:34    ____________________________________________   PROCEDURES  Procedure(s) performed:   Procedures  CRITICAL CARE Performed by: Merrily Pew Total critical care time: 35 minutes Critical care time was exclusive of separately billable procedures and treating other patients. Critical care was necessary to treat or prevent imminent or life-threatening deterioration. Critical care was time spent personally by me on the following activities: development of treatment plan with patient and/or surrogate as well as nursing, discussions with consultants, evaluation of patient's response to treatment, examination of patient, obtaining history from patient or surrogate, ordering and performing treatments and interventions, ordering and review of laboratory studies, ordering and review of radiographic studies, pulse oximetry and re-evaluation of patient's condition.  ____________________________________________   INITIAL IMPRESSION / ASSESSMENT AND PLAN / ED COURSE  Sepsis (possibly pneumonia with hypoxia and tachypnea) with stroke reactivation vs cva. Even if acute cva, out of window for intervention and not VAN positive so not candidate for  tPA either way. Will ct head. Steri strip and xr left arm, otherwise likely admission.   Acute to subacute infarct, will need MRI. Once again not tPA candidate.  Also questioning free air in abdomen, abdominal exam benign, will repeat abdominal films.   Admit to medicine.   Pertinent labs &  imaging results that were available during my care of the patient were reviewed by me and considered in my medical decision making (see chart for details).  ____________________________________________  FINAL CLINICAL IMPRESSION(S) / ED DIAGNOSES  Final diagnoses:  TIA (transient ischemic attack)     MEDICATIONS GIVEN DURING THIS VISIT:  Medications   stroke: mapping our early stages of recovery book (has no administration in time range)  acetaminophen (TYLENOL) tablet 650 mg (has no administration in time range)    Or  acetaminophen (TYLENOL) solution 650 mg (has no administration in time range)    Or  acetaminophen (TYLENOL) suppository 650 mg (has no administration in time range)  enoxaparin (LOVENOX) injection 40 mg (has no administration in time range)  ondansetron (ZOFRAN) tablet 4 mg (has no administration in time range)    Or  ondansetron (ZOFRAN) injection 4 mg (has no administration in time range)  0.9 % NaCl with KCl 20 mEq/ L  infusion (has no administration in time range)  aspirin suppository 300 mg (has no administration in time range)     NEW OUTPATIENT MEDICATIONS STARTED DURING THIS VISIT:  New Prescriptions   No medications on file    Note:  This note was prepared with assistance of Dragon voice recognition software. Occasional wrong-word or sound-a-like substitutions may have occurred due to the inherent limitations of voice recognition software.   Cederick Broadnax, Corene Cornea, MD 02/19/18 [phone]

## 2018-02-19 NOTE — Plan of Care (Signed)
  Problem: Acute Rehab PT Goals(only PT should resolve) Goal: Pt Will Go Supine/Side To Sit Outcome: Progressing Flowsheets (Taken 02/19/2018 1359) Pt will go Supine/Side to Sit: with supervision Goal: Patient Will Transfer Sit To/From Stand Outcome: Progressing Flowsheets (Taken 02/19/2018 1359) Patient will transfer sit to/from stand: with min guard assist Goal: Pt Will Transfer Bed To Chair/Chair To Bed Outcome: Progressing Flowsheets (Taken 02/19/2018 1359) Pt will Transfer Bed to Chair/Chair to Bed: min guard assist Goal: Pt Will Ambulate Outcome: Progressing Flowsheets (Taken 02/19/2018 1359) Pt will Ambulate: 50 feet;with min guard assist;with rolling walker   1:59 PM, 02/19/18 Lonell Grandchild, MPT Physical Therapist with [facility] [phone] office [phone] mobile phone

## 2018-02-19 NOTE — ED Notes (Signed)
Patient is very hard of hearing.

## 2018-02-19 NOTE — ED Notes (Signed)
Came into pt room to change dressing. Pt did not have on nasal canula and O2 sat was 85% on room air. Pt family said they removed it because it was bothersome to the patient. Instructed family on importance of leaving [place] on.

## 2018-02-19 NOTE — ED Notes (Signed)
Unable to follow commands well enough for a swallow eval. Pt has remained NPO

## 2018-02-19 NOTE — Progress Notes (Signed)
Patient not cooperating /participating with Neuro assessment.

## 2018-02-19 NOTE — Progress Notes (Signed)
OT Cancellation Note  Patient Details Name: Amy Sawyer MRN: [id number] DOB: 02-03-1931   Cancelled Treatment:    Reason Eval/Treat Not Completed: Patient at procedure or test/ unavailable   Ailene Ravel, OTR/L,CBIS  [phone]  02/19/2018, 9:48 AM

## 2018-02-19 NOTE — Progress Notes (Signed)
Initial Nutrition Assessment  DOCUMENTATION CODES:   Severe malnutrition in context of acute illness/injury  INTERVENTION:  Once diet advances from NPO:  Monitor PO intake to meet needs.  Order Ensure Enlive po BID, each supplement provides 350 kcal and 20 grams of protein  NUTRITION DIAGNOSIS:   Severe Malnutrition related to decreased appetite, acute illness as evidenced by energy intake < or equal to 50% for > or equal to 5 days, severe muscle depletion.  GOAL:   Patient will meet greater than or equal to 90% of their needs  MONITOR:   PO intake, Supplement acceptance, Weight trends, Skin  REASON FOR ASSESSMENT:   Malnutrition Screening Tool   ASSESSMENT:  84 y/o female with Hx of HTN, HDL, hard of hearing, dementia, and stroke.  Admitted for AMS and fever.  MST score of 2.  Pt is very hard of hearing, so most of Hx obtained from son-in-law.    Pt lives with husband and youngest daughter.  She prepares her own food, which son-in-law says lately has been peanut butter sandwiches.  Pt did manage to hear enough to answer that she eats a lot of vegetables and son-in-law adds that pt does not eat meat.  For clarification, RD intern asked if pt was vegetarian and son-in-law stated that not eating meat, "just sort of happened".  He says that pt's appetite for the past 7-10 days before the hospital has been poor.  He says that pt "eats like a bird" normally, but for the past week to 10 days, she has been eating even less that usual.    Pt managed to hear enough to answer that her UBW is 119 lb.  While son-in-law initially said he "did not have the foggiest" about a UBW for pt, after pt said this, he said that she is usually somewhere in the 120s.  Pt's CBW is 123 lbs.  With pt within her normal BW range, there do not seem to be any alarming wt changes, however, there is also limited wt hx to go off of as her last recorded wt before this encounter was in 2017 at 137 lbs.  This 11% wt  loss over >1.5 years is not significant for malnutrition.  A NFPE, however, showed some signs of muscle depletion from moderate to severe.  It is difficult to determine if this is true malnutrition or sarcopenia, but as pt has had a recent hx of poor appetite of 7-10 days, both her muscle depletion and low energy intake do qualify her for severe malnutrition in context of an acute illness.    Pt's diet right now is NPO.  If her diet advances, intervention will include monitoring intake since appetite has been poor and Ensure Enlive BID to supplement calories/protein as nutrient intake from future meals is predicted to be sub-optimal.    Labs reviewed: Glucose 101, ALT 10 Recent Labs  Lab 02/19/18 0128  NA 137  K 3.6  CL 102  CO2 26  BUN 9  CREATININE 0.85  CALCIUM 9.0  GLUCOSE 101*   Medications: Lovenox, dulcolax, saline   Past Medical History:  Diagnosis Date  . HTN (hypertension)   . Hyperlipemia   . Stroke [facility])    NUTRITION - FOCUSED PHYSICAL EXAM:    Most Recent Value  Orbital Region  Moderate depletion  Upper Arm Region  No depletion  Temple Region  Severe depletion  Clavicle Bone Region  No depletion  Clavicle and Acromion Bone Region  Severe depletion  Dorsal Hand  Moderate depletion [also depletion seen between metacarpals.  ]  Anterior Thigh Region  Severe depletion  Posterior Calf Region  Moderate depletion  Edema (RD Assessment)  None  Skin  Reviewed  Nails  Reviewed     Diet Order:   Diet Order           Diet NPO time specified  Diet effective now         EDUCATION NEEDS:   No education needs have been identified at this time  Skin:  Skin Assessment: Skin Integrity Issues: Skin Integrity Issues:: Incisions Incisions: Laceration: left arm - dressing: dry, clean, intact.  Last BM:  no recorded value  Height:   Ht Readings from Last 1 Encounters:  02/19/18 5' (1.524 m)   Weight:   Wt Readings from Last 1 Encounters:  02/19/18 123 lb 10.9  oz (56.1 kg)   Ideal Body Weight:  45.4 kg  BMI:  Body mass index is 24.15 kg/m.  Estimated Nutritional Needs:   Kcal:  1400-1680 kcal (25-30 kcal/kg BW)  Protein:  70-84 g (20% kcal)  Fluid:  >1400 mL (25 mL/kg BW)

## 2018-02-19 NOTE — ED Notes (Signed)
Pt back from X-ray.  

## 2018-02-19 NOTE — Progress Notes (Signed)
02/19/2018 2:49 PM  I made several attempts to contact husband and daughter but no answer to both numbers and not able to leave a message.    Murvin Natal, MD

## 2018-02-19 NOTE — H&P (Signed)
History and Physical    Amy Sawyer FYB:[id number] DOB: 1931/02/26 DOA: 02/19/2018  PCP: Asencion Noble, MD   Patient coming from: Home.  I have personally briefly reviewed patient's old medical records in [place]  Chief Complaint: Fever and AMS.  HPI: Amy Sawyer is a 84 y.o. female with medical history significant of hypertension, hyperlipidemia, previous CVA who is coming to the emergency department due to fever and altered mental status with her daughters reporting that around 72 on Sunday the patient was talking loud, disoriented, having difficulty finding what to say and slurred speech.  Some of her symptoms were similar to what she had previously, when she presented with CVA.  She had an accidental fall yesterday causing a left arm skin tear.  The daughters are not certain if she had any type of head trauma during the fall, as this was not witnessed, but she was reported acting normal after the fall.  She is unable to provide further history at this time as she is still confused.  ED Course: Initial vital signs temperature 99.1 F, pulse 82, respirations 26, blood pressure 157/97 mmHg and O2 sat 100% on nasal cannula oxygen.  Her work-up shows an urinalysis with a hazy appearance, moderate hemoglobinuria, 5 mg a deciliter of ketonuria and more than 50 RBC per hpf on microscopic exam.  Her CBC showed a white count of 6.5 with 80% neutrophils, 12% lymphocytes and 5% monocytes.  Her hemoglobin was 13.1 g/dL and platelets 214.  Lactic acid x2 was normal.  CMP shows a glucose of 101 mg/dL and ALT decrease at 10 units/L, all other values were normal.  Imaging: Chest radiograph showed cardiomegaly, but no acute cardiopulmonary disease.  There was a lucency beneath the diaphragm that was questionable for air distended bowel.  Abdominal x-ray showed nonobstructive bowel gas pattern with diffuse stool-filled colon.  There was no free air.  Left forearm did not show any acute osseous  abnormality.  CT head without contrast show a focal cortical hypodensity at the left parietal lobe, suspicious for acute to subacute infarct.  Further evaluation with MRI was suggested.  There was no hemorrhage.  There is atrophy with moderate chronic small basal ischemic changes of the white matter.  Please see images and full radiology report for further detail  Review of Systems: Unable to obtain due to hearing difficulty and event acuity.   Past Medical History:  Diagnosis Date  . HTN (hypertension)   . Hyperlipemia   . Stroke [facility])     Past Surgical History:  Procedure Laterality Date  . BREAST SURGERY    . LEG SURGERY       reports that she has never smoked. She has never used smokeless tobacco. She reports that she does not drink alcohol or use drugs.  Allergies  Allergen Reactions  . Demerol [Meperidine] Anaphylaxis  . Hydrocodone-Acetaminophen Nausea And Vomiting    Family History  Problem Relation Age of Onset  . Heart disease Unknown   . Diabetes Unknown   . Stroke Mother    Prior to Admission medications   Medication Sig Start Date End Date Taking? Authorizing Provider  clonazePAM (KLONOPIN) 0.5 MG tablet Take 0.25 mg by mouth 2 (two) times daily as needed for anxiety.   Yes [provider]  ramipril (ALTACE) 10 MG capsule Take 10 mg by mouth daily.   Yes [provider]  simvastatin (ZOCOR) 20 MG tablet Take 20 mg by mouth daily.   Yes  [provider]  amoxicillin (AMOXIL) 500 MG capsule Take 1 capsule (500 mg total) by mouth 3 (three) times daily. 07/24/16   Jola Schmidt, MD  GuaiFENesin (MUCINEX PO) Take 1 tablet by mouth 2 (two) times daily.    [provider]  Oxymetazoline HCl (NASAL SPRAY) 0.05 % SOLN Place 1 spray into the nose 2 (two) times daily.    [provider]  zolpidem (AMBIEN) 5 MG tablet Take 5 mg by mouth at bedtime. 02/19/14   [provider]    Physical Exam: Vitals:   02/19/18 0108  02/19/18 0130  BP: (!) 157/97 (!) 159/74  Pulse: 82 81  Resp: (!) 26 20  Temp: 99.1 F (37.3 C)   TempSrc: Rectal   SpO2: 100% 99%    Constitutional: NAD, calm, comfortable Eyes: PERRL, right eye proptosis. ENMT: Mucous membranes are mildly dry. Posterior pharynx clear of any exudate or lesions. Neck: normal, supple, no masses, no thyromegaly Respiratory: Tachypneic at 23 bpm, but clear to auscultation bilaterally, no wheezing, no crackles. No accessory muscle use.  Cardiovascular: Regular rate and rhythm, no murmurs / rubs / gallops. No extremity edema. 2+ pedal pulses. No carotid bruits.  Abdomen: Soft, no tenderness, no masses palpated. No hepatosplenomegaly. Bowel sounds positive.  Musculoskeletal: no clubbing / cyanosis. Good ROM, no contractures. Normal muscle tone.  Skin: Left forearm wound with mild surrounding erythema/edema, tender to palpation and with minimal bleeding.  No purulent discharge seen.  There are upper and lower extremities ecchymosis on multiple stages of healing (fresh on RUE and old on LLE).  Please see below pictures for further detail. Neurologic: Right eye ptosis, but the rest of CN 2-12 grossly intact. Sensation intact, DTR normal. Strength 5/5 in all 4.  Generalized weakness. Psychiatric: Alert and oriented x 2, disoriented to time and situation.  Mildly anxious mood.           Labs on Admission: I have personally reviewed following labs and imaging studies  CBC: Recent Labs  Lab 02/19/18 0128  WBC 6.5  NEUTROABS 5.3  HGB 13.1  HCT 40.5  MCV 90.4  PLT 562   Basic Metabolic Panel: Recent Labs  Lab 02/19/18 0128  NA 137  K 3.6  CL 102  CO2 26  GLUCOSE 101*  BUN 9  CREATININE 0.85  CALCIUM 9.0   GFR: CrCl cannot be calculated (Unknown ideal weight.). Liver Function Tests: Recent Labs  Lab 02/19/18 0128  AST 21  ALT 10*  ALKPHOS 47  BILITOT 0.7  PROT 6.8  ALBUMIN 4.0   No results for input(s): LIPASE, AMYLASE in the last  168 hours. No results for input(s): AMMONIA in the last 168 hours. Coagulation Profile: No results for input(s): INR, PROTIME in the last 168 hours. Cardiac Enzymes: No results for input(s): CKTOTAL, CKMB, CKMBINDEX, TROPONINI in the last 168 hours. BNP (last 3 results) No results for input(s): PROBNP in the last 8760 hours. HbA1C: No results for input(s): HGBA1C in the last 72 hours. CBG: No results for input(s): GLUCAP in the last 168 hours. Lipid Profile: No results for input(s): CHOL, HDL, LDLCALC, TRIG, CHOLHDL, LDLDIRECT in the last 72 hours. Thyroid Function Tests: No results for input(s): TSH, T4TOTAL, FREET4, T3FREE, THYROIDAB in the last 72 hours. Anemia Panel: No results for input(s): VITAMINB12, FOLATE, FERRITIN, TIBC, IRON, RETICCTPCT in the last 72 hours. Urine analysis: No results found for: COLORURINE, APPEARANCEUR, LABSPEC, PHURINE, GLUCOSEU, HGBUR, BILIRUBINUR, KETONESUR, PROTEINUR, UROBILINOGEN, NITRITE, LEUKOCYTESUR  Radiological Exams on Admission:  Dg Chest 2 View  Result Date: 02/19/2018 CLINICAL DATA:  Altered mental status EXAM: CHEST - 2 VIEW COMPARISON:  10/31/2017 FINDINGS: Mildly low lung volumes. Cardiomegaly with aortic atherosclerosis. No focal airspace disease or pleural effusion. No pneumothorax. Lucency beneath the left diaphragm. IMPRESSION: 1. Cardiomegaly with low lung volumes but no acute pulmonary infiltrate 2. Lucency beneath the left diaphragm, questionable for air distended bowel; consider CT evaluation to exclude pneumoperitoneum. Electronically Signed   By: Donavan Foil M.D.   On: 02/19/2018 02:27   Dg Forearm Left  Result Date: 02/19/2018 CLINICAL DATA:  Trauma, altered mental status EXAM: LEFT FOREARM - 2 VIEW COMPARISON:  None. FINDINGS: No fracture or malalignment.  No significant elbow effusion IMPRESSION: No acute osseous abnormality Electronically Signed   By: Donavan Foil M.D.   On: 02/19/2018 02:28   Ct Head Wo Contrast  Result  Date: 02/19/2018 CLINICAL DATA:  Altered LOC EXAM: CT HEAD WITHOUT CONTRAST TECHNIQUE: Contiguous axial images were obtained from the base of the skull through the vertex without intravenous contrast. COMPARISON:  CT brain 03/22/2014 FINDINGS: Brain: No hemorrhage or intracranial mass is visualized. Moderate-to-marked atrophy. Moderate small vessel ischemic changes of the white matter. Focal hypodensity within the left parietal lobe. Stable ventricle size. Vascular: No hyperdense vessels. Vertebral and carotid vascular calcification. Skull: No fracture Sinuses/Orbits: No acute finding. Other: None IMPRESSION: 1. Focal cortical hypodensity at the left parietal lobe, suspicious for acute to subacute infarct. Further evaluation with MRI is suggested. No hemorrhage. 2. Atrophy with moderate small vessel ischemic changes of the white matter. Electronically Signed   By: Donavan Foil M.D.   On: 02/19/2018 02:34    EKG: Independently reviewed. Vent. rate 82 BPM PR interval * ms QRS duration 95 ms QT/QTc 361/422 ms P-R-T axes -23 -18 -15 Sinus rhythm Borderline left axis deviation Borderline repolarization abnormality Baseline wander in lead(s) I III aVL  Assessment/Plan Principal Problem:   Altered mental status Given CT findings, age and previous history will observe for TIA/CVA work-up. Continue frequent neuro checks. Keep n.p.o. SLP evaluation. PT/OT. Check hemoglobin A1c and fasting lipids. Check carotid Doppler and echocardiogram. Check MR MRA to brain in the morning. Consult neurology if scan is positive for CVA.  Active Problems:   Fever  No obvious source, normal white count and lactic acid. Blood cultures have been drawn in the ED. Continue IV fluids, but defer IV antibiotics for now. Follow-up blood cultures. Consider repeat sepsis work-up if fever recurs.    Hyperlipemia Resume simvastatin once cleared for diet.    Hypertension Hold ramipril for now. Monitor blood  pressure. Allow permissive hypertension up to 220/110 mmHg.   DVT prophylaxis: Lovenox SQ. Code Status: Full code. Family Communication: Her daughters were present in the ED room. Disposition Plan: Admit for TIA work-up and close clinical monitoring. Consults called:  Admission status: Observation/telemetry.   Reubin Milan MD [facility] Pager [phone].  If 7PM-7AM, please contact night-coverage [email or web address] Password [email or web address]  02/19/2018, 3:22 AM

## 2018-02-19 NOTE — Progress Notes (Signed)
*  PRELIMINARY RESULTS* Echocardiogram 2D Echocardiogram has been performed.  Leavy Cella 02/19/2018, 11:57 AM

## 2018-02-19 NOTE — Progress Notes (Signed)
02/19/18 9:26 AM  Patient has been examined and admitted after midnight. Patient is clearly altered due to dementia. She has a significant stool burden present on abdominal xrays. She has had no bowel movements since admission. Since patient is NPO will treat with Dulcolax suppository. CT abdomin and pelvic ordered to rule out pneumoperitonitis per radiology. MRI and CT is pending.  Gaston Islam, Student AGACNP  Attending:  Irwin Brakeman, MD

## 2018-02-19 NOTE — Clinical Social Work Note (Signed)
Clinical Social Work Assessment  Patient Details  Name: Amy Sawyer MRN: [id number] Date of Birth: [birth year]  Date of referral:  02/19/18               Reason for consult:  Facility Placement                Permission sought to share information with:    Permission granted to share information::     Name::        Agency::     Relationship::     Contact Information:  spouse, Mr. Lio and son-in-law, Gwyndolyn Saxon "Billy" Hibbs  Housing/Transportation Living arrangements for the past 2 months:  Single Family Home Source of Information:  Patient Patient Interpreter Needed:  None Criminal Activity/Legal Involvement Pertinent to Current Situation/Hospitalization:  No - Comment as needed Significant Relationships:  None Lives with:  Spouse Do you feel safe going back to the place where you live?  No Need for family participation in patient care:  Yes (Comment)  Care giving concerns:  None identified at baseline.    Social Worker assessment / plan:  At baseline, patient uses a cane when out in the community and a walker in the home. She is independent in her ADLs. Patient lives with her spouse, daughter and grandson.  Family states patient will be happier at home and does not desire SNF. They report that the desire HHPT.  LCSW provided a SNF list in the event that they reconsidered.  LCSW signing off. Please resconsult if CSW needs arise.   Employment status:  Retired Forensic scientist:  Medicare PT Recommendations:  [place] / Referral to community resources:  [place]  Patient/Family's Response to care:  Family desires HHPT.   Patient/Family's Understanding of and Emotional Response to Diagnosis, Current Treatment, and Prognosis:  Patient and family verbalize understanding of patient's diagnosis, treatment and prognosis and feel that patient will recover best in her own home.  Emotional Assessment Appearance:  Appears stated  age Attitude/Demeanor/Rapport:    Affect (typically observed):  Calm Orientation:  Oriented to  Time, Oriented to Place, Oriented to Self, Oriented to Situation Alcohol / Substance use:  Not Applicable Psych involvement (Current and /or in the community):  No (Comment)  Discharge Needs  Concerns to be addressed:  Discharge Planning Concerns Readmission within the last 30 days:  No Current discharge risk:  None Barriers to Discharge:  No Barriers Identified   Ihor Gully, LCSW 02/19/2018, 3:25 PM

## 2018-02-19 NOTE — Evaluation (Signed)
Speech Language Pathology Evaluation Patient Details Name: [name] MRN: [id number] DOB: 04-22-1931 Today's Date: 02/19/2018 Time: 2229-7989 SLP Time Calculation (min) (ACUTE ONLY): 38 min  Problem List:  Patient Active Problem List   Diagnosis Date Noted  . Altered mental status 02/19/2018  . Hyperlipemia 02/19/2018  . Hypertension 02/19/2018  . Fever 02/19/2018  . Acute CVA (cerebrovascular accident) ([place]) 02/19/2018  . Protein-calorie malnutrition, severe 02/19/2018  . Hyponatremia 03/22/2014  . Pain in joint, shoulder region 04/09/2012  . Muscle weakness (generalized) 04/09/2012  . Proximal humerus fracture 03/13/2012   Past Medical History:  Past Medical History:  Diagnosis Date  . HTN (hypertension)   . Hyperlipemia   . Stroke [facility])    Past Surgical History:  Past Surgical History:  Procedure Laterality Date  . BREAST SURGERY    . LEG SURGERY     HPI:  Amy Sawyer is a 84 y.o. female with medical history significant of hypertension, hyperlipidemia, previous CVA who is coming to the emergency department due to fever and altered mental status with her daughters reporting that around 95 on Sunday the patient was talking loud, disoriented, having difficulty finding what to say and slurred speech.  Some of her symptoms were similar to what she had previously, when she presented with CVA.  She had an accidental fall yesterday causing a left arm skin tear.  The daughters are not certain if she had any type of head trauma during the fall, as this was not witnessed, but she was reported acting normal after the fall.  She is unable to provide further history at this time as she is still confused. MRA/MRI Findings: Moderate acute infarct in the left parietal cortex; Small remote left posterior frontal infarct.   Assessment / Plan / Recommendation Clinical Impression  SLE administered this day.  Pt was alert and oriented.  Pt was difficult to assess due to impaired  hearing and vision status.  Hearing aids were brought in by family but Pt continued to demonstrate difficulty hearing/understanding/following directions.  Pt's glasses were not present on evaluation.  Pt demonstrated perseveration, word finding diffiuclty with "garbled speech" intermittently, which appears to be an acute onset.  Memory deficits noted but appear to be present at baseline. Note, additional family entered during the evaluation and Pt greeted family members by name and appeared joyful.  SLP will follow during hospitalization. Follow up with a speech-language pathologist is recommended upon transfer.  SNF recommend as next venue of care.    SLP Assessment  SLP Recommendation/Assessment: Patient needs continued Speech Lanaguage Pathology Services SLP Visit Diagnosis: Aphasia (R47.01)    Follow Up Recommendations  Skilled Nursing facility    Frequency and Duration min 3x week  1 week      SLP Evaluation Cognition  Overall Cognitive Status: Difficult to assess Arousal/Alertness: Awake/alert Orientation Level: Oriented to person;Oriented to place;Oriented to time;Oriented to situation Behaviors: Perseveration Safety/Judgment: Appears intact       Comprehension  Auditory Comprehension Yes/No Questions: Within Functional Limits Commands: Impaired One Step Basic Commands: Other (comment) Conversation: (Difficulty following directions, possibly due to hearing sts) Interfering Components: Hearing;Attention;Working Field seismologist: Extra processing time;Increased volume;Repetition;Slowed speech;Stressing words;Visual/Gestural cues Visual Recognition/Discrimination Discrimination: Not tested(Unable to assess; Pt did  not have glasses) Reading Comprehension Reading Status: Unable to assess (comment)    Expression Expression Primary Mode of Expression: Verbal Verbal Expression Overall Verbal Expression: Impaired Initiation: No impairment Written Expression Dominant  Hand: Right   Oral / Motor  Oral Motor/Sensory  Function Overall Oral Motor/Sensory Function: Other (comment) Facial ROM: (Smiled with cuing) Facial Symmetry: (Pt presents with right ptosis) Lingual ROM: (Pt hard of hearing; unable to follow oral mech commands) Motor Speech Phonation: Normal Resonance: Within functional limits Articulation: Impaired Level of Impairment: Word Intelligibility: Intelligibility reduced   GO            Joneen Boers  M.A., CCC-SLP [email or web address] Ogren Loletha Bertini 02/19/2018, 3:00 PM

## 2018-02-19 NOTE — ED Notes (Signed)
Family reports that pt started talking in "jibberish" around 4 pm 02/18/2018, had a fall two days ago, redness noted to wound on left forearm,

## 2018-02-19 NOTE — Evaluation (Signed)
Clinical/Bedside Swallow Evaluation Patient Details  Name: Amy Sawyer MRN: [id number] Date of Birth: Jul 20, 1931  Today's Date: 02/19/2018 Time: SLP Start Time (ACUTE ONLY): 1159 SLP Stop Time (ACUTE ONLY): 1239 SLP Time Calculation (min) (ACUTE ONLY): 40 min  Past Medical History:  Past Medical History:  Diagnosis Date  . HTN (hypertension)   . Hyperlipemia   . Stroke [facility])    Past Surgical History:  Past Surgical History:  Procedure Laterality Date  . BREAST SURGERY    . LEG SURGERY     HPI:  Amy Sawyer is a 84 y.o. female with medical history significant of hypertension, hyperlipidemia, previous CVA who is coming to the emergency department due to fever and altered mental status with her daughters reporting that around 55 on Sunday the patient was talking loud, disoriented, having difficulty finding what to say and slurred speech.  Some of her symptoms were similar to what she had previously, when she presented with CVA.  She had an accidental fall yesterday causing a left arm skin tear.  The daughters are not certain if she had any type of head trauma during the fall, as this was not witnessed, but she was reported acting normal after the fall.  She is unable to provide further history at this time as she is still confused. MRA/MRI Findings: Moderate acute infarct in the left parietal cortex; Small remote left posterior frontal infarct.   Assessment / Plan / Recommendation Clinical Impression  Clinical swallow evlauation along with oral care completed at bedside with family present.  Family stated Pt does not consume meat and was on a soft diet with thin liquids at home.  Pt was alert and orientedx3 but demonstrated word finding difficulty, perseveration and distorted speech.  Pt presents with right ptosis and difficulty following directions for oral motor exam (SLE ordered and completed on this visit).  She consumed ice chips, water and puree without overt s/sx of aspiration;  however, she refused additional bites of puree, stating, "I just want to rest".  Family stated she did not have prior difficulty swallowing but she has not been eating much at home of late.  Pt is missing most dentition and dentures were not donned at time of evaluation.  Given Pt lethargy and tiring during PO trials, SLP will follow for diet tolerance and additional assessment, if indicated.  Nursing notified of plan.  Recommend small, frequent meals to reduce tiring and encourage adequate nutrition.  Recommend oral care before and after meals. SLP Visit Diagnosis: Dysphagia, unspecified (R13.10)    Aspiration Risk  Risk for inadequate nutrition/hydration    Diet Recommendation Dysphagia 1 (Puree);Thin liquid   Liquid Administration via: Cup Medication Administration: Whole meds with liquid Supervision: Patient able to self feed;Staff to assist with self feeding Compensations: Minimize environmental distractions;Slow rate;Small sips/bites Postural Changes: Seated upright at 90 degrees;Remain upright for at least 30 minutes after po intake    Other  Recommendations Oral Care Recommendations: Oral care before and after PO   Follow up Recommendations Skilled Nursing facility      Frequency and Duration min 2x/week  1 week       Prognosis Prognosis for Safe Diet Advancement: Good Barriers to Reach Goals: Language deficits;Other (Comment)(Hearing impaired)      Swallow Study   General Date of Onset: 02/19/18 HPI: Amy Sawyer is a 84 y.o. female with medical history significant of hypertension, hyperlipidemia, previous CVA who is coming to the emergency department due to fever and altered  mental status with her daughters reporting that around 53 on Sunday the patient was talking loud, disoriented, having difficulty finding what to say and slurred speech.  Some of her symptoms were similar to what she had previously, when she presented with CVA.  She had an accidental fall yesterday  causing a left arm skin tear.  The daughters are not certain if she had any type of head trauma during the fall, as this was not witnessed, but she was reported acting normal after the fall.  She is unable to provide further history at this time as she is still confused. MRA/MRI Findings: Moderate acute infarct in the left parietal cortex; Small remote left posterior frontal infarct. Type of Study: Bedside Swallow Evaluation Diet Prior to this Study: Other (Comment)(Per family, soft diet/thins/no meat) Temperature Spikes Noted: No Respiratory Status: Room air Behavior/Cognition: Alert;Cooperative;Requires cueing(Following directions requires repetition and cuing) Oral Cavity Assessment: Within Functional Limits Oral Care Completed by SLP: Yes Oral Cavity - Dentition: Dentures, not available;Poor condition;Missing dentition Vision: Functional for self-feeding Self-Feeding Abilities: Able to feed self;Needs set up Patient Positioning: Upright in bed;Postural control adequate for testing Baseline Vocal Quality: Normal Volitional Cough: (Moderate; required gesture to elicit) Volitional Swallow: Able to elicit    Oral/Motor/Sensory Function Overall Oral Motor/Sensory Function: Other (comment) Facial ROM: (Smiled with cuing) Facial Symmetry: (Pt presents with right ptosis) Lingual ROM: (Pt hard of hearing; unable to follow oral mech commands)   Ice Chips Ice chips: Within functional limits Presentation: Spoon   Thin Liquid Thin Liquid: Within functional limits Presentation: Cup;Straw;Self Fed    Nectar Thick Nectar Thick Liquid: Not tested   Honey Thick Honey Thick Liquid: Not tested   Puree Puree: Within functional limits Presentation: Self Fed;Spoon Other Comments: Refused additional bites after first presentation   Solid   GO   Solid: Not tested Other Comments: Pt consumed a soft diet prior to hospitalization   Amy Sawyer  M.A., CCC-SLP [email or web address]        Amy Sawyer  M.A., CCC-SLP [email or web address] Ogren Neizan Debruhl 02/19/2018,2:25 PM

## 2018-02-19 NOTE — Evaluation (Signed)
Physical Therapy Evaluation Patient Details Name: Amy Sawyer MRN: [id number] DOB: 11/24/1930 Today's Date: 02/19/2018   History of Present Illness  Amy Sawyer is a 84 y.o. female with medical history significant of hypertension, hyperlipidemia, previous CVA who is coming to the emergency department due to fever and altered mental status with her daughters reporting that around 70 on Sunday the patient was talking loud, disoriented, having difficulty finding what to say and slurred speech.  Some of her symptoms were similar to what she had previously, when she presented with CVA.  She had an accidental fall yesterday causing a left arm skin tear.  The daughters are not certain if she had any type of head trauma during the fall, as this was not witnessed, but she was reported acting normal after the fall.  She is unable to provide further history at this time as she is still confused.    Clinical Impression  Patient very hard of hearing and family to bring her hearing aides to hospital.  Patient requires repeated VC's to complete tasks, very impulsive when having to walk to bathroom to urinate, lack of awareness of purwick urinal, demonstrates slow labored cadence with difficulty correctly placing right hand on walker due to weakness and possibly decreased proprioception, limited secondary to c/o fatigue and tolerated sitting up in chair with family member present at bedside - RN aware.  Patient will benefit from continued physical therapy in hospital and recommended venue below to increase strength, balance, endurance for safe ADLs and gait.    Follow Up Recommendations SNF;Supervision/Assistance - 24 hour    Equipment Recommendations  None recommended by PT    Recommendations for Other Services       Precautions / Restrictions Precautions Precautions: Fall Restrictions Weight Bearing Restrictions: No      Mobility  Bed Mobility Overal bed mobility: Needs Assistance Bed  Mobility: Supine to Sit     Supine to sit: Min guard     General bed mobility comments: labored movement  Transfers Overall transfer level: Needs assistance Equipment used: Rolling walker (2 wheeled) Transfers: Sit to/from [facility] Sit to Stand: Min guard Stand pivot transfers: Min assist       General transfer comment: labored movement, impulsive possibly due to hard of hearing  Ambulation/Gait Ambulation/Gait assistance: Min assist Gait Distance (Feet): 35 Feet Assistive device: Rolling walker (2 wheeled) Gait Pattern/deviations: Decreased step length - right;Decreased step length - left;Decreased stride length Gait velocity: slow   General Gait Details: demonstrates slow labored cadence, has difficulty gripping with right hand with occasonal sliding foward without loss of grip on RW handle, limited for gait secondary to c/o fatigue  Stairs            Wheelchair Mobility    Modified Rankin (Stroke Patients Only)       Balance                                             Pertinent Vitals/Pain Pain Assessment: No/denies pain    Home Living Family/patient expects to be discharged to:: Private residence Living Arrangements: Spouse/significant other;Children Available Help at Discharge: Family(daughter) Type of Home: House Home Access: Ramped entrance     Home Layout: Two level Home Equipment: Cane - single point;Walker - 2 wheels;Bedside commode;Wheelchair - manual      Prior Function Level of Independence: Independent with  assistive device(s)         Comments: household, 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[facility] [employer]     Hand Dominance   Dominant Hand: Right    Extremity/Trunk Assessment   Upper Extremity Assessment Upper Extremity Assessment: Defer to OT evaluation    Lower Extremity Assessment Lower Extremity Assessment: Generalized weakness    Cervical / Trunk Assessment Cervical / Trunk  Assessment: Normal  Communication   Communication: HOH(has hearing aides at home, family to bring them to hospital)  Cognition Arousal/Alertness: Awake/alert Behavior During Therapy: WFL for tasks assessed/performed Overall Cognitive Status: Within Functional Limits for tasks assessed                                        General Comments      Exercises     Assessment/Plan    PT Assessment Patient needs continued PT services  PT Problem List Decreased strength;Decreased activity tolerance;Decreased balance;Decreased mobility       PT Treatment Interventions Gait training;Stair training;Functional mobility training;Therapeutic activities;Therapeutic exercise;Patient/family education    PT Goals (Current goals can be found in the Care Plan section)  Acute Rehab PT Goals Patient Stated Goal: return home PT Goal Formulation: With patient/family Time For Goal Achievement: 03/05/18 Potential to Achieve Goals: Good    Frequency 7X/week   Barriers to discharge        Co-evaluation               AM-PAC PT "6 Clicks" Daily Activity  Outcome Measure Difficulty turning over in bed (including adjusting bedclothes, sheets and blankets)?: None Difficulty moving from lying on back to sitting on the side of the bed? : A Little Difficulty sitting down on and standing up from a chair with arms (e.g., wheelchair, bedside commode, etc,.)?: A Little Help needed moving to and from a bed to chair (including a wheelchair)?: A Little Help needed walking in hospital room?: A Little Help needed climbing 3-5 steps with a railing? : A Lot 6 Click Score: 18    End of Session   Activity Tolerance: Patient tolerated treatment well;Patient limited by fatigue Patient left: in chair;with call bell/phone within reach;with family/visitor present;with chair alarm set Nurse Communication: Mobility status;Other (comment)(RN aware patient left up in chair) PT Visit Diagnosis:  Unsteadiness on feet (R26.81);Other abnormalities of gait and mobility (R26.89);Muscle weakness (generalized) (M62.81)    Time: 7510-2585 PT Time Calculation (min) (ACUTE ONLY): 26 min   Charges:   PT Evaluation $PT Eval Moderate Complexity: 1 Mod PT Treatments $Therapeutic Activity: 23-37 mins   PT G Codes:        1:57 PM, 12-Mar-2018 Lonell Grandchild, MPT Physical Therapist with [facility] 336 [phone] office [phone] mobile phone

## 2018-02-20 ENCOUNTER — Inpatient Hospital Stay (HOSPITAL_COMMUNITY)
Admit: 2018-02-20 | Discharge: 2018-02-20 | Disposition: A | Discharge status: Home / Self Care | Payer: Medicare Other | Attending: Neurology | Admitting: Neurology

## 2018-02-20 DIAGNOSIS — E43 Unspecified severe protein-calorie malnutrition: Secondary | ICD-10-CM

## 2018-02-20 DIAGNOSIS — I10 Essential (primary) hypertension: Secondary | ICD-10-CM

## 2018-02-20 DIAGNOSIS — E785 Hyperlipidemia, unspecified: Secondary | ICD-10-CM

## 2018-02-20 DIAGNOSIS — I639 Cerebral infarction, unspecified: Principal | ICD-10-CM

## 2018-02-20 LAB — CBC WITH DIFFERENTIAL/PLATELET
Basophils Absolute: 0 10*3/uL (ref 0.0–0.1)
Basophils Relative: 1 %
EOS ABS: 0.1 10*3/uL (ref 0.0–0.7)
Eosinophils Relative: 3 %
HEMATOCRIT: 39.8 % (ref 36.0–46.0)
HEMOGLOBIN: 12.4 g/dL (ref 12.0–15.0)
LYMPHS ABS: 1.1 10*3/uL (ref 0.7–4.0)
Lymphocytes Relative: 22 %
MCH: 28.2 pg (ref 26.0–34.0)
MCHC: 31.2 g/dL (ref 30.0–36.0)
MCV: 90.7 fL (ref 78.0–100.0)
MONO ABS: 0.5 10*3/uL (ref 0.1–1.0)
MONOS PCT: 9 %
NEUTROS PCT: 65 %
Neutro Abs: 3.3 10*3/uL (ref 1.7–7.7)
Platelets: 216 10*3/uL (ref 150–400)
RBC: 4.39 MIL/uL (ref 3.87–5.11)
RDW: 13.8 % (ref 11.5–15.5)
WBC: 5.1 10*3/uL (ref 4.0–10.5)

## 2018-02-20 LAB — COMPREHENSIVE METABOLIC PANEL
ALBUMIN: 3.3 g/dL — AB (ref 3.5–5.0)
ALT: 10 U/L — AB (ref 14–54)
AST: 16 U/L (ref 15–41)
Alkaline Phosphatase: 40 U/L (ref 38–126)
Anion gap: 6 (ref 5–15)
BUN: 7 mg/dL (ref 6–20)
CHLORIDE: 108 mmol/L (ref 101–111)
CO2: 25 mmol/L (ref 22–32)
CREATININE: 0.8 mg/dL (ref 0.44–1.00)
Calcium: 8.6 mg/dL — ABNORMAL LOW (ref 8.9–10.3)
GFR calc non Af Amer: >60 mL/min (ref >60)
GLUCOSE: 86 mg/dL (ref 65–99)
Potassium: 3.6 mmol/L (ref 3.5–5.1)
SODIUM: 139 mmol/L (ref 135–145)
Total Bilirubin: 0.8 mg/dL (ref 0.3–1.2)
Total Protein: 5.8 g/dL — ABNORMAL LOW (ref 6.5–8.1)

## 2018-02-20 MED ORDER — ASPIRIN EC 325 MG PO TBEC: 325.0000 mg | DELAYED_RELEASE_TABLET | Freq: Every day | ORAL | Status: DC

## 2018-02-20 MED ORDER — ASPIRIN 325 MG PO TBEC: 325.0000 mg | DELAYED_RELEASE_TABLET | Freq: Every day | ORAL | 0 refills | Status: DC

## 2018-02-20 NOTE — Progress Notes (Addendum)
Physical Therapy Treatment Patient Details Name: Amy Sawyer MRN: [id number] DOB: November 01, 1930 Today's Date: 02/20/2018    History of Present Illness Amy Sawyer is a 84 y.o. female with medical history significant of hypertension, hyperlipidemia, previous CVA who is coming to the emergency department due to fever and altered mental status with her daughters reporting that around 81 on Sunday the patient was talking loud, disoriented, having difficulty finding what to say and slurred speech.  Some of her symptoms were similar to what she had previously, when she presented with CVA.  She had an accidental fall yesterday causing a left arm skin tear.  The daughters are not certain if she had any type of head trauma during the fall, as this was not witnessed, but she was reported acting normal after the fall.  She is unable to provide further history at this time as she is still confused.    PT Comments    Pt supine in bed with RN in room removing IV from arm, pt very hard of hearing though did agree to participate with therapist today.  Pt had removed O2 assistance from nose with 96% O2 saturation at room air.  Pt with need to urinate initially, unaware of purwick urinal and fast movements.  Pt attempted to walk to restroom without RW, holding on the walls for safety.  Pt educated on benefits of use of RW to assist with balance while walking.  Increased distance and cadence with gait training, pt with tendency to swerve while walking, no LOB with use of RW.  EOS pt left in chair with call bell and telephone within reach, chair alarm set and RN aware of status to replace the purwick urinal following gait.     Follow Up Recommendations  SNF;Supervision/Assistance - 24 hour     Equipment Recommendations       Recommendations for Other Services       Precautions / Restrictions Precautions Precautions: Fall Restrictions Weight Bearing Restrictions: No    Mobility  Bed Mobility Overal  bed mobility: Independent             General bed mobility comments: Pt independent with bed mobility  Transfers Overall transfer level: Modified independent Equipment used: Rolling walker (2 wheeled) Transfers: Sit to/from Stand Sit to Stand: Min guard         General transfer comment: Pt fast movements, cueing to slow down for safety.  Ambulation/Gait Ambulation/Gait assistance: Min assist Gait Distance (Feet): 150 Feet Assistive device: Rolling walker (2 wheeled) Gait Pattern/deviations: Decreased step length - right;Decreased step length - left;Decreased stride length Gait velocity: increased cadence   General Gait Details: Pt attempted to ambulate to bathroom wihtout AD, fast movements somewhat unsafe though no LOB during session.  Educated on benefits for safety with RW, pt very hard of hearing.     Stairs             Wheelchair Mobility    Modified Rankin (Stroke Patients Only)       Balance                                            Cognition Arousal/Alertness: Awake/alert Behavior During Therapy: WFL for tasks assessed/performed Overall Cognitive Status: History of cognitive impairments - at baseline  Exercises      General Comments        Pertinent Vitals/Pain Pain Assessment: No/denies pain    Home Living                      Prior Function            PT Goals (current goals can now be found in the care plan section)      Frequency    7X/week      PT Plan      Co-evaluation              AM-PAC PT "6 Clicks" Daily Activity  Outcome Measure  Difficulty turning over in bed (including adjusting bedclothes, sheets and blankets)?: None Difficulty moving from lying on back to sitting on the side of the bed? : A Little Difficulty sitting down on and standing up from a chair with arms (e.g., wheelchair, bedside commode, etc,.)?: A  Little Help needed moving to and from a bed to chair (including a wheelchair)?: A Little Help needed walking in hospital room?: A Little Help needed climbing 3-5 steps with a railing? : A Lot 6 Click Score: 18    End of Session Equipment Utilized During Treatment: Gait belt Activity Tolerance: Patient tolerated treatment well;Patient limited by fatigue Patient left: in chair;with call bell/phone within reach;with chair alarm set;with nursing/sitter in room(RN aware of status, requested to replace purwick urinal) Nurse Communication: Mobility status;Other (comment)(RN aware of pt in chair) PT Visit Diagnosis: Unsteadiness on feet (R26.81);Other abnormalities of gait and mobility (R26.89);Muscle weakness (generalized) (M62.81)     Time: 0981-1914 PT Time Calculation (min) (ACUTE ONLY): 18 min  Charges:  $Therapeutic Activity: 8-22 mins                    G Codes:       Ihor Austin, LPTA; CBIS [phone]   Aldona Lento 02/20/2018, 1:14 PM

## 2018-02-20 NOTE — Plan of Care (Signed)
  Problem: Acute Rehab OT Goals (only OT should resolve) Goal: Pt. Will Perform Eating Flowsheets (Taken 02/20/2018 0910) Pt Will Perform Eating: with modified independence;sitting Goal: Pt. Will Perform Lower Body Dressing Flowsheets (Taken 02/20/2018 0910) Pt Will Perform Lower Body Dressing: with supervision;sitting/lateral leans;sit to/from stand Goal: Pt. Will Transfer To Toilet Flowsheets (Taken 02/20/2018 0910) Pt Will Transfer to Toilet: with supervision;ambulating;regular height toilet;bedside commode Goal: Pt/Caregiver Will Perform Home Exercise Program Flowsheets (Taken 02/20/2018 0910) Pt/caregiver will Perform Home Exercise Program: Increased strength;Right Upper extremity;Independently;With written HEP provided Note:  Improved coordination

## 2018-02-20 NOTE — Discharge Summary (Signed)
Physician Discharge Summary  Amy Sawyer ERD:[id number] DOB: [birth year] DOA: 02/19/2018  PCP: Asencion Noble, MD Neurologist: Merlene Laughter  Admit date: 02/19/2018 Discharge date: 02/20/2018  Admitted From: Home  Disposition: home  Recommendations for Outpatient Follow-up:  1. Follow up with PCP in 1 weeks 2. Follow up with neurologist in 6 weeks 3. Please obtain BMP/CBC in one week 4. Follow up on 30 day event monitor that was arranged in hospital 5. Please follow up on the following pending results: EEG study Follow up with Dr. Merlene Laughter  Home Health: RN, PT, OT, SLP  Discharge Condition: STABLE   CODE STATUS: FULL    Brief Hospitalization Summary: Please see all hospital notes, images, labs for full details of the hospitalization. HPI: Amy Sawyer is a 84 y.o. female with medical history significant of hypertension, hyperlipidemia, previous CVA who is coming to the emergency department due to fever and altered mental status with her daughters reporting that around 66 on Sunday the patient was talking loud, disoriented, having difficulty finding what to say and slurred speech.  Some of her symptoms were similar to what she had previously, when she presented with CVA.  She had an accidental fall yesterday causing a left arm skin tear.  The daughters are not certain if she had any type of head trauma during the fall, as this was not witnessed, but she was reported acting normal after the fall.  She is unable to provide further history at this time as she is still confused.  ED Course: Initial vital signs temperature 99.1 F, pulse 82, respirations 26, blood pressure 157/97 mmHg and O2 sat 100% on nasal cannula oxygen.  Her work-up shows an urinalysis with a hazy appearance, moderate hemoglobinuria, 5 mg a deciliter of ketonuria and more than 50 RBC per hpf on microscopic exam.  Her CBC showed a white count of 6.5 with 80% neutrophils, 12% lymphocytes and 5% monocytes.  Her hemoglobin was  13.1 g/dL and platelets 214.  Lactic acid x2 was normal.  CMP shows a glucose of 101 mg/dL and ALT decrease at 10 units/L, all other values were normal.  Imaging: Chest radiograph showed cardiomegaly, but no acute cardiopulmonary disease.  There was a lucency beneath the diaphragm that was questionable for air distended bowel.  Abdominal x-ray showed nonobstructive bowel gas pattern with diffuse stool-filled colon.  There was no free air.  Left forearm did not show any acute osseous abnormality.  CT head without contrast show a focal cortical hypodensity at the left parietal lobe, suspicious for acute to subacute infarct.  Further evaluation with MRI was suggested.  There was no hemorrhage.  There is atrophy with moderate chronic small basal ischemic changes of the white matter.  Please see images and full radiology report for further detail.   1. Acute CVA - Further workup with MRI shows moderate acute infarct in the left parietal cortex, small remote left posterior frontal infarct. There appears to be no head trauma due to fall on scans. Suspect fall caused by stroke. Patient's mental status has returned to baseline. Her swallow study recommends pureed foods and thin consistencies. They also recommend SNF for rehab. PT followed up with patient and recommends SNF to assist with rehab for ADLs, gait, balance, strength, and endurance. Family has requested discharge to home with home health rehab nurse. Discharge home with [facility] PT, RN, OT, SLP per family request.  Neurologist saw patient and recommended ASA 325 mg daily and 30 day event monitor and EEG.  Pt had EEG in hospital. Pt to follow up with Dr. Merlene Laughter in 6 weeks.  I sent a request to cardiology office for them to send patient a 30 day event monitor to her home for testing.  Pt should receive in mail.    2. Fever -  RESOLVED.  No obvious source of fever in ED. Fluids were continued from the ED. Subsequent temperatures have been 98 degrees and 97.5. Fever  has resolved. 3. Hyperlipidemia - Continue home medications. 4. Hypertension - Continue home medications.   DVT prophylaxis: Lovenox Code Status: FULL CODE Family Communication: Husband and daughter at bedside. They have concerns about sending patient to rehab and want to do home rehab.  Disposition Plan: Home    Discharge Diagnoses:  Principal Problem:   Altered mental status Active Problems:   Hyperlipemia   Hypertension   Fever   Acute CVA (cerebrovascular accident) ([place])   Protein-calorie malnutrition, severe  Discharge Instructions: Discharge Instructions    Call MD for:  difficulty breathing, headache or visual disturbances   Complete by:  As directed    Call MD for:  extreme fatigue   Complete by:  As directed    Call MD for:  persistant dizziness or light-headedness   Complete by:  As directed    Call MD for:  persistant nausea and vomiting   Complete by:  As directed    Increase activity slowly   Complete by:  As directed      Allergies as of 02/20/2018      Reactions   Demerol [meperidine] Anaphylaxis   Hydrocodone-acetaminophen Nausea And Vomiting      Medication List    STOP taking these medications   amoxicillin 500 MG capsule Commonly known as:  AMOXIL     TAKE these medications   aspirin 325 MG EC tablet Take 1 tablet (325 mg total) by mouth daily. Start taking on:  02/21/2018   clonazePAM 0.5 MG tablet Commonly known as:  KLONOPIN Take 0.25 mg by mouth 2 (two) times daily as needed for anxiety.   ramipril 10 MG capsule Commonly known as:  ALTACE Take 10 mg by mouth daily.   simvastatin 20 MG tablet Commonly known as:  ZOCOR Take 20 mg by mouth daily.      Follow-up Information    Asencion Noble, MD. Schedule an appointment as soon as possible for a visit in 1 week(s).   Specialty:  Internal Medicine Contact information: [address] [place] [address] [phone]        Phillips Odor, MD. Schedule an appointment as  soon as possible for a visit in 6 week(s).   Specialty:  Neurology Why:  Hospital Follow Up Stroke Contact information: [address] [place] [address] [phone]          Allergies  Allergen Reactions  . Demerol [Meperidine] Anaphylaxis  . Hydrocodone-Acetaminophen Nausea And Vomiting   Allergies as of 02/20/2018      Reactions   Demerol [meperidine] Anaphylaxis   Hydrocodone-acetaminophen Nausea And Vomiting      Medication List    STOP taking these medications   amoxicillin 500 MG capsule Commonly known as:  AMOXIL     TAKE these medications   aspirin 325 MG EC tablet Take 1 tablet (325 mg total) by mouth daily. Start taking on:  02/21/2018   clonazePAM 0.5 MG tablet Commonly known as:  KLONOPIN Take 0.25 mg by mouth 2 (two) times daily as needed for anxiety.  ramipril 10 MG capsule Commonly known as:  ALTACE Take 10 mg by mouth daily.   simvastatin 20 MG tablet Commonly known as:  ZOCOR Take 20 mg by mouth daily.       Procedures/Studies: Dg Chest 2 View  Result Date: 02/19/2018 CLINICAL DATA:  Altered mental status EXAM: CHEST - 2 VIEW COMPARISON:  10/31/2017 FINDINGS: Mildly low lung volumes. Cardiomegaly with aortic atherosclerosis. No focal airspace disease or pleural effusion. No pneumothorax. Lucency beneath the left diaphragm. IMPRESSION: 1. Cardiomegaly with low lung volumes but no acute pulmonary infiltrate 2. Lucency beneath the left diaphragm, questionable for air distended bowel; consider CT evaluation to exclude pneumoperitoneum. Electronically Signed   By: Donavan Foil M.D.   On: 02/19/2018 02:27   Dg Forearm Left  Result Date: 02/19/2018 CLINICAL DATA:  Trauma, altered mental status EXAM: LEFT FOREARM - 2 VIEW COMPARISON:  None. FINDINGS: No fracture or malalignment.  No significant elbow effusion IMPRESSION: No acute osseous abnormality Electronically Signed   By: Donavan Foil M.D.   On: 02/19/2018 02:28   Dg Abdomen 1  View  Result Date: 02/19/2018 CLINICAL DATA:  Fall 2 days ago. EXAM: ABDOMEN - 2 VIEW; ABDOMEN - 1 VIEW COMPARISON:  CT abdomen and pelvis 03/22/2014 FINDINGS: Supine, upright, and decubitus views of the abdomen are obtained. Diffusely stool and gas-filled colon without abnormal distention. No small bowel distention. No free intra-abdominal air. No radiopaque stones. Thoracolumbar scoliosis and degenerative changes. Vascular calcifications. IMPRESSION: Nonobstructive bowel gas pattern with diffusely stool-filled colon. No free air. Electronically Signed   By: Lucienne Capers M.D.   On: 02/19/2018 04:34   Ct Head Wo Contrast  Result Date: 02/19/2018 CLINICAL DATA:  Altered LOC EXAM: CT HEAD WITHOUT CONTRAST TECHNIQUE: Contiguous axial images were obtained from the base of the skull through the vertex without intravenous contrast. COMPARISON:  CT brain 03/22/2014 FINDINGS: Brain: No hemorrhage or intracranial mass is visualized. Moderate-to-marked atrophy. Moderate small vessel ischemic changes of the white matter. Focal hypodensity within the left parietal lobe. Stable ventricle size. Vascular: No hyperdense vessels. Vertebral and carotid vascular calcification. Skull: No fracture Sinuses/Orbits: No acute finding. Other: None IMPRESSION: 1. Focal cortical hypodensity at the left parietal lobe, suspicious for acute to subacute infarct. Further evaluation with MRI is suggested. No hemorrhage. 2. Atrophy with moderate small vessel ischemic changes of the white matter. Electronically Signed   By: Donavan Foil M.D.   On: 02/19/2018 02:34   Mr Brain Wo Contrast  Result Date: 02/19/2018 CLINICAL DATA:  Altered mental status EXAM: MRI HEAD WITHOUT CONTRAST MRA HEAD WITHOUT CONTRAST TECHNIQUE: Multiplanar, multiecho pulse sequences of the brain and surrounding structures were obtained without intravenous contrast. Angiographic images of the head were obtained using MRA technique without contrast. COMPARISON:   Noncontrast head CT earlier today FINDINGS: MRI HEAD FINDINGS Significantly motion degraded diffusion, T1, and axial fluid sensitive sequences were acquired. There is a moderate area of restricted diffusion centered in the left parietal cortex. Smaller area of gliosis in the posterior left frontal lobe. No evidence of hemorrhage, hydrocephalus, collection, or mass. Mild atrophy and periventricular chronic small vessel ischemia. Xanthogranulomas of the choroid plexus. MRA HEAD FINDINGS Significantly motion degraded. Strong left vertebral artery dominance, known from cervical MRI in 2006. The vertebral, basilar, and carotid arteries are patent. Grossly symmetric flow related signal and bilateral MCA, PCA and ACA branches. IMPRESSION: Brain MRI: 1. Moderate acute infarct in the left parietal cortex. 2. Small remote left posterior frontal infarct. 3. Mild for  age atrophy and chronic small vessel disease. 4. Incomplete and severely motion degraded exam. Intracranial MRA: 1. Severely motion degraded. 2. No detectable proximal occlusion. Electronically Signed   By: Monte Fantasia M.D.   On: 02/19/2018 09:57   US Carotid Bilateral (at [facility] And [facility] Only)  Result Date: 02/19/2018 CLINICAL DATA:  History of stroke, hypertension and hyperlipidemia. EXAM: BILATERAL CAROTID DUPLEX ULTRASOUND TECHNIQUE: Pearline Cables scale imaging, color Doppler and duplex ultrasound were performed of bilateral carotid and vertebral arteries in the neck. COMPARISON:  03/23/2014 FINDINGS: Examination is degraded secondary to patient's inability to tolerate the procedure. Criteria: Quantification of carotid stenosis is based on velocity parameters that correlate the residual internal carotid diameter with NASCET-based stenosis levels, using the diameter of the distal internal carotid lumen as the denominator for stenosis measurement. The following velocity measurements were obtained: RIGHT ICA:  122/29 cm/sec CCA:  02/58 cm/sec SYSTOLIC ICA/CCA RATIO:   1.5 ECA:  79 cm/sec LEFT ICA:  72/15 cm/sec CCA:  52/77 cm/sec SYSTOLIC ICA/CCA RATIO:  0.8 ECA:  51 cm/sec RIGHT CAROTID ARTERY: There is a minimal amount of eccentric mixed echogenic plaque within the right carotid bulb (image 16), morphologically similar to the 03/2014 examination and again not resulting in elevated peak systolic velocities within the interrogated course the right internal carotid artery to suggest a hemodynamically significant stenosis. RIGHT VERTEBRAL ARTERY:  Antegrade flow LEFT CAROTID ARTERY: There is a minimal amount of intimal wall thickening within the left carotid bulb, extending to involve the origin and proximal aspects of the left internal carotid artery, morphologically similar to the 03/2014 examination and again not definitely resulting in elevated peak systolic velocities within left internal carotid artery to suggest a hemodynamically significant stenosis. Borderline elevated peak systolic velocity within mid aspect the left internal carotid artery is felt to be factitiously elevated due to sampling at a location of tortuosity. LEFT VERTEBRAL ARTERY:  Antegrade flow IMPRESSION: Minimal amount of bilateral intimal thickening and atherosclerotic plaque, right subjectively greater than left, similar to the 03/2014 examination and again not definitely resulting in a hemodynamically significant stenosis within either internal carotid artery. Electronically Signed   By: Sandi Mariscal M.D.   On: 02/19/2018 11:54   Dg Abd 2 Views  Result Date: 02/19/2018 CLINICAL DATA:  Fall 2 days ago. EXAM: ABDOMEN - 2 VIEW; ABDOMEN - 1 VIEW COMPARISON:  CT abdomen and pelvis 03/22/2014 FINDINGS: Supine, upright, and decubitus views of the abdomen are obtained. Diffusely stool and gas-filled colon without abnormal distention. No small bowel distention. No free intra-abdominal air. No radiopaque stones. Thoracolumbar scoliosis and degenerative changes. Vascular calcifications. IMPRESSION:  Nonobstructive bowel gas pattern with diffusely stool-filled colon. No free air. Electronically Signed   By: Lucienne Capers M.D.   On: 02/19/2018 04:34   Mr Jodene Nam Head/brain OE Cm  Result Date: 02/19/2018 CLINICAL DATA:  Altered mental status EXAM: MRI HEAD WITHOUT CONTRAST MRA HEAD WITHOUT CONTRAST TECHNIQUE: Multiplanar, multiecho pulse sequences of the brain and surrounding structures were obtained without intravenous contrast. Angiographic images of the head were obtained using MRA technique without contrast. COMPARISON:  Noncontrast head CT earlier today FINDINGS: MRI HEAD FINDINGS Significantly motion degraded diffusion, T1, and axial fluid sensitive sequences were acquired. There is a moderate area of restricted diffusion centered in the left parietal cortex. Smaller area of gliosis in the posterior left frontal lobe. No evidence of hemorrhage, hydrocephalus, collection, or mass. Mild atrophy and periventricular chronic small vessel ischemia. Xanthogranulomas of the choroid plexus. MRA HEAD FINDINGS Significantly  motion degraded. Strong left vertebral artery dominance, known from cervical MRI in 2006. The vertebral, basilar, and carotid arteries are patent. Grossly symmetric flow related signal and bilateral MCA, PCA and ACA branches. IMPRESSION: Brain MRI: 1. Moderate acute infarct in the left parietal cortex. 2. Small remote left posterior frontal infarct. 3. Mild for age atrophy and chronic small vessel disease. 4. Incomplete and severely motion degraded exam. Intracranial MRA: 1. Severely motion degraded. 2. No detectable proximal occlusion. Electronically Signed   By: Monte Fantasia M.D.   On: 02/19/2018 09:57   Ct Hematuria Workup  Result Date: 02/19/2018 CLINICAL DATA:  84 year old female with history of altered mental status and hematuria. EXAM: CT ABDOMEN AND PELVIS WITHOUT AND WITH CONTRAST TECHNIQUE: Multidetector CT imaging of the abdomen and pelvis was performed following the standard  protocol before and following the bolus administration of intravenous contrast. CONTRAST:  150mL ISOVUE-300 IOPAMIDOL (ISOVUE-300) INJECTION 61% COMPARISON:  CT of the abdomen and pelvis 03/22/2014. FINDINGS: Lower chest: Aortic atherosclerosis. Coronary artery calcifications involving left circumflex and right coronary arteries. Calcifications of the aortic valve. Hepatobiliary: There are multiple subcentimeter hepatic lesions. Several of these are very well-defined and low-attenuation, favored to represent tiny cysts or biliary hamartomas. Other lesions are more ill-defined (example is a 9 mm lesion in segment 6 (axial image 20 of series 3) which does not appear to represent a small cyst). All of these lesions are too small to definitively characterize. There is no intra or extrahepatic biliary ductal dilatation. Gallbladder is normal in appearance. Pancreas: No pancreatic mass. No pancreatic ductal dilatation. No pancreatic or peripancreatic fluid or inflammatory changes. Spleen: Unremarkable. Adrenals/Urinary Tract: No calcifications are noted within the collecting system of either kidney, along the course of either ureter, or within the lumen of the urinary bladder. No hydroureteronephrosis to suggest urinary tract obstruction at this time. There are numerous well-defined low-attenuation lesions in both kidneys, largest of which are compatible with simple cysts, measuring up to 6.6 x 8.3 cm extending exophytically from the lower pole of the left kidney. Other lesions are too small to definitively characterize in both kidneys, but are also favored to represent tiny cysts. Postcontrast delayed images demonstrate no definite filling defects within the collecting system of either kidney, along the course of either ureter, or within the lumen of the urinary bladder to strongly suggest the presence of urothelial neoplasm at this time. Small amount of gas non dependently in the lumen of the urinary bladder. Urinary  bladder is otherwise normal in appearance. Bilateral adrenal glands are normal in appearance. Stomach/Bowel: Normal appearance of the stomach. No pathologic dilatation of small bowel or colon. Numerous colonic diverticulae are noted, without surrounding inflammatory changes to suggest an acute diverticulitis at this time. Normal appendix. Vascular/Lymphatic: Aortic atherosclerosis, without evidence of aneurysm or dissection in the abdominal or pelvic vasculature. No lymphadenopathy noted in the abdomen or pelvis. Reproductive: Uterus and ovaries are atrophic. Other: No significant volume of ascites.  No pneumoperitoneum. Musculoskeletal: There are no aggressive appearing lytic or blastic lesions noted in the visualized portions of the skeleton. IMPRESSION: 1. No findings to account for the patient's history of hematuria. Specifically, no urinary tract calculi no findings of urinary tract obstruction are noted at this time. 2. Small amount of gas non dependently in the lumen of the urinary bladder. Typically, this is iatrogenic. However, if there has been no recent catheterization, further evaluation with urinalysis would be recommended to exclude the possibility of urinary tract infection with gas-forming organisms.  3. Multiple low-attenuation lesions in both kidneys. The largest of these are compatible with simple cysts, while the smaller lesions are too small to definitively characterize, but are also favored to represent tiny cysts. 4. Multiple subcentimeter hepatic lesions, too small to characterize on today's noncontrast CT examination. These appear similar in size, number and distribution to prior study from 2015 and are favored to be benign. 5. Aortic atherosclerosis, in addition to at least 2 vessel coronary artery disease. 6. Colonic diverticulosis without evidence of acute diverticulitis at this time. Aortic Atherosclerosis (ICD10-I70.0). Electronically Signed   By: Vinnie Langton M.D.   On: 02/19/2018  09:52      Subjective: Pt feels much better and asking to go home.  Pt has better speech today and strength has returned to baseline.    Discharge Exam: Vitals:   02/20/18 0636 02/20/18 0921  BP: (!) 147/77 (!) 148/65  Pulse: 68 74  Resp: 17 18  Temp: (!) 97.5 F (36.4 C) 98.2 F (36.8 C)  SpO2: 95% 94%   Vitals:   02/20/18 0039 02/20/18 0444 02/20/18 0636 02/20/18 0921  BP: (!) 156/58 (!) 144/63 (!) 147/77 (!) 148/65  Pulse: 69 (!) 57 68 74  Resp: 18 20 17 18   Temp: 98.1 F (36.7 C)  (!) 97.5 F (36.4 C) 98.2 F (36.8 C)  TempSrc: Oral  Oral Oral  SpO2: 97% 92% 95% 94%  Weight:      Height:       General: Pt is alert, awake, not in acute distress Cardiovascular: RRR, S1/S2 +, no rubs, no gallops Respiratory: CTA bilaterally, no wheezing, no rhonchi Abdominal: Soft, NT, ND, bowel sounds + Extremities: no edema, no cyanosis   The results of significant diagnostics from this hospitalization (including imaging, microbiology, ancillary and laboratory) are listed below for reference.     Microbiology: Recent Results (from the past 240 hour(s))  Blood Culture (routine x 2)     Status: None (Preliminary result)   Collection Time: 02/19/18  1:28 AM  Result Value Ref Range Status   Specimen Description RIGHT ANTECUBITAL  Final   Special Requests   Final    BOTTLES DRAWN AEROBIC AND ANAEROBIC Blood Culture adequate volume DRAWN BY RN   Culture   Final    NO GROWTH 1 DAY Performed at [facility], [address] [place], [place] [address]    Report Status PENDING  Incomplete  Blood Culture (routine x 2)     Status: None (Preliminary result)   Collection Time: 02/19/18  1:40 AM  Result Value Ref Range Status   Specimen Description BLOOD RIGHT FOREARM  Final   Special Requests   Final    BOTTLES DRAWN AEROBIC AND ANAEROBIC BOTTLES DRAWN AEROBIC AND ANAEROBIC DRAWN BY RN   Culture   Final    NO GROWTH 1 DAY Performed at [facility], [address] [place], [place]  [address]    Report Status PENDING  Incomplete     Labs: BNP (last 3 results) No results for input(s): BNP in the last 8760 hours. Basic Metabolic Panel: Recent Labs  Lab 02/19/18 0128 02/20/18 0534  NA 137 139  K 3.6 3.6  CL 102 108  CO2 26 25  GLUCOSE 101* 86  BUN 9 7  CREATININE 0.85 0.80  CALCIUM 9.0 8.6*   Liver Function Tests: Recent Labs  Lab 02/19/18 0128 02/20/18 0534  AST 21 16  ALT 10* 10*  ALKPHOS 47 40  BILITOT 0.7 0.8  PROT  6.8 5.8*  ALBUMIN 4.0 3.3*   No results for input(s): LIPASE, AMYLASE in the last 168 hours. No results for input(s): AMMONIA in the last 168 hours. CBC: Recent Labs  Lab 02/19/18 0128 02/20/18 0534  WBC 6.5 5.1  NEUTROABS 5.3 3.3  HGB 13.1 12.4  HCT 40.5 39.8  MCV 90.4 90.7  PLT 214 216   Cardiac Enzymes: No results for input(s): CKTOTAL, CKMB, CKMBINDEX, TROPONINI in the last 168 hours. BNP: Invalid input(s): POCBNP CBG: No results for input(s): GLUCAP in the last 168 hours. D-Dimer No results for input(s): DDIMER in the last 72 hours. Hgb A1c Recent Labs    02/19/18 0629  HGBA1C 5.2   Lipid Profile Recent Labs    02/19/18 0629  CHOL 119  HDL 43  LDLCALC 67  TRIG 45  CHOLHDL 2.8   Thyroid function studies No results for input(s): TSH, T4TOTAL, T3FREE, THYROIDAB in the last 72 hours.  Invalid input(s): FREET3 Anemia work up No results for input(s): VITAMINB12, FOLATE, FERRITIN, TIBC, IRON, RETICCTPCT in the last 72 hours. Urinalysis    Component Value Date/Time   COLORURINE YELLOW 02/19/2018 0243   APPEARANCEUR HAZY (A) 02/19/2018 0243   LABSPEC 1.012 02/19/2018 0243   PHURINE 8.0 02/19/2018 0243   GLUCOSEU NEGATIVE 02/19/2018 0243   HGBUR MODERATE (A) 02/19/2018 0243   BILIRUBINUR NEGATIVE 02/19/2018 0243   KETONESUR 5 (A) 02/19/2018 0243   PROTEINUR NEGATIVE 02/19/2018 0243   NITRITE NEGATIVE 02/19/2018 0243   LEUKOCYTESUR NEGATIVE 02/19/2018 0243   Sepsis Labs Invalid input(s):  PROCALCITONIN,  WBC,  LACTICIDVEN Microbiology Recent Results (from the past 240 hour(s))  Blood Culture (routine x 2)     Status: None (Preliminary result)   Collection Time: 02/19/18  1:28 AM  Result Value Ref Range Status   Specimen Description RIGHT ANTECUBITAL  Final   Special Requests   Final    BOTTLES DRAWN AEROBIC AND ANAEROBIC Blood Culture adequate volume DRAWN BY RN   Culture   Final    NO GROWTH 1 DAY Performed at [facility], [address], [place], [place] [address]    Report Status PENDING  Incomplete  Blood Culture (routine x 2)     Status: None (Preliminary result)   Collection Time: 02/19/18  1:40 AM  Result Value Ref Range Status   Specimen Description BLOOD RIGHT FOREARM  Final   Special Requests   Final    BOTTLES DRAWN AEROBIC AND ANAEROBIC BOTTLES DRAWN AEROBIC AND ANAEROBIC DRAWN BY RN   Culture   Final    NO GROWTH 1 DAY Performed at [facility], [address] [place], [place] [address]    Report Status PENDING  Incomplete    Time coordinating discharge: 34 minutes   SIGNED:  Irwin Brakeman, MD  [facility] 02/20/2018, 12:42 PM Pager [phone]  If 7PM-7AM, please contact night-coverage [email or web address] Password [email or web address]

## 2018-02-20 NOTE — Discharge Instructions (Signed)
Follow with Primary MD  Asencion Noble, MD  and other consultants as instructed your Hospitalist MD  Please get a complete blood count and chemistry panel checked by your Primary MD at your next visit, and again as instructed by your Primary MD.  Get Medicines reviewed and adjusted: Please take all your medications with you for your next visit with your Primary MD  Laboratory/radiological data: Please request your Primary MD to go over all hospital tests and procedure/radiological results at the follow up, please ask your Primary MD to get all Hospital records sent to his/her office.  In some cases, they will be blood work, cultures and biopsy results pending at the time of your discharge. Please request that your primary care M.D. follows up on these results.  Also Note the following: If you experience worsening of your admission symptoms, develop shortness of breath, life threatening emergency, suicidal or homicidal thoughts you must seek medical attention immediately by calling 911 or calling your MD immediately  if symptoms less severe.  You must read complete instructions/literature along with all the possible adverse reactions/side effects for all the Medicines you take and that have been prescribed to you. Take any new Medicines after you have completely understood and accpet all the possible adverse reactions/side effects.   Do not drive when taking Pain medications or sleeping medications (Benzodaizepines)  Do not take more than prescribed Pain, Sleep and Anxiety Medications. It is not advisable to combine anxiety,sleep and pain medications without talking with your primary care practitioner  Special Instructions: If you have smoked or chewed Tobacco  in the last 2 yrs please stop smoking, stop any regular Alcohol  and or any Recreational drug use.  Wear Seat belts while driving.  Please note: You were cared for by a hospitalist during your hospital stay. Once you are discharged, your  primary care physician will handle any further medical issues. Please note that NO REFILLS for any discharge medications will be authorized once you are discharged, as it is imperative that you return to your primary care physician (or establish a relationship with a primary care physician if you do not have one) for your post hospital discharge needs so that they can reassess your need for medications and monitor your lab values.     Fall Prevention in the Home Falls can cause injuries. They can happen to people of all ages. There are many things you can do to make your home safe and to help prevent falls. What can I do on the outside of my home?  Regularly fix the edges of walkways and driveways and fix any cracks.  Remove anything that might make you trip as you walk through a door, such as a raised step or threshold.  Trim any bushes or trees on the path to your home.  Use bright outdoor lighting.  Clear any walking paths of anything that might make someone trip, such as rocks or tools.  Regularly check to see if handrails are loose or broken. Make sure that both sides of any steps have handrails.  Any raised decks and porches should have guardrails on the edges.  Have any leaves, snow, or ice cleared regularly.  Use sand or salt on walking paths during winter.  Clean up any spills in your garage right away. This includes oil or grease spills. What can I do in the bathroom?  Use night lights.  Install grab bars by the toilet and in the tub and shower. Do not use  towel bars as grab bars.  Use non-skid mats or decals in the tub or shower.  If you need to sit down in the shower, use a plastic, non-slip stool.  Keep the floor dry. Clean up any water that spills on the floor as soon as it happens.  Remove soap buildup in the tub or shower regularly.  Attach bath mats securely with double-sided non-slip rug tape.  Do not have throw rugs and other things on the floor that can  make you trip. What can I do in the bedroom?  Use night lights.  Make sure that you have a light by your bed that is easy to reach.  Do not use any sheets or blankets that are too big for your bed. They should not hang down onto the floor.  Have a firm chair that has side arms. You can use this for support while you get dressed.  Do not have throw rugs and other things on the floor that can make you trip. What can I do in the kitchen?  Clean up any spills right away.  Avoid walking on wet floors.  Keep items that you use a lot in easy-to-reach places.  If you need to reach something above you, use a strong step stool that has a grab bar.  Keep electrical cords out of the way.  Do not use floor polish or wax that makes floors slippery. If you must use wax, use non-skid floor wax.  Do not have throw rugs and other things on the floor that can make you trip. What can I do with my stairs?  Do not leave any items on the stairs.  Make sure that there are handrails on both sides of the stairs and use them. Fix handrails that are broken or loose. Make sure that handrails are as long as the stairways.  Check any carpeting to make sure that it is firmly attached to the stairs. Fix any carpet that is loose or worn.  Avoid having throw rugs at the top or bottom of the stairs. If you do have throw rugs, attach them to the floor with carpet tape.  Make sure that you have a light switch at the top of the stairs and the bottom of the stairs. If you do not have them, ask someone to add them for you. What else can I do to help prevent falls?  Wear shoes that: ? Do not have high heels. ? Have rubber bottoms. ? Are comfortable and fit you well. ? Are closed at the toe. Do not wear sandals.  If you use a stepladder: ? Make sure that it is fully opened. Do not climb a closed stepladder. ? Make sure that both sides of the stepladder are locked into place. ? Ask someone to hold it for you,  if possible.  Clearly mark and make sure that you can see: ? Any grab bars or handrails. ? First and last steps. ? Where the edge of each step is.  Use tools that help you move around (mobility aids) if they are needed. These include: ? Canes. ? Walkers. ? Scooters. ? Crutches.  Turn on the lights when you go into a dark area. Replace any light bulbs as soon as they burn out.  Set up your furniture so you have a clear path. Avoid moving your furniture around.  If any of your floors are uneven, fix them.  If there are any pets around you, be aware of where  they are.  Review your medicines with your doctor. Some medicines can make you feel dizzy. This can increase your chance of falling. Ask your doctor what other things that you can do to help prevent falls. This information is not intended to replace advice given to you by your health care provider. Make sure you discuss any questions you have with your health care provider. Document Released: 06/18/2009 Document Revised: 01/28/2016 Document Reviewed: 09/26/2014 Elsevier Interactive Patient Education  [employer].

## 2018-02-20 NOTE — Progress Notes (Signed)
Removed both IVs-clean, dry, intact. Reviewed d/c paperwork with patient, daughter, and husband. Reviewed new/changes to medication. Answered all questions. Wheeled stable patient and belongings to main entrance where she was picked up by husband.

## 2018-02-20 NOTE — Care Management Note (Signed)
Case Management Note  Patient Details  Name: SUHA SCHOENBECK MRN: [id number] Date of Birth: July 14, 1931  Subjective/Objective:     Admitted with CVA. Pt from home with husband. Has cane, walker, WC, BSC.  Family refusing SNF.               Action/Plan: DC home with referral for [facility] services. CM spoke with daughter via phone. They have requested [facility] be [facility] referred to, aware HH has 48 hrs to make first visit. Juliann Pulse, [facility] rep, aware of referral.   Expected Discharge Date:  02/20/18               Expected Discharge Plan:  [place]  In-House Referral:  Clinical Social Work  Discharge planning Services  CM Consult  Post Acute Care Choice:  Home Health Choice offered to:  Adult Children  HH Arranged:  RN, PT, OT, Speech Therapy HH Agency:  [place]  Status of Service:  Completed, signed off  If discussed at [place] of Stay Meetings, dates discussed:    Additional Comments:  Sherald Barge, RN 02/20/2018, 12:44 PM

## 2018-02-20 NOTE — Evaluation (Signed)
Occupational Therapy Evaluation Patient Details Name: Amy Sawyer MRN: [id number] DOB: [birth year] Today's Date: 02/20/2018    History of Present Illness Amy Sawyer is a 84 y.o. female with medical history significant of hypertension, hyperlipidemia, previous CVA who is coming to the emergency department due to fever and altered mental status with her daughters reporting that around 30 on Sunday the patient was talking loud, disoriented, having difficulty finding what to say and slurred speech.  Some of her symptoms were similar to what she had previously, when she presented with CVA.  She had an accidental fall yesterday causing a left arm skin tear.  The daughters are not certain if she had any type of head trauma during the fall, as this was not witnessed, but she was reported acting normal after the fall.  She is unable to provide further history at this time as she is still confused.   Clinical Impression   Pt agreeable to OT evaluation, completed at bed level as pt was waiting on breakfast and preferred to eat in bed. Pt demonstrating BUE strength WFL, OT noting coordination deficits when attempting to prepare and eat breakfast using RUE as dominant. Pt did improve with motor control throughout meal. Pt will benefit from continued acute OT services to further assess ADL completion. Recommend HHOT services on discharge to assess pt functioning in the home environment and treat accordingly.     Follow Up Recommendations  Home health OT;Supervision/Assistance - 24 hour    Equipment Recommendations  None recommended by OT       Precautions / Restrictions Precautions Precautions: Fall Restrictions Weight Bearing Restrictions: No      Mobility Bed Mobility               General bed mobility comments: Not tested  Transfers                 General transfer comment: Not tested         ADL either performed or assessed with clinical judgement   ADL Overall  ADL's : Needs assistance/impaired Eating/Feeding: Modified independent Eating/Feeding Details (indicate cue type and reason): Pt requiring increased time for feeding tasks including opening packages, cutting food, and eating. Pt able to hold fork however has difficulty with maintaining hold when cutting eggs and waffle, also with difficulty stabbing food to keep on fork. Pt coordination improving the more she practiced throughout meal                                         Vision Baseline Vision/History: Wears glasses Wears Glasses: Reading only Patient Visual Report: No change from baseline Vision Assessment?: No apparent visual deficits            Pertinent Vitals/Pain Pain Assessment: No/denies pain     Hand Dominance Right   Extremity/Trunk Assessment Upper Extremity Assessment Upper Extremity Assessment: Overall WFL for tasks assessed;RUE deficits/detail(grossly 4+/5) RUE Deficits / Details: strength grossly 4+/5, decreased grip strength RUE Sensation: WNL RUE Coordination: decreased fine motor   Lower Extremity Assessment Lower Extremity Assessment: Defer to PT evaluation   Cervical / Trunk Assessment Cervical / Trunk Assessment: Normal   Communication Communication Communication: HOH(wearing hearing aids)   Cognition Arousal/Alertness: Awake/alert Behavior During Therapy: WFL for tasks assessed/performed Overall Cognitive Status: Within Functional Limits for tasks assessed  Home Living Family/patient expects to be discharged to:: Private residence Living Arrangements: Spouse/significant other;Children(daughter and grandson) Available Help at Discharge: Family;Available 24 hours/day Type of Home: House Home Access: Ramped entrance     Home Layout: Two level Alternate Level Stairs-Number of Steps: 15   Bathroom Shower/Tub: Teacher, early years/pre: Standard      Home Equipment: Cane - single point;Walker - 2 wheels;Bedside commode;Wheelchair - manual      Lives With: Spouse;Daughter(grandson)    Prior Functioning/Environment Level of Independence: Independent with assistive device(s)        Comments: household, short distanced community ambulator with [facility] PR[facility]        OT Problem List: Decreased strength;Decreased activity tolerance;Impaired balance (sitting and/or standing);Decreased coordination;Decreased knowledge of use of DME or AE;Impaired UE functional use      OT Treatment/Interventions: Self-care/ADL training;Therapeutic exercise;Neuromuscular education;DME and/or AE instruction;Therapeutic activities;Patient/family education    OT Goals(Current goals can be found in the care plan section) Acute Rehab OT Goals Patient Stated Goal: to improve use of my right hand OT Goal Formulation: With patient/family Time For Goal Achievement: 03/06/18 Potential to Achieve Goals: Good  OT Frequency: Min 2X/week    End of Session    Activity Tolerance: Patient tolerated treatment well Patient left: in bed;with call bell/phone within reach;with family/visitor present  OT Visit Diagnosis: Muscle weakness (generalized) (M62.81)                Time: 0141-0301 OT Time Calculation (min): 24 min Charges:  OT General Charges $OT Visit: 1 Visit OT Evaluation $OT Eval Low Complexity: [place], OTR/L  [phone] 02/20/2018, 9:07 AM

## 2018-02-20 NOTE — Progress Notes (Signed)
EEG completed; results pending.    

## 2018-02-20 NOTE — Progress Notes (Signed)
PROGRESS NOTE    LEILANNI HALVORSON  HYQ:[id number]  DOB: [birth year]  DOA: 02/19/2018 PCP: Asencion Noble, MD   Brief Admission Hx: CARMELIA TINER is a 84 y.o. female with medical history significant of hypertension, hyperlipidemia, previous CVA who is coming to the emergency department due to fever and altered mental status with her daughters reporting that around 12 on Sunday the patient was talking loud, disoriented, having difficulty finding what to say and slurred speech.  Some of her symptoms were similar to what she had previously, when she presented with CVA. She had an accidental fall yesterday causing a left arm skin tear.  The daughters are not certain if she had any type of head trauma during the fall, as this was not witnessed, but she was reported acting normal after the fall.  She is unable to provide further history at this time as she is still confused.  In the ED her vital signs were: temp 99.1 F, HR 82, resp 26, BP 157/97, and SpO2 100% on [place] O2 2 L/min. Her UA showed a hazy appearance, moderate hemoglobinuria, ketonuria, and more than 50 RBC on microscopic exam. CBC showed white count of 6.5 with 80% neutrophils, 12% lymphocytes and 5% monocytes. Her Hgb was 13.1 and platelets 214. Lactic acid was normal x 2. CMP showed glucose of 101 and ALT decrease at 10 unit/L, all other values were normal. Chest xrays  Showed cardiomegaly, but no acute cardiopulmonary disease. Abdominal xrays showed diffuse stool-filled colon. CT head without contrast showed a focal hypodensity at the left parietal long, suspicious for acute subacute infarct. Further evaluation recommended with MRI.  MDM/Assessment & Plan:   Principal problem: Altered mental status  Active problems: Fever Hyperlipidemia Hypertension  1. Altered mental status - CT finding suggest TIA/CVA. Further workup with MRI shows moderate acute infarct in the left parietal cortex, small remote left posterior frontal infarct. There  appears to be no head trauma due to fall on scans. Suspect fall caused by stroke. Patient's mental status has returned to baseline. Her swallow study recommends pureed foods and thin consistencies. They also recommend SNF for rehab. PT followed up with patient and recommends SNF to assist with rehab for ADLs, gait, balance, strength, and endurance. Family has requested discharge to home with home health rehab nurse. Will plan with care team. 2. Fever -  No obvious source of fever in ED. Fluids were continued from the ED. Subsequent temperatures have been 98 degrees and 97.5. Fever has resolved. 3. Hyperlipidemia - Continue home medications. 4. Hypertension - Continue home medications.    DVT prophylaxis: Lovenox Code Status: FULL CODE Family Communication: Husband and daughter at bedside. They have concerns about sending patient to rehab and want to do home rehab.  Disposition Plan: Observation/telemetry; discharge home tomorrow pending home health for rehab referral.    Consultants:  PT; ADLS  Cardiovascular - Echocardiogram: Doppler studies consistent with grade 1 diastolic dysfunction.  Neurology; pending  Procedures:  Echocardiogram    Subjective: Patient states that she is feeling much better. Family at bedside states she is much more at her baseline.  Objective: Vitals:   02/20/18 0032 02/20/18 0039 02/20/18 0444 02/20/18 0636  BP: (!) 154/71 (!) 156/58 (!) 144/63 (!) 147/77  Pulse: 71 69 (!) 57 68  Resp: 20 18 20 17   Temp: 98.7 F (37.1 C) 98.1 F (36.7 C)  (!) 97.5 F (36.4 C)  TempSrc:  Oral  Oral  SpO2: 93% 97% 92% 95%  Weight:  Height:        Intake/Output Summary (Last 24 hours) at 02/20/2018 0843 Last data filed at 02/20/2018 0300 Gross per 24 hour  Intake 1381.87 ml  Output 400 ml  Net 981.87 ml   Filed Weights   02/19/18 0438  Weight: 56.1 kg (123 lb 10.9 oz)     REVIEW OF SYSTEMS  As per history otherwise all reviewed and reported  negative  Exam:  General exam: Calm, comfortable, NAD.  Respiratory system: Clear. No increased work of breathing. Cardiovascular system: S1 & S2 heard, RRR. No JVD, murmurs, gallops, clicks or pedal edema. Gastrointestinal system: Abdomen is nondistended, soft and nontender. Normal bowel sounds heard. Skin: Left forearm wound/skin tear, healing. No purulent discharge. Central nervous system: Alert and oriented. No focal neurological deficits. Extremities: no CCE.  Data Reviewed: Basic Metabolic Panel: Recent Labs  Lab 02/19/18 0128 02/20/18 0534  NA 137 139  K 3.6 3.6  CL 102 108  CO2 26 25  GLUCOSE 101* 86  BUN 9 7  CREATININE 0.85 0.80  CALCIUM 9.0 8.6*   Liver Function Tests: Recent Labs  Lab 02/19/18 0128 02/20/18 0534  AST 21 16  ALT 10* 10*  ALKPHOS 47 40  BILITOT 0.7 0.8  PROT 6.8 5.8*  ALBUMIN 4.0 3.3*   No results for input(s): LIPASE, AMYLASE in the last 168 hours. No results for input(s): AMMONIA in the last 168 hours. CBC: Recent Labs  Lab 02/19/18 0128 02/20/18 0534  WBC 6.5 5.1  NEUTROABS 5.3 3.3  HGB 13.1 12.4  HCT 40.5 39.8  MCV 90.4 90.7  PLT 214 216   Cardiac Enzymes: No results for input(s): CKTOTAL, CKMB, CKMBINDEX, TROPONINI in the last 168 hours. CBG (last 3)  No results for input(s): GLUCAP in the last 72 hours. Recent Results (from the past 240 hour(s))  Blood Culture (routine x 2)     Status: None (Preliminary result)   Collection Time: 02/19/18  1:28 AM  Result Value Ref Range Status   Specimen Description RIGHT ANTECUBITAL  Final   Special Requests   Final    BOTTLES DRAWN AEROBIC AND ANAEROBIC Blood Culture adequate volume DRAWN BY RN   Culture   Final    NO GROWTH 1 DAY Performed at [facility], [address], [place], [place] [address]    Report Status PENDING  Incomplete  Blood Culture (routine x 2)     Status: None (Preliminary result)   Collection Time: 02/19/18  1:40 AM  Result Value Ref Range Status    Specimen Description BLOOD RIGHT FOREARM  Final   Special Requests   Final    BOTTLES DRAWN AEROBIC AND ANAEROBIC BOTTLES DRAWN AEROBIC AND ANAEROBIC DRAWN BY RN   Culture   Final    NO GROWTH 1 DAY Performed at [facility], [address], [place], [place] [address]    Report Status PENDING  Incomplete     Studies: Dg Chest 2 View  Result Date: 02/19/2018 CLINICAL DATA:  Altered mental status EXAM: CHEST - 2 VIEW COMPARISON:  10/31/2017 FINDINGS: Mildly low lung volumes. Cardiomegaly with aortic atherosclerosis. No focal airspace disease or pleural effusion. No pneumothorax. Lucency beneath the left diaphragm. IMPRESSION: 1. Cardiomegaly with low lung volumes but no acute pulmonary infiltrate 2. Lucency beneath the left diaphragm, questionable for air distended bowel; consider CT evaluation to exclude pneumoperitoneum. Electronically Signed   By: Donavan Foil M.D.   On: 02/19/2018 02:27   Dg Forearm Left  Result Date: 02/19/2018  CLINICAL DATA:  Trauma, altered mental status EXAM: LEFT FOREARM - 2 VIEW COMPARISON:  None. FINDINGS: No fracture or malalignment.  No significant elbow effusion IMPRESSION: No acute osseous abnormality Electronically Signed   By: Donavan Foil M.D.   On: 02/19/2018 02:28   Dg Abdomen 1 View  Result Date: 02/19/2018 CLINICAL DATA:  Fall 2 days ago. EXAM: ABDOMEN - 2 VIEW; ABDOMEN - 1 VIEW COMPARISON:  CT abdomen and pelvis 03/22/2014 FINDINGS: Supine, upright, and decubitus views of the abdomen are obtained. Diffusely stool and gas-filled colon without abnormal distention. No small bowel distention. No free intra-abdominal air. No radiopaque stones. Thoracolumbar scoliosis and degenerative changes. Vascular calcifications. IMPRESSION: Nonobstructive bowel gas pattern with diffusely stool-filled colon. No free air. Electronically Signed   By: Lucienne Capers M.D.   On: 02/19/2018 04:34   Ct Head Wo Contrast  Result Date: 02/19/2018 CLINICAL DATA:  Altered LOC  EXAM: CT HEAD WITHOUT CONTRAST TECHNIQUE: Contiguous axial images were obtained from the base of the skull through the vertex without intravenous contrast. COMPARISON:  CT brain 03/22/2014 FINDINGS: Brain: No hemorrhage or intracranial mass is visualized. Moderate-to-marked atrophy. Moderate small vessel ischemic changes of the white matter. Focal hypodensity within the left parietal lobe. Stable ventricle size. Vascular: No hyperdense vessels. Vertebral and carotid vascular calcification. Skull: No fracture Sinuses/Orbits: No acute finding. Other: None IMPRESSION: 1. Focal cortical hypodensity at the left parietal lobe, suspicious for acute to subacute infarct. Further evaluation with MRI is suggested. No hemorrhage. 2. Atrophy with moderate small vessel ischemic changes of the white matter. Electronically Signed   By: Donavan Foil M.D.   On: 02/19/2018 02:34   Mr Brain Wo Contrast  Result Date: 02/19/2018 CLINICAL DATA:  Altered mental status EXAM: MRI HEAD WITHOUT CONTRAST MRA HEAD WITHOUT CONTRAST TECHNIQUE: Multiplanar, multiecho pulse sequences of the brain and surrounding structures were obtained without intravenous contrast. Angiographic images of the head were obtained using MRA technique without contrast. COMPARISON:  Noncontrast head CT earlier today FINDINGS: MRI HEAD FINDINGS Significantly motion degraded diffusion, T1, and axial fluid sensitive sequences were acquired. There is a moderate area of restricted diffusion centered in the left parietal cortex. Smaller area of gliosis in the posterior left frontal lobe. No evidence of hemorrhage, hydrocephalus, collection, or mass. Mild atrophy and periventricular chronic small vessel ischemia. Xanthogranulomas of the choroid plexus. MRA HEAD FINDINGS Significantly motion degraded. Strong left vertebral artery dominance, known from cervical MRI in 2006. The vertebral, basilar, and carotid arteries are patent. Grossly symmetric flow related signal and  bilateral MCA, PCA and ACA branches. IMPRESSION: Brain MRI: 1. Moderate acute infarct in the left parietal cortex. 2. Small remote left posterior frontal infarct. 3. Mild for age atrophy and chronic small vessel disease. 4. Incomplete and severely motion degraded exam. Intracranial MRA: 1. Severely motion degraded. 2. No detectable proximal occlusion. Electronically Signed   By: Monte Fantasia M.D.   On: 02/19/2018 09:57   US Carotid Bilateral (at [facility] And [facility] Only)  Result Date: 02/19/2018 CLINICAL DATA:  History of stroke, hypertension and hyperlipidemia. EXAM: BILATERAL CAROTID DUPLEX ULTRASOUND TECHNIQUE: Pearline Cables scale imaging, color Doppler and duplex ultrasound were performed of bilateral carotid and vertebral arteries in the neck. COMPARISON:  03/23/2014 FINDINGS: Examination is degraded secondary to patient's inability to tolerate the procedure. Criteria: Quantification of carotid stenosis is based on velocity parameters that correlate the residual internal carotid diameter with NASCET-based stenosis levels, using the diameter of the distal internal carotid lumen as the denominator for  stenosis measurement. The following velocity measurements were obtained: RIGHT ICA:  122/29 cm/sec CCA:  10/17 cm/sec SYSTOLIC ICA/CCA RATIO:  1.5 ECA:  79 cm/sec LEFT ICA:  72/15 cm/sec CCA:  51/02 cm/sec SYSTOLIC ICA/CCA RATIO:  0.8 ECA:  51 cm/sec RIGHT CAROTID ARTERY: There is a minimal amount of eccentric mixed echogenic plaque within the right carotid bulb (image 16), morphologically similar to the 03/2014 examination and again not resulting in elevated peak systolic velocities within the interrogated course the right internal carotid artery to suggest a hemodynamically significant stenosis. RIGHT VERTEBRAL ARTERY:  Antegrade flow LEFT CAROTID ARTERY: There is a minimal amount of intimal wall thickening within the left carotid bulb, extending to involve the origin and proximal aspects of the left internal carotid  artery, morphologically similar to the 03/2014 examination and again not definitely resulting in elevated peak systolic velocities within left internal carotid artery to suggest a hemodynamically significant stenosis. Borderline elevated peak systolic velocity within mid aspect the left internal carotid artery is felt to be factitiously elevated due to sampling at a location of tortuosity. LEFT VERTEBRAL ARTERY:  Antegrade flow IMPRESSION: Minimal amount of bilateral intimal thickening and atherosclerotic plaque, right subjectively greater than left, similar to the 03/2014 examination and again not definitely resulting in a hemodynamically significant stenosis within either internal carotid artery. Electronically Signed   By: Sandi Mariscal M.D.   On: 02/19/2018 11:54   Dg Abd 2 Views  Result Date: 02/19/2018 CLINICAL DATA:  Fall 2 days ago. EXAM: ABDOMEN - 2 VIEW; ABDOMEN - 1 VIEW COMPARISON:  CT abdomen and pelvis 03/22/2014 FINDINGS: Supine, upright, and decubitus views of the abdomen are obtained. Diffusely stool and gas-filled colon without abnormal distention. No small bowel distention. No free intra-abdominal air. No radiopaque stones. Thoracolumbar scoliosis and degenerative changes. Vascular calcifications. IMPRESSION: Nonobstructive bowel gas pattern with diffusely stool-filled colon. No free air. Electronically Signed   By: Lucienne Capers M.D.   On: 02/19/2018 04:34   Mr Jodene Nam Head/brain HE Cm  Result Date: 02/19/2018 CLINICAL DATA:  Altered mental status EXAM: MRI HEAD WITHOUT CONTRAST MRA HEAD WITHOUT CONTRAST TECHNIQUE: Multiplanar, multiecho pulse sequences of the brain and surrounding structures were obtained without intravenous contrast. Angiographic images of the head were obtained using MRA technique without contrast. COMPARISON:  Noncontrast head CT earlier today FINDINGS: MRI HEAD FINDINGS Significantly motion degraded diffusion, T1, and axial fluid sensitive sequences were acquired. There  is a moderate area of restricted diffusion centered in the left parietal cortex. Smaller area of gliosis in the posterior left frontal lobe. No evidence of hemorrhage, hydrocephalus, collection, or mass. Mild atrophy and periventricular chronic small vessel ischemia. Xanthogranulomas of the choroid plexus. MRA HEAD FINDINGS Significantly motion degraded. Strong left vertebral artery dominance, known from cervical MRI in 2006. The vertebral, basilar, and carotid arteries are patent. Grossly symmetric flow related signal and bilateral MCA, PCA and ACA branches. IMPRESSION: Brain MRI: 1. Moderate acute infarct in the left parietal cortex. 2. Small remote left posterior frontal infarct. 3. Mild for age atrophy and chronic small vessel disease. 4. Incomplete and severely motion degraded exam. Intracranial MRA: 1. Severely motion degraded. 2. No detectable proximal occlusion. Electronically Signed   By: Monte Fantasia M.D.   On: 02/19/2018 09:57   Ct Hematuria Workup  Result Date: 02/19/2018 CLINICAL DATA:  84 year old female with history of altered mental status and hematuria. EXAM: CT ABDOMEN AND PELVIS WITHOUT AND WITH CONTRAST TECHNIQUE: Multidetector CT imaging of the abdomen and pelvis was performed  following the standard protocol before and following the bolus administration of intravenous contrast. CONTRAST:  163mL ISOVUE-300 IOPAMIDOL (ISOVUE-300) INJECTION 61% COMPARISON:  CT of the abdomen and pelvis 03/22/2014. FINDINGS: Lower chest: Aortic atherosclerosis. Coronary artery calcifications involving left circumflex and right coronary arteries. Calcifications of the aortic valve. Hepatobiliary: There are multiple subcentimeter hepatic lesions. Several of these are very well-defined and low-attenuation, favored to represent tiny cysts or biliary hamartomas. Other lesions are more ill-defined (example is a 9 mm lesion in segment 6 (axial image 20 of series 3) which does not appear to represent a small cyst).  All of these lesions are too small to definitively characterize. There is no intra or extrahepatic biliary ductal dilatation. Gallbladder is normal in appearance. Pancreas: No pancreatic mass. No pancreatic ductal dilatation. No pancreatic or peripancreatic fluid or inflammatory changes. Spleen: Unremarkable. Adrenals/Urinary Tract: No calcifications are noted within the collecting system of either kidney, along the course of either ureter, or within the lumen of the urinary bladder. No hydroureteronephrosis to suggest urinary tract obstruction at this time. There are numerous well-defined low-attenuation lesions in both kidneys, largest of which are compatible with simple cysts, measuring up to 6.6 x 8.3 cm extending exophytically from the lower pole of the left kidney. Other lesions are too small to definitively characterize in both kidneys, but are also favored to represent tiny cysts. Postcontrast delayed images demonstrate no definite filling defects within the collecting system of either kidney, along the course of either ureter, or within the lumen of the urinary bladder to strongly suggest the presence of urothelial neoplasm at this time. Small amount of gas non dependently in the lumen of the urinary bladder. Urinary bladder is otherwise normal in appearance. Bilateral adrenal glands are normal in appearance. Stomach/Bowel: Normal appearance of the stomach. No pathologic dilatation of small bowel or colon. Numerous colonic diverticulae are noted, without surrounding inflammatory changes to suggest an acute diverticulitis at this time. Normal appendix. Vascular/Lymphatic: Aortic atherosclerosis, without evidence of aneurysm or dissection in the abdominal or pelvic vasculature. No lymphadenopathy noted in the abdomen or pelvis. Reproductive: Uterus and ovaries are atrophic. Other: No significant volume of ascites.  No pneumoperitoneum. Musculoskeletal: There are no aggressive appearing lytic or blastic  lesions noted in the visualized portions of the skeleton. IMPRESSION: 1. No findings to account for the patient's history of hematuria. Specifically, no urinary tract calculi no findings of urinary tract obstruction are noted at this time. 2. Small amount of gas non dependently in the lumen of the urinary bladder. Typically, this is iatrogenic. However, if there has been no recent catheterization, further evaluation with urinalysis would be recommended to exclude the possibility of urinary tract infection with gas-forming organisms. 3. Multiple low-attenuation lesions in both kidneys. The largest of these are compatible with simple cysts, while the smaller lesions are too small to definitively characterize, but are also favored to represent tiny cysts. 4. Multiple subcentimeter hepatic lesions, too small to characterize on today's noncontrast CT examination. These appear similar in size, number and distribution to prior study from 2015 and are favored to be benign. 5. Aortic atherosclerosis, in addition to at least 2 vessel coronary artery disease. 6. Colonic diverticulosis without evidence of acute diverticulitis at this time. Aortic Atherosclerosis (ICD10-I70.0). Electronically Signed   By: Vinnie Langton M.D.   On: 02/19/2018 09:52     Scheduled Meds: .  stroke: mapping our early stages of recovery book   Does not apply Once  . aspirin EC  162  mg Oral Daily  . enoxaparin (LOVENOX) injection  40 mg Subcutaneous Q24H   Continuous Infusions:  Principal Problem:   Altered mental status Active Problems:   Hyperlipemia   Hypertension   Fever   Acute CVA (cerebrovascular accident) ([place])   Protein-calorie malnutrition, severe   Time spent:   Ashok Norris, Student AGACNP  Attending:  Irwin Brakeman, MD, FAAFP [facility] Pager [phone]  If 7PM-7AM, please contact night-coverage [email or web address] Password [email or web address] 02/20/2018, 8:43 AM    LOS: 1 day

## 2018-02-20 NOTE — Plan of Care (Signed)
Progressing

## 2018-02-20 NOTE — Consult Note (Addendum)
Fowler A. Merlene Laughter, MD     [email or web address]          Amy Sawyer is an 84 y.o. female.   ASSESSMENT/PLAN: 1. ACUTE ON MENTAL STATUS WITH RI FINDINGS CONSISTENT WITH ACUTE CORTICAL INFARCT: THE MRI FINDINGS IS ALMOST ENTIRE LIMITED TO THE CORTICAL RIM WHICH COULD ALSO BE DUE TO EPILEPTIC SEIZURES.  Full dose aspirin 325 is recommended.  An EEG is also recommended.  If this indeed is an infarction, it will be cryptogenic in nature and therefore a 30 event monitor is also  recommended.  Follow-up in the office with [place] in 6 weeks.  The patient should also continue with additional risk factor modification including blood pressure control and control of dyslipidemia.             Patient 84 year old white female who presents with the acute onset of diaphoresis or speaking and nonsensical speech.  Seems to have been relatively acutely.  The husband reports that she had the stroke in 1997 with similar presentation. It appears that she did not have any chest pain, shortness of breath or focal numbness or weakness.  The history is somewhat limited as she is severely hearing impaired.  She is reporting that she her speech has improved.  No clear loss of consciousness.  The review of systems is otherwise negative.  GENERAL:    This is a pleasant thin female in no acute distress.  HEENT:   There is marked hearing impairment.  ABDOMEN: soft  EXTREMITIES: No edema   BACK:   Normal  SKIN: Normal by inspection.    MENTAL STATUS: Alert and oriented -  Including orientation to age and the month. Speech, language ( comprehension seems good although somewhat limited due to hearing impairment, she has good fluency and naming was good with 5/5 obtained on bedside testing ) and cognition are generally intact. Judgment and insight normal.   CRANIAL NERVES: Pupils are equal, round and reactive to light and accomodation; extra ocular movements are full, there is no significant  nystagmus; visual fields are full; upper and lower facial muscles are normal in strength and symmetric, there is no flattening of the nasolabial folds; tongue is midline; uvula is midline; shoulder elevation is normal.  MOTOR: Normal tone, bulk and strength; no pronator drift.  No drift of the upper lower extremities  COORDINATION: Left finger to nose is normal, right finger to nose is normal, No rest tremor; no intention tremor; no postural tremor; no bradykinesia.  REFLEXES: Deep tendon reflexes are symmetrical and normal.   SENSATION: Normal to light touch, temperature, and pain.  No clear evidence of neglect.      NIH stroke scale 0.  Blood pressure (!) 147/77, pulse 68, temperature (!) 97.5 F (36.4 C), temperature source Oral, resp. rate 17, height 5' (1.524 m), weight 123 lb 10.9 oz (56.1 kg), SpO2 95 %.  Past Medical History:  Diagnosis Date  . HTN (hypertension)   . Hyperlipemia   . Stroke [facility])     Past Surgical History:  Procedure Laterality Date  . BREAST SURGERY    . LEG SURGERY      Family History  Problem Relation Age of Onset  . Heart disease Unknown   . Diabetes Unknown   . Stroke Mother     Social History:  reports that she has never smoked. She has never used smokeless tobacco. She reports that she does not drink alcohol or use drugs.  Allergies:  Allergies  Allergen Reactions  . Demerol [Meperidine] Anaphylaxis  . Hydrocodone-Acetaminophen Nausea And Vomiting    Medications: Prior to Admission medications   Medication Sig Start Date End Date Taking? Authorizing Provider  clonazePAM (KLONOPIN) 0.5 MG tablet Take 0.25 mg by mouth 2 (two) times daily as needed for anxiety.   Yes [provider]  ramipril (ALTACE) 10 MG capsule Take 10 mg by mouth daily.   Yes [provider]  simvastatin (ZOCOR) 20 MG tablet Take 20 mg by mouth daily.   Yes [provider]  amoxicillin (AMOXIL) 500 MG capsule Take 1 capsule (500 mg total)  by mouth 3 (three) times daily. 07/24/16   Jola Schmidt, MD    Scheduled Meds: .  stroke: mapping our early stages of recovery book   Does not apply Once  . aspirin EC  162 mg Oral Daily  . enoxaparin (LOVENOX) injection  40 mg Subcutaneous Q24H   Continuous Infusions: PRN Meds:.acetaminophen **OR** acetaminophen (TYLENOL) oral liquid 160 mg/5 mL **OR** acetaminophen, ondansetron **OR** ondansetron (ZOFRAN) IV     Results for orders placed or performed during the hospital encounter of 02/19/18 (from the past 48 hour(s))  Comprehensive metabolic panel     Status: Abnormal   Collection Time: 02/19/18  1:28 AM  Result Value Ref Range   Sodium 137 135 - 145 mmol/L   Potassium 3.6 3.5 - 5.1 mmol/L   Chloride 102 101 - 111 mmol/L   CO2 26 22 - 32 mmol/L   Glucose, Bld 101 (H) 65 - 99 mg/dL   BUN 9 6 - 20 mg/dL   Creatinine, Ser 0.85 0.44 - 1.00 mg/dL   Calcium 9.0 8.9 - 10.3 mg/dL   Total Protein 6.8 6.5 - 8.1 g/dL   Albumin 4.0 3.5 - 5.0 g/dL   AST 21 15 - 41 U/L   ALT 10 (L) 14 - 54 U/L   Alkaline Phosphatase 47 38 - 126 U/L   Total Bilirubin 0.7 0.3 - 1.2 mg/dL   GFR calc non Af Amer >60 >60 mL/min   GFR calc Af Amer >60 >60 mL/min    Comment: (NOTE) The eGFR has been calculated using the CKD EPI equation. This calculation has not been validated in all clinical situations. eGFR's persistently <60 mL/min signify possible Chronic Kidney Disease.    Anion gap 9 5 - 15    Comment: Performed at [facility], [address] [place], [place] [address]  CBC WITH DIFFERENTIAL     Status: None   Collection Time: 02/19/18  1:28 AM  Result Value Ref Range   WBC 6.5 4.0 - 10.5 K/uL   RBC 4.48 3.87 - 5.11 MIL/uL   Hemoglobin 13.1 12.0 - 15.0 g/dL   HCT 40.5 36.0 - 46.0 %   MCV 90.4 78.0 - 100.0 fL   MCH 29.2 26.0 - 34.0 pg   MCHC 32.3 30.0 - 36.0 g/dL   RDW 13.7 11.5 - 15.5 %   Platelets 214 150 - 400 K/uL   Neutrophils Relative % 80 %   Neutro Abs 5.3 1.7 - 7.7 K/uL    Lymphocytes Relative 12 %   Lymphs Abs 0.8 0.7 - 4.0 K/uL   Monocytes Relative 5 %   Monocytes Absolute 0.4 0.1 - 1.0 K/uL   Eosinophils Relative 2 %   Eosinophils Absolute 0.1 0.0 - 0.7 K/uL   Basophils Relative 1 %   Basophils Absolute 0.0 0.0 - 0.1 K/uL    Comment: Performed at [facility],  [address], [place], [place] [address]  Blood Culture (routine x 2)     Status: None (Preliminary result)   Collection Time: 02/19/18  1:28 AM  Result Value Ref Range   Specimen Description RIGHT ANTECUBITAL    Special Requests      BOTTLES DRAWN AEROBIC AND ANAEROBIC Blood Culture adequate volume DRAWN BY RN   Culture      NO GROWTH 1 DAY Performed at [facility], [address], [place], [place] [address]    Report Status PENDING   Blood Culture (routine x 2)     Status: None (Preliminary result)   Collection Time: 02/19/18  1:40 AM  Result Value Ref Range   Specimen Description BLOOD RIGHT FOREARM    Special Requests      BOTTLES DRAWN AEROBIC AND ANAEROBIC BOTTLES DRAWN AEROBIC AND ANAEROBIC DRAWN BY RN   Culture      NO GROWTH 1 DAY Performed at [facility], [address], [place], [place] [address]    Report Status PENDING   I-Stat CG4 Lactic Acid, ED  (not at  [facility]     Status: None   Collection Time: 02/19/18  1:46 AM  Result Value Ref Range   Lactic Acid, Venous 1.48 0.5 - 1.9 mmol/L  Urinalysis, Routine w reflex microscopic     Status: Abnormal   Collection Time: 02/19/18  2:43 AM  Result Value Ref Range   Color, Urine YELLOW YELLOW   APPearance HAZY (A) CLEAR   Specific Gravity, Urine 1.012 1.005 - 1.030   pH 8.0 5.0 - 8.0   Glucose, UA NEGATIVE NEGATIVE mg/dL   Hgb urine dipstick MODERATE (A) NEGATIVE   Bilirubin Urine NEGATIVE NEGATIVE   Ketones, ur 5 (A) NEGATIVE mg/dL   Protein, ur NEGATIVE NEGATIVE mg/dL   Nitrite NEGATIVE NEGATIVE   Leukocytes, UA NEGATIVE NEGATIVE   RBC / HPF >50 (H) 0 - 5 RBC/hpf   WBC, UA 0-5 0 - 5 WBC/hpf   Bacteria, UA NONE SEEN NONE  SEEN    Comment: Performed at [facility], [address], [place], [place] [address]  I-Stat CG4 Lactic Acid, ED  (not at  [facility])     Status: None   Collection Time: 02/19/18  3:02 AM  Result Value Ref Range   Lactic Acid, Venous 0.69 0.5 - 1.9 mmol/L  Hemoglobin A1c     Status: None   Collection Time: 02/19/18  6:29 AM  Result Value Ref Range   Hgb A1c MFr Bld 5.2 4.8 - 5.6 %    Comment: (NOTE) Pre diabetes:          5.7%-6.4% Diabetes:              >6.4% Glycemic control for   <7.0% adults with diabetes    Mean Plasma Glucose 102.54 mg/dL    Comment: Performed at [facility], [address], [place],  [address]  Lipid panel     Status: None   Collection Time: 02/19/18  6:29 AM  Result Value Ref Range   Cholesterol 119 0 - 200 mg/dL   Triglycerides 45 <150 mg/dL   HDL 43 >40 mg/dL   Total CHOL/HDL Ratio 2.8 RATIO   VLDL 9 0 - 40 mg/dL   LDL Cholesterol 67 0 - 99 mg/dL    Comment:        Total Cholesterol/HDL:CHD Risk Coronary Heart Disease Risk Table  Men   Women  1/2 Average Risk   3.4   3.3  Average Risk       5.0   4.4  2 X Average Risk   9.6   7.1  3 X Average Risk  23.4   11.0        Use the calculated Patient Ratio above and the CHD Risk Table to determine the patient's CHD Risk.        ATP III CLASSIFICATION (LDL):  <100     mg/dL   Optimal  100-129  mg/dL   Near or Above                    Optimal  130-159  mg/dL   Borderline  160-189  mg/dL   High  >190     mg/dL   Very High Performed at [facility] [place], [place] [address]   Comprehensive metabolic panel     Status: Abnormal   Collection Time: 02/20/18  5:34 AM  Result Value Ref Range   Sodium 139 135 - 145 mmol/L   Potassium 3.6 3.5 - 5.1 mmol/L   Chloride 108 101 - 111 mmol/L   CO2 25 22 - 32 mmol/L   Glucose, Bld 86 65 - 99 mg/dL   BUN 7 6 - 20 mg/dL   Creatinine, Ser 0.80 0.44 - 1.00 mg/dL   Calcium 8.6 (L) 8.9 - 10.3 mg/dL   Total Protein  5.8 (L) 6.5 - 8.1 g/dL   Albumin 3.3 (L) 3.5 - 5.0 g/dL   AST 16 15 - 41 U/L   ALT 10 (L) 14 - 54 U/L   Alkaline Phosphatase 40 38 - 126 U/L   Total Bilirubin 0.8 0.3 - 1.2 mg/dL   GFR calc non Af Amer >60 >60 mL/min   GFR calc Af Amer >60 >60 mL/min    Comment: (NOTE) The eGFR has been calculated using the CKD EPI equation. This calculation has not been validated in all clinical situations. eGFR's persistently <60 mL/min signify possible Chronic Kidney Disease.    Anion gap 6 5 - 15    Comment: Performed at [facility], [address] [place], [place] [address]  CBC with Differential/Platelet     Status: None   Collection Time: 02/20/18  5:34 AM  Result Value Ref Range   WBC 5.1 4.0 - 10.5 K/uL   RBC 4.39 3.87 - 5.11 MIL/uL   Hemoglobin 12.4 12.0 - 15.0 g/dL   HCT 39.8 36.0 - 46.0 %   MCV 90.7 78.0 - 100.0 fL   MCH 28.2 26.0 - 34.0 pg   MCHC 31.2 30.0 - 36.0 g/dL   RDW 13.8 11.5 - 15.5 %   Platelets 216 150 - 400 K/uL   Neutrophils Relative % 65 %   Neutro Abs 3.3 1.7 - 7.7 K/uL   Lymphocytes Relative 22 %   Lymphs Abs 1.1 0.7 - 4.0 K/uL   Monocytes Relative 9 %   Monocytes Absolute 0.5 0.1 - 1.0 K/uL   Eosinophils Relative 3 %   Eosinophils Absolute 0.1 0.0 - 0.7 K/uL   Basophils Relative 1 %   Basophils Absolute 0.0 0.0 - 0.1 K/uL    Comment: Performed at [facility], [address], [place], [place] [address]    Studies/Results:  BRAIN MRI MRA  FINDINGS: MRI HEAD FINDINGS  Significantly motion degraded diffusion, T1, and axial fluid sensitive sequences were acquired. There is a moderate area of restricted  diffusion centered in the left parietal cortex. Smaller area of gliosis in the posterior left frontal lobe. No evidence of hemorrhage, hydrocephalus, collection, or mass. Mild atrophy and periventricular chronic small vessel ischemia. Xanthogranulomas of the choroid plexus.  MRA HEAD FINDINGS  Significantly motion degraded. Strong left vertebral  artery dominance, known from cervical MRI in 2006. The vertebral, basilar, and carotid arteries are patent. Grossly symmetric flow related signal and bilateral MCA, PCA and ACA branches.  IMPRESSION: Brain MRI:  1. Moderate acute infarct in the left parietal cortex. 2. Small remote left posterior frontal infarct. 3. Mild for age atrophy and chronic small vessel disease. 4. Incomplete and severely motion degraded exam.  Intracranial MRA:  1. Severely motion degraded. 2. No detectable proximal occlusion.     Carotid  IMPRESSION: Minimal amount of bilateral intimal thickening and atherosclerotic plaque, right subjectively greater than left, similar to the 03/2014 examination and again not definitely resulting in a hemodynamically significant stenosis within either internal carotid artery.      ECHO - Left ventricle: The cavity size was normal. Wall thickness was   normal. Systolic function was normal. The estimated ejection   fraction was in the range of 60% to 65%. Wall motion was normal;   there were no regional wall motion abnormalities. Doppler   parameters are consistent with abnormal left ventricular   relaxation (grade 1 diastolic dysfunction). - Aortic valve: Mildly calcified annulus. Trileaflet; normal   thickness leaflets. Valve area (VTI): 2.7 cm^2. Valve area   (Vmax): 2.2 cm^2. - Technically adequate study.      The brain MRI and MRA reviewed impression.  There is increased signal involving the left parietal area.  The increased signal is associated with reduced signal on the ADC scan.  The area mostly involves the cortical rim.  MRA is degraded significantly but shows no clear occlusive intracranial disease.  There is mild global atrophy and mild chronic microvascular ischemic changes.   Mayo Owczarzak A. Merlene Laughter, M.D.  Diplomate, Tax adviser of Psychiatry and Neurology ( Neurology). 02/20/2018, 8:35 AM

## 2018-02-22 DIAGNOSIS — I10 Essential (primary) hypertension: Secondary | ICD-10-CM | POA: Diagnosis not present

## 2018-02-22 DIAGNOSIS — I69322 Dysarthria following cerebral infarction: Secondary | ICD-10-CM | POA: Diagnosis not present

## 2018-02-22 DIAGNOSIS — E785 Hyperlipidemia, unspecified: Secondary | ICD-10-CM | POA: Diagnosis not present

## 2018-02-22 DIAGNOSIS — E43 Unspecified severe protein-calorie malnutrition: Secondary | ICD-10-CM | POA: Diagnosis not present

## 2018-02-22 DIAGNOSIS — W19XXXD Unspecified fall, subsequent encounter: Secondary | ICD-10-CM | POA: Diagnosis not present

## 2018-02-22 DIAGNOSIS — Z7982 Long term (current) use of aspirin: Secondary | ICD-10-CM | POA: Diagnosis not present

## 2018-02-22 DIAGNOSIS — I69321 Dysphasia following cerebral infarction: Secondary | ICD-10-CM | POA: Diagnosis not present

## 2018-02-22 DIAGNOSIS — S50912D Unspecified superficial injury of left forearm, subsequent encounter: Secondary | ICD-10-CM | POA: Diagnosis not present

## 2018-02-23 ENCOUNTER — Other Ambulatory Visit: Payer: Self-pay

## 2018-02-23 DIAGNOSIS — I69322 Dysarthria following cerebral infarction: Secondary | ICD-10-CM | POA: Diagnosis not present

## 2018-02-23 DIAGNOSIS — I69321 Dysphasia following cerebral infarction: Secondary | ICD-10-CM | POA: Diagnosis not present

## 2018-02-23 DIAGNOSIS — S50912D Unspecified superficial injury of left forearm, subsequent encounter: Secondary | ICD-10-CM | POA: Diagnosis not present

## 2018-02-23 DIAGNOSIS — E43 Unspecified severe protein-calorie malnutrition: Secondary | ICD-10-CM | POA: Diagnosis not present

## 2018-02-23 DIAGNOSIS — I10 Essential (primary) hypertension: Secondary | ICD-10-CM | POA: Diagnosis not present

## 2018-02-23 DIAGNOSIS — E785 Hyperlipidemia, unspecified: Secondary | ICD-10-CM | POA: Diagnosis not present

## 2018-02-23 NOTE — Patient Outreach (Signed)
[facility]) Care Management  02/23/2018  Amy Sawyer July 04, 1931 [id number]   EMMI- General Discharge RED ON EMMI ALERT Day # 1 Date: 02/22/18 Red Alert Reason:  Know who to call about changes in condition? No   Outreach attempt # 1 spoke with patient. She is able to verify HIPAA.  Discussed red alert.  She states that was an incorrect response.  They know to contact primary care physician.  Reports that Dr. Willey Blade called yesterday and that appointment sent for next week.  Patient voices no concerns.    Discussed [facility] services for support.  Patient declined but agreeable to letter and brochure.    Plan: RN CM will close case and send letter.  Jone Baseman, RN, MSN [facility] Care Management Care Management Coordinator Direct Line [phone] Toll Free: [phone]  Fax: [phone]

## 2018-02-24 DIAGNOSIS — E785 Hyperlipidemia, unspecified: Secondary | ICD-10-CM | POA: Diagnosis not present

## 2018-02-24 DIAGNOSIS — E43 Unspecified severe protein-calorie malnutrition: Secondary | ICD-10-CM | POA: Diagnosis not present

## 2018-02-24 DIAGNOSIS — I10 Essential (primary) hypertension: Secondary | ICD-10-CM | POA: Diagnosis not present

## 2018-02-24 DIAGNOSIS — S50912D Unspecified superficial injury of left forearm, subsequent encounter: Secondary | ICD-10-CM | POA: Diagnosis not present

## 2018-02-24 DIAGNOSIS — I69322 Dysarthria following cerebral infarction: Secondary | ICD-10-CM | POA: Diagnosis not present

## 2018-02-24 DIAGNOSIS — I69321 Dysphasia following cerebral infarction: Secondary | ICD-10-CM | POA: Diagnosis not present

## 2018-02-24 LAB — CULTURE, BLOOD (ROUTINE X 2)
CULTURE: NO GROWTH
CULTURE: NO GROWTH

## 2018-02-26 DIAGNOSIS — I69322 Dysarthria following cerebral infarction: Secondary | ICD-10-CM | POA: Diagnosis not present

## 2018-02-26 DIAGNOSIS — E43 Unspecified severe protein-calorie malnutrition: Secondary | ICD-10-CM | POA: Diagnosis not present

## 2018-02-26 DIAGNOSIS — E785 Hyperlipidemia, unspecified: Secondary | ICD-10-CM | POA: Diagnosis not present

## 2018-02-26 DIAGNOSIS — S50912D Unspecified superficial injury of left forearm, subsequent encounter: Secondary | ICD-10-CM | POA: Diagnosis not present

## 2018-02-26 DIAGNOSIS — Z8673 Personal history of transient ischemic attack (TIA), and cerebral infarction without residual deficits: Secondary | ICD-10-CM | POA: Diagnosis not present

## 2018-02-26 DIAGNOSIS — I10 Essential (primary) hypertension: Secondary | ICD-10-CM | POA: Diagnosis not present

## 2018-02-26 DIAGNOSIS — I700 Atherosclerosis of aorta: Secondary | ICD-10-CM | POA: Diagnosis not present

## 2018-02-26 DIAGNOSIS — I69321 Dysphasia following cerebral infarction: Secondary | ICD-10-CM | POA: Diagnosis not present

## 2018-02-28 DIAGNOSIS — E785 Hyperlipidemia, unspecified: Secondary | ICD-10-CM | POA: Diagnosis not present

## 2018-02-28 DIAGNOSIS — I69322 Dysarthria following cerebral infarction: Secondary | ICD-10-CM | POA: Diagnosis not present

## 2018-02-28 DIAGNOSIS — I69321 Dysphasia following cerebral infarction: Secondary | ICD-10-CM | POA: Diagnosis not present

## 2018-02-28 DIAGNOSIS — E43 Unspecified severe protein-calorie malnutrition: Secondary | ICD-10-CM | POA: Diagnosis not present

## 2018-02-28 DIAGNOSIS — S50912D Unspecified superficial injury of left forearm, subsequent encounter: Secondary | ICD-10-CM | POA: Diagnosis not present

## 2018-02-28 DIAGNOSIS — I10 Essential (primary) hypertension: Secondary | ICD-10-CM | POA: Diagnosis not present

## 2018-03-01 DIAGNOSIS — S50912D Unspecified superficial injury of left forearm, subsequent encounter: Secondary | ICD-10-CM | POA: Diagnosis not present

## 2018-03-01 DIAGNOSIS — E43 Unspecified severe protein-calorie malnutrition: Secondary | ICD-10-CM | POA: Diagnosis not present

## 2018-03-01 DIAGNOSIS — E785 Hyperlipidemia, unspecified: Secondary | ICD-10-CM | POA: Diagnosis not present

## 2018-03-01 DIAGNOSIS — I10 Essential (primary) hypertension: Secondary | ICD-10-CM | POA: Diagnosis not present

## 2018-03-01 DIAGNOSIS — I69322 Dysarthria following cerebral infarction: Secondary | ICD-10-CM | POA: Diagnosis not present

## 2018-03-01 DIAGNOSIS — I69321 Dysphasia following cerebral infarction: Secondary | ICD-10-CM | POA: Diagnosis not present

## 2018-03-02 DIAGNOSIS — I10 Essential (primary) hypertension: Secondary | ICD-10-CM | POA: Diagnosis not present

## 2018-03-02 DIAGNOSIS — S50912D Unspecified superficial injury of left forearm, subsequent encounter: Secondary | ICD-10-CM | POA: Diagnosis not present

## 2018-03-02 DIAGNOSIS — I69321 Dysphasia following cerebral infarction: Secondary | ICD-10-CM | POA: Diagnosis not present

## 2018-03-02 DIAGNOSIS — E785 Hyperlipidemia, unspecified: Secondary | ICD-10-CM | POA: Diagnosis not present

## 2018-03-02 DIAGNOSIS — I69322 Dysarthria following cerebral infarction: Secondary | ICD-10-CM | POA: Diagnosis not present

## 2018-03-02 DIAGNOSIS — E43 Unspecified severe protein-calorie malnutrition: Secondary | ICD-10-CM | POA: Diagnosis not present

## 2018-03-07 DIAGNOSIS — I69321 Dysphasia following cerebral infarction: Secondary | ICD-10-CM | POA: Diagnosis not present

## 2018-03-07 DIAGNOSIS — I10 Essential (primary) hypertension: Secondary | ICD-10-CM | POA: Diagnosis not present

## 2018-03-07 DIAGNOSIS — I69322 Dysarthria following cerebral infarction: Secondary | ICD-10-CM | POA: Diagnosis not present

## 2018-03-07 DIAGNOSIS — E43 Unspecified severe protein-calorie malnutrition: Secondary | ICD-10-CM | POA: Diagnosis not present

## 2018-03-07 DIAGNOSIS — S50912D Unspecified superficial injury of left forearm, subsequent encounter: Secondary | ICD-10-CM | POA: Diagnosis not present

## 2018-03-07 DIAGNOSIS — E785 Hyperlipidemia, unspecified: Secondary | ICD-10-CM | POA: Diagnosis not present

## 2018-03-09 DIAGNOSIS — E785 Hyperlipidemia, unspecified: Secondary | ICD-10-CM | POA: Diagnosis not present

## 2018-03-09 DIAGNOSIS — E43 Unspecified severe protein-calorie malnutrition: Secondary | ICD-10-CM | POA: Diagnosis not present

## 2018-03-09 DIAGNOSIS — I69321 Dysphasia following cerebral infarction: Secondary | ICD-10-CM | POA: Diagnosis not present

## 2018-03-09 DIAGNOSIS — I69322 Dysarthria following cerebral infarction: Secondary | ICD-10-CM | POA: Diagnosis not present

## 2018-03-09 DIAGNOSIS — I10 Essential (primary) hypertension: Secondary | ICD-10-CM | POA: Diagnosis not present

## 2018-03-09 DIAGNOSIS — S50912D Unspecified superficial injury of left forearm, subsequent encounter: Secondary | ICD-10-CM | POA: Diagnosis not present

## 2018-03-12 DIAGNOSIS — E43 Unspecified severe protein-calorie malnutrition: Secondary | ICD-10-CM | POA: Diagnosis not present

## 2018-03-12 DIAGNOSIS — E785 Hyperlipidemia, unspecified: Secondary | ICD-10-CM | POA: Diagnosis not present

## 2018-03-12 DIAGNOSIS — I69322 Dysarthria following cerebral infarction: Secondary | ICD-10-CM | POA: Diagnosis not present

## 2018-03-12 DIAGNOSIS — I10 Essential (primary) hypertension: Secondary | ICD-10-CM | POA: Diagnosis not present

## 2018-03-12 DIAGNOSIS — S50912D Unspecified superficial injury of left forearm, subsequent encounter: Secondary | ICD-10-CM | POA: Diagnosis not present

## 2018-03-12 DIAGNOSIS — I69321 Dysphasia following cerebral infarction: Secondary | ICD-10-CM | POA: Diagnosis not present

## 2018-03-13 DIAGNOSIS — I10 Essential (primary) hypertension: Secondary | ICD-10-CM | POA: Diagnosis not present

## 2018-03-13 DIAGNOSIS — Z8673 Personal history of transient ischemic attack (TIA), and cerebral infarction without residual deficits: Secondary | ICD-10-CM | POA: Diagnosis not present

## 2018-03-14 DIAGNOSIS — I10 Essential (primary) hypertension: Secondary | ICD-10-CM | POA: Diagnosis not present

## 2018-03-14 DIAGNOSIS — E785 Hyperlipidemia, unspecified: Secondary | ICD-10-CM | POA: Diagnosis not present

## 2018-03-14 DIAGNOSIS — S50912D Unspecified superficial injury of left forearm, subsequent encounter: Secondary | ICD-10-CM | POA: Diagnosis not present

## 2018-03-14 DIAGNOSIS — I69322 Dysarthria following cerebral infarction: Secondary | ICD-10-CM | POA: Diagnosis not present

## 2018-03-14 DIAGNOSIS — E43 Unspecified severe protein-calorie malnutrition: Secondary | ICD-10-CM | POA: Diagnosis not present

## 2018-03-14 DIAGNOSIS — I69321 Dysphasia following cerebral infarction: Secondary | ICD-10-CM | POA: Diagnosis not present

## 2018-03-15 DIAGNOSIS — I10 Essential (primary) hypertension: Secondary | ICD-10-CM | POA: Diagnosis not present

## 2018-03-15 DIAGNOSIS — S50912D Unspecified superficial injury of left forearm, subsequent encounter: Secondary | ICD-10-CM | POA: Diagnosis not present

## 2018-03-15 DIAGNOSIS — E785 Hyperlipidemia, unspecified: Secondary | ICD-10-CM | POA: Diagnosis not present

## 2018-03-15 DIAGNOSIS — I69321 Dysphasia following cerebral infarction: Secondary | ICD-10-CM | POA: Diagnosis not present

## 2018-03-15 DIAGNOSIS — E43 Unspecified severe protein-calorie malnutrition: Secondary | ICD-10-CM | POA: Diagnosis not present

## 2018-03-15 DIAGNOSIS — I69322 Dysarthria following cerebral infarction: Secondary | ICD-10-CM | POA: Diagnosis not present

## 2018-03-16 DIAGNOSIS — I69322 Dysarthria following cerebral infarction: Secondary | ICD-10-CM | POA: Diagnosis not present

## 2018-03-16 DIAGNOSIS — E785 Hyperlipidemia, unspecified: Secondary | ICD-10-CM | POA: Diagnosis not present

## 2018-03-16 DIAGNOSIS — I10 Essential (primary) hypertension: Secondary | ICD-10-CM | POA: Diagnosis not present

## 2018-03-16 DIAGNOSIS — I69321 Dysphasia following cerebral infarction: Secondary | ICD-10-CM | POA: Diagnosis not present

## 2018-03-16 DIAGNOSIS — S50912D Unspecified superficial injury of left forearm, subsequent encounter: Secondary | ICD-10-CM | POA: Diagnosis not present

## 2018-03-16 DIAGNOSIS — E43 Unspecified severe protein-calorie malnutrition: Secondary | ICD-10-CM | POA: Diagnosis not present

## 2018-03-19 DIAGNOSIS — E43 Unspecified severe protein-calorie malnutrition: Secondary | ICD-10-CM | POA: Diagnosis not present

## 2018-03-19 DIAGNOSIS — S50912D Unspecified superficial injury of left forearm, subsequent encounter: Secondary | ICD-10-CM | POA: Diagnosis not present

## 2018-03-19 DIAGNOSIS — E785 Hyperlipidemia, unspecified: Secondary | ICD-10-CM | POA: Diagnosis not present

## 2018-03-19 DIAGNOSIS — I69322 Dysarthria following cerebral infarction: Secondary | ICD-10-CM | POA: Diagnosis not present

## 2018-03-19 DIAGNOSIS — I10 Essential (primary) hypertension: Secondary | ICD-10-CM | POA: Diagnosis not present

## 2018-03-19 DIAGNOSIS — I69321 Dysphasia following cerebral infarction: Secondary | ICD-10-CM | POA: Diagnosis not present

## 2018-04-06 ENCOUNTER — Telehealth: Payer: Self-pay

## 2018-04-06 NOTE — Telephone Encounter (Signed)
Received fax from [facility] that "Patient decided against study"

## 2018-04-09 DIAGNOSIS — I69398 Other sequelae of cerebral infarction: Secondary | ICD-10-CM | POA: Diagnosis not present

## 2018-04-09 DIAGNOSIS — I10 Essential (primary) hypertension: Secondary | ICD-10-CM | POA: Diagnosis not present

## 2018-04-09 DIAGNOSIS — I69393 Ataxia following cerebral infarction: Secondary | ICD-10-CM | POA: Diagnosis not present

## 2018-04-09 DIAGNOSIS — E782 Mixed hyperlipidemia: Secondary | ICD-10-CM | POA: Diagnosis not present

## 2018-04-23 DIAGNOSIS — I639 Cerebral infarction, unspecified: Secondary | ICD-10-CM | POA: Diagnosis not present

## 2018-04-26 DIAGNOSIS — R569 Unspecified convulsions: Secondary | ICD-10-CM | POA: Diagnosis not present

## 2018-06-18 DIAGNOSIS — Z23 Encounter for immunization: Secondary | ICD-10-CM | POA: Diagnosis not present

## 2018-07-16 DIAGNOSIS — I10 Essential (primary) hypertension: Secondary | ICD-10-CM | POA: Diagnosis not present

## 2018-07-16 DIAGNOSIS — Z8673 Personal history of transient ischemic attack (TIA), and cerebral infarction without residual deficits: Secondary | ICD-10-CM | POA: Diagnosis not present

## 2018-08-27 DIAGNOSIS — I10 Essential (primary) hypertension: Secondary | ICD-10-CM | POA: Diagnosis not present

## 2018-08-27 DIAGNOSIS — Z8673 Personal history of transient ischemic attack (TIA), and cerebral infarction without residual deficits: Secondary | ICD-10-CM | POA: Diagnosis not present

## 2018-10-18 ENCOUNTER — Ambulatory Visit (INDEPENDENT_AMBULATORY_CARE_PROVIDER_SITE_OTHER): Payer: Medicare Other | Admitting: Podiatry

## 2018-10-18 DIAGNOSIS — B351 Tinea unguium: Secondary | ICD-10-CM

## 2018-10-18 DIAGNOSIS — M79609 Pain in unspecified limb: Secondary | ICD-10-CM | POA: Diagnosis not present

## 2018-11-03 NOTE — Progress Notes (Signed)
  Subjective:  Patient ID: Amy Sawyer, female    DOB: 18-May-1931,  MRN: [id number]  Chief Complaint  Patient presents with  . Nail Problem    Nails growing into skin bilateral 1-5  . Hammer Toe    Bilateral 1-5 Hammertoes, pt not interested in correction    85 y.o. female presents with the above complaint.  Reports painfully elongated nails to both feet.  Review of Systems: Negative except as noted in the HPI. Denies N/V/F/Ch.  Past Medical History:  Diagnosis Date  . HTN (hypertension)   . Hyperlipemia   . Stroke [facility])     Current Outpatient Medications:  .  amLODipine (NORVASC) 2.5 MG tablet, , Disp: , Rfl:  .  aspirin EC 325 MG EC tablet, Take 1 tablet (325 mg total) by mouth daily., Disp: 30 tablet, Rfl: 0 .  clonazePAM (KLONOPIN) 0.5 MG tablet, Take 0.25 mg by mouth 2 (two) times daily as needed for anxiety., Disp: , Rfl:  .  ramipril (ALTACE) 10 MG capsule, Take 10 mg by mouth daily., Disp: , Rfl:  .  simvastatin (ZOCOR) 20 MG tablet, Take 20 mg by mouth daily., Disp: , Rfl:   Social History   Tobacco Use  Smoking Status Never Smoker  Smokeless Tobacco Never Used    Allergies  Allergen Reactions  . Demerol [Meperidine] Anaphylaxis  . Hydrocodone-Acetaminophen Nausea And Vomiting   Objective:  There were no vitals filed for this visit. There is no height or weight on file to calculate BMI. Constitutional Well developed. Well nourished. Lack of self care of feet  Vascular Dorsalis pedis pulses palpable bilaterally. Posterior tibial pulses palpable bilaterally. Capillary refill normal to all digits.  No cyanosis or clubbing noted. Pedal hair growth normal.  Neurologic Normal speech. Oriented to person, place, and time. Epicritic sensation to light touch grossly present bilaterally.  Dermatologic Nails elongated dystrophic pain to palpation No open wounds. No skin lesions.  Orthopedic: Normal joint ROM without pain or crepitus bilaterally. No visible  deformities. No bony tenderness.   Radiographs: None Assessment:   1. Pain due to onychomycosis of nail    Plan:  Patient was evaluated and treated and all questions answered.  Onychomycosis with pain -Nails palliatively debridement as below -Educated on self-care  Procedure: Nail Debridement Rationale: Pain Type of Debridement: manual, sharp debridement. Instrumentation: Nail nipper, rotary burr. Number of Nails: 10    No follow-ups on file.

## 2018-11-05 DIAGNOSIS — N3281 Overactive bladder: Secondary | ICD-10-CM | POA: Diagnosis not present

## 2018-11-26 DIAGNOSIS — N3281 Overactive bladder: Secondary | ICD-10-CM | POA: Diagnosis not present

## 2018-11-26 DIAGNOSIS — I10 Essential (primary) hypertension: Secondary | ICD-10-CM | POA: Diagnosis not present

## 2019-01-30 DIAGNOSIS — N390 Urinary tract infection, site not specified: Secondary | ICD-10-CM | POA: Diagnosis not present

## 2019-01-30 DIAGNOSIS — N3000 Acute cystitis without hematuria: Secondary | ICD-10-CM | POA: Diagnosis not present

## 2019-01-30 DIAGNOSIS — N3281 Overactive bladder: Secondary | ICD-10-CM | POA: Diagnosis not present

## 2019-05-28 DIAGNOSIS — I10 Essential (primary) hypertension: Secondary | ICD-10-CM | POA: Diagnosis not present

## 2019-05-28 DIAGNOSIS — G4700 Insomnia, unspecified: Secondary | ICD-10-CM | POA: Diagnosis not present

## 2019-05-28 DIAGNOSIS — E789 Disorder of lipoprotein metabolism, unspecified: Secondary | ICD-10-CM | POA: Diagnosis not present

## 2019-05-28 DIAGNOSIS — E785 Hyperlipidemia, unspecified: Secondary | ICD-10-CM | POA: Diagnosis not present

## 2019-05-28 DIAGNOSIS — G464 Cerebellar stroke syndrome: Secondary | ICD-10-CM | POA: Diagnosis not present

## 2019-05-28 DIAGNOSIS — N3281 Overactive bladder: Secondary | ICD-10-CM | POA: Diagnosis not present

## 2019-05-28 DIAGNOSIS — Z79899 Other long term (current) drug therapy: Secondary | ICD-10-CM | POA: Diagnosis not present

## 2019-06-03 DIAGNOSIS — Z23 Encounter for immunization: Secondary | ICD-10-CM | POA: Diagnosis not present

## 2019-06-04 DIAGNOSIS — I10 Essential (primary) hypertension: Secondary | ICD-10-CM | POA: Diagnosis not present

## 2019-06-04 DIAGNOSIS — Z8673 Personal history of transient ischemic attack (TIA), and cerebral infarction without residual deficits: Secondary | ICD-10-CM | POA: Diagnosis not present

## 2019-06-04 DIAGNOSIS — E785 Hyperlipidemia, unspecified: Secondary | ICD-10-CM | POA: Diagnosis not present

## 2019-08-07 DIAGNOSIS — N390 Urinary tract infection, site not specified: Secondary | ICD-10-CM | POA: Diagnosis not present

## 2019-09-19 ENCOUNTER — Other Ambulatory Visit: Payer: Self-pay

## 2019-09-19 ENCOUNTER — Ambulatory Visit (INDEPENDENT_AMBULATORY_CARE_PROVIDER_SITE_OTHER): Payer: Medicare Other | Admitting: General Surgery

## 2019-09-19 ENCOUNTER — Encounter: Payer: Self-pay | Admitting: General Surgery

## 2019-09-19 VITALS — BP 153/76 | HR 72 | Temp 98.0°F | Resp 16 | Ht 62.0 in | Wt 123.0 lb

## 2019-09-19 DIAGNOSIS — K4090 Unilateral inguinal hernia, without obstruction or gangrene, not specified as recurrent: Secondary | ICD-10-CM | POA: Diagnosis not present

## 2019-09-19 NOTE — Patient Instructions (Signed)

## 2019-09-20 NOTE — Progress Notes (Signed)
Amy Sawyer; [id number]; Sep 08, 1930   HPI Patient is an 86 year old white female who was referred to my care by Dr. Asencion Noble for evaluation treatment of a right inguinal hernia. Patient states that Dr. Willey Blade found this on routine physical examination. She denies any significant pain. She is hard of hearing. She occasionally notices a lump in the right groin. She denies any nausea or vomiting. She currently has 0 out of 10 abdominal pain. Past Medical History:  Diagnosis Date  . HTN (hypertension)   . Hyperlipemia   . Stroke [facility])     Past Surgical History:  Procedure Laterality Date  . BREAST SURGERY    . LEG SURGERY      Family History  Problem Relation Age of Onset  . Heart disease Unknown   . Diabetes Unknown   . Stroke Mother     Current Outpatient Medications on File Prior to Visit  Medication Sig Dispense Refill  . amLODipine (NORVASC) 2.5 MG tablet     . aspirin EC 325 MG EC tablet Take 1 tablet (325 mg total) by mouth daily. 30 tablet 0  . clonazePAM (KLONOPIN) 0.5 MG tablet Take 0.25 mg by mouth 2 (two) times daily as needed for anxiety.    . ramipril (ALTACE) 10 MG capsule Take 10 mg by mouth daily.    . simvastatin (ZOCOR) 20 MG tablet Take 20 mg by mouth daily.     No current facility-administered medications on file prior to visit.    Allergies  Allergen Reactions  . Demerol [Meperidine] Anaphylaxis  . Hydrocodone-Acetaminophen Nausea And Vomiting    Social History   Substance and Sexual Activity  Alcohol Use No    Social History   Tobacco Use  Smoking Status Never Smoker  Smokeless Tobacco Never Used    Review of Systems  Constitutional: Negative.   HENT: Positive for hearing loss.   Eyes: Negative.   Respiratory: Negative.   Cardiovascular: Negative.   Gastrointestinal: Negative.   Genitourinary: Negative.   Musculoskeletal: Negative.   Skin: Negative.   Neurological: Negative.   Endo/Heme/Allergies: Negative.    Psychiatric/Behavioral: Negative.     Objective   Vitals:   09/19/19 1030  BP: (!) 153/76  Pulse: 72  Resp: 16  Temp: 98 F (36.7 C)  SpO2: 95%    Physical Exam Vitals reviewed.  Constitutional:      Appearance: Normal appearance. She is not ill-appearing.  HENT:     Head: Normocephalic and atraumatic.  Cardiovascular:     Rate and Rhythm: Normal rate and regular rhythm.     Heart sounds: Normal heart sounds. No murmur. No friction rub. No gallop.   Pulmonary:     Effort: Pulmonary effort is normal. No respiratory distress.     Breath sounds: Normal breath sounds. No stridor. No wheezing, rhonchi or rales.  Abdominal:     General: Abdomen is flat. There is no distension.     Palpations: Abdomen is soft. There is no mass.     Tenderness: There is no abdominal tenderness. There is no guarding or rebound.     Hernia: A hernia is present.     Comments: Easily reducible right inguinal hernia.  Skin:    General: Skin is warm and dry.  Neurological:     Mental Status: She is alert and oriented to person, place, and time.   Primary care notes reviewed  Assessment  Right inguinal hernia, asymptomatic Plan   There is no need to repair the  right inguinal hernia at this time. Patient and family agree. Patient is at low risk for incarceration. Literature was given concerning inguinal hernias. I told him to return to my care should she become symptomatic. Follow-up here as needed.

## 2019-10-16 ENCOUNTER — Ambulatory Visit: Payer: Medicare Other | Admitting: Urology

## 2019-12-03 DIAGNOSIS — K4090 Unilateral inguinal hernia, without obstruction or gangrene, not specified as recurrent: Secondary | ICD-10-CM | POA: Diagnosis not present

## 2019-12-03 DIAGNOSIS — I10 Essential (primary) hypertension: Secondary | ICD-10-CM | POA: Diagnosis not present

## 2019-12-03 DIAGNOSIS — F419 Anxiety disorder, unspecified: Secondary | ICD-10-CM | POA: Diagnosis not present

## 2020-01-24 DIAGNOSIS — L03116 Cellulitis of left lower limb: Secondary | ICD-10-CM | POA: Diagnosis not present

## 2020-01-29 DIAGNOSIS — S81802S Unspecified open wound, left lower leg, sequela: Secondary | ICD-10-CM | POA: Diagnosis not present

## 2020-04-01 DIAGNOSIS — L299 Pruritus, unspecified: Secondary | ICD-10-CM | POA: Diagnosis not present

## 2020-04-01 DIAGNOSIS — S31502A Unspecified open wound of unspecified external genital organs, female, initial encounter: Secondary | ICD-10-CM | POA: Diagnosis not present

## 2020-04-01 DIAGNOSIS — N390 Urinary tract infection, site not specified: Secondary | ICD-10-CM | POA: Diagnosis not present

## 2020-04-10 ENCOUNTER — Ambulatory Visit (INDEPENDENT_AMBULATORY_CARE_PROVIDER_SITE_OTHER): Payer: Medicare Other | Admitting: Adult Health

## 2020-04-10 ENCOUNTER — Encounter: Payer: Self-pay | Admitting: Adult Health

## 2020-04-10 VITALS — BP 172/73 | Ht 62.0 in | Wt 126.6 lb

## 2020-04-10 DIAGNOSIS — N898 Other specified noninflammatory disorders of vagina: Secondary | ICD-10-CM | POA: Diagnosis not present

## 2020-04-10 DIAGNOSIS — N760 Acute vaginitis: Secondary | ICD-10-CM | POA: Diagnosis not present

## 2020-04-10 DIAGNOSIS — N3946 Mixed incontinence: Secondary | ICD-10-CM

## 2020-04-10 DIAGNOSIS — N952 Postmenopausal atrophic vaginitis: Secondary | ICD-10-CM

## 2020-04-10 NOTE — Progress Notes (Signed)
  Subjective:     Patient ID: Amy Sawyer, female   DOB: [birth year], 86 y.o.   MRN: [id number]  HPI Amy Sawyer is an 86 year old white female, married, PM in with her daughter for vaginal itching for about 4 weeks. She is a new pt referred by Dr Willey Blade PCP is Dr Willey Blade.   Review of Systems +vaginal itching for about 4 weeks  Was treated for UTI recently Had sore near anus, but thinks it gone Has UI  Reviewed past medical,surgical, social and family history. Reviewed medications and allergies.     Objective:   Physical Exam BP (!) 172/73 (BP Location: Left Arm, Patient Position: Sitting, Cuff Size: Normal)   Ht 5\' 2"  (1.575 m)   Wt 126 lb 9.6 oz (57.4 kg)   BMI 23.16 kg/m  She walks with a cane, and is HOH. Skin warm and dry.Pelvic: external genitalia: has some moisture changes on labia and clitoral area and has, red round area base right labia at introitus and another near rectal area, with some excoriation, from scratching,  vagina: pale and atrophic,urethra has no lesions or masses noted, cervix: poorly seen, uterus: normal size, shape and contour,but difficult to assess due to ventral hernia, non tender, no masses felt, adnexa: no tenderness noted. Bladder is non tender and no masses felt. Dr Elonda Husky in for co exam of red lesion at introitus and he feels it is due to moisture and scratching. AA is 0 Fall risk is low,assisted off the table, and assisted in clean up she did lose some urine PHQ 9 score is 2, no SI Examination chaperoned by Diona Fanti CMA.    Assessment:     1. Vulvovaginitis Called "BUTT"cream(has antifungal,steriod and zinc oxide in it) in to [place] 60 gm pot use bid to affected areas with 2 refills  2. Mixed stress and urge urinary incontinence Try to keep peri area dry  3. Vaginal itching Try not to scratch Do not bath too hard  4. Vaginal atrophy     Plan:     Follow up in 3 months for recheck

## 2020-05-08 ENCOUNTER — Other Ambulatory Visit: Payer: Self-pay

## 2020-05-08 ENCOUNTER — Encounter (HOSPITAL_COMMUNITY): Payer: Self-pay

## 2020-05-08 ENCOUNTER — Emergency Department (HOSPITAL_COMMUNITY)
Admission: EM | Admit: 2020-05-08 | Discharge: 2020-05-08 | Disposition: A | Discharge status: Home / Self Care | Payer: Medicare Other | Attending: Emergency Medicine | Admitting: Emergency Medicine

## 2020-05-08 DIAGNOSIS — I959 Hypotension, unspecified: Secondary | ICD-10-CM | POA: Diagnosis not present

## 2020-05-08 DIAGNOSIS — Z5321 Procedure and treatment not carried out due to patient leaving prior to being seen by health care provider: Secondary | ICD-10-CM | POA: Diagnosis not present

## 2020-05-08 LAB — COMPREHENSIVE METABOLIC PANEL
ALT: 20 U/L (ref 0–44)
AST: 27 U/L (ref 15–41)
Albumin: 4.2 g/dL (ref 3.5–5.0)
Alkaline Phosphatase: 51 U/L (ref 38–126)
Anion gap: 12 (ref 5–15)
BUN: 10 mg/dL (ref 8–23)
CO2: 25 mmol/L (ref 22–32)
Calcium: 9.3 mg/dL (ref 8.9–10.3)
Chloride: 98 mmol/L (ref 98–111)
Creatinine, Ser: 0.86 mg/dL (ref 0.44–1.00)
GFR calc Af Amer: >60 mL/min (ref >60)
GFR calc non Af Amer: >60 mL/min (ref >60)
Glucose, Bld: 94 mg/dL (ref 70–99)
Potassium: 3.7 mmol/L (ref 3.5–5.1)
Sodium: 135 mmol/L (ref 135–145)
Total Bilirubin: 0.6 mg/dL (ref 0.3–1.2)
Total Protein: 7.1 g/dL (ref 6.5–8.1)

## 2020-05-08 LAB — CBC
HCT: 44.6 % (ref 36.0–46.0)
Hemoglobin: 14 g/dL (ref 12.0–15.0)
MCH: 28.8 pg (ref 26.0–34.0)
MCHC: 31.4 g/dL (ref 30.0–36.0)
MCV: 91.8 fL (ref 80.0–100.0)
Platelets: 200 10*3/uL (ref 150–400)
RBC: 4.86 MIL/uL (ref 3.87–5.11)
RDW: 13.5 % (ref 11.5–15.5)
WBC: 4.2 10*3/uL (ref 4.0–10.5)
nRBC: 0 % (ref 0.0–0.2)

## 2020-05-08 LAB — LIPASE, BLOOD: Lipase: 31 U/L (ref 11–51)

## 2020-05-08 NOTE — ED Triage Notes (Addendum)
Pt to er, daughter with pt, states that pt is really hard of hearing, states that pt felt sick to her stomach when she was at [place] yesterday, states that they went  Home and she took 1/2 a clonazepam, and her bp was 99/48, states that her bp was also low today.  Pt states that she just feels weak.  Pt moving all extremities.  MD Zammit to eval pt, negative fast.

## 2020-05-14 DIAGNOSIS — F419 Anxiety disorder, unspecified: Secondary | ICD-10-CM | POA: Diagnosis not present

## 2020-05-20 ENCOUNTER — Other Ambulatory Visit: Payer: Self-pay

## 2020-05-20 ENCOUNTER — Encounter (HOSPITAL_COMMUNITY): Payer: Self-pay | Admitting: Emergency Medicine

## 2020-05-20 ENCOUNTER — Emergency Department (HOSPITAL_COMMUNITY): Payer: Medicare Other

## 2020-05-20 ENCOUNTER — Emergency Department (HOSPITAL_COMMUNITY)
Admission: EM | Admit: 2020-05-20 | Discharge: 2020-05-20 | Disposition: A | Discharge status: Home / Self Care | Payer: Medicare Other | Attending: Emergency Medicine | Admitting: Emergency Medicine

## 2020-05-20 DIAGNOSIS — Z7982 Long term (current) use of aspirin: Secondary | ICD-10-CM | POA: Diagnosis not present

## 2020-05-20 DIAGNOSIS — E785 Hyperlipidemia, unspecified: Secondary | ICD-10-CM | POA: Diagnosis not present

## 2020-05-20 DIAGNOSIS — S52591A Other fractures of lower end of right radius, initial encounter for closed fracture: Secondary | ICD-10-CM | POA: Diagnosis not present

## 2020-05-20 DIAGNOSIS — W1839XA Other fall on same level, initial encounter: Secondary | ICD-10-CM | POA: Diagnosis not present

## 2020-05-20 DIAGNOSIS — S52352A Displaced comminuted fracture of shaft of radius, left arm, initial encounter for closed fracture: Secondary | ICD-10-CM | POA: Diagnosis not present

## 2020-05-20 DIAGNOSIS — S6991XA Unspecified injury of right wrist, hand and finger(s), initial encounter: Secondary | ICD-10-CM | POA: Diagnosis present

## 2020-05-20 DIAGNOSIS — M7989 Other specified soft tissue disorders: Secondary | ICD-10-CM | POA: Diagnosis not present

## 2020-05-20 DIAGNOSIS — I10 Essential (primary) hypertension: Secondary | ICD-10-CM | POA: Diagnosis not present

## 2020-05-20 DIAGNOSIS — R531 Weakness: Secondary | ICD-10-CM | POA: Insufficient documentation

## 2020-05-20 DIAGNOSIS — N760 Acute vaginitis: Secondary | ICD-10-CM | POA: Insufficient documentation

## 2020-05-20 DIAGNOSIS — R4182 Altered mental status, unspecified: Secondary | ICD-10-CM | POA: Insufficient documentation

## 2020-05-20 DIAGNOSIS — E441 Mild protein-calorie malnutrition: Secondary | ICD-10-CM | POA: Insufficient documentation

## 2020-05-20 DIAGNOSIS — W19XXXA Unspecified fall, initial encounter: Secondary | ICD-10-CM

## 2020-05-20 DIAGNOSIS — E871 Hypo-osmolality and hyponatremia: Secondary | ICD-10-CM | POA: Diagnosis not present

## 2020-05-20 DIAGNOSIS — S52571A Other intraarticular fracture of lower end of right radius, initial encounter for closed fracture: Secondary | ICD-10-CM | POA: Diagnosis not present

## 2020-05-20 DIAGNOSIS — Z79899 Other long term (current) drug therapy: Secondary | ICD-10-CM | POA: Diagnosis not present

## 2020-05-20 MED ORDER — OXYCODONE-ACETAMINOPHEN 5-325 MG PO TABS
1.0000 | ORAL_TABLET | Freq: Once | ORAL | Status: DC
  Filled 2020-05-20: qty 1

## 2020-05-20 MED ORDER — OXYCODONE-ACETAMINOPHEN 5-325 MG PO TABS: 1.0000 | ORAL_TABLET | Freq: Four times a day (QID) | ORAL | 0 refills | Status: DC | PRN

## 2020-05-20 NOTE — ED Provider Notes (Signed)
[facility] EMERGENCY DEPARTMENT Provider Note   CSN: [id number] Arrival date & time: 05/20/20  0524     History Chief Complaint  Patient presents with  . Fall    Amy Sawyer is a 86 y.o. female.  Patient fell today and landed on her right wrist.  Patient complains of pain in her right wrist she did not hit her head no loss consciousness  The history is provided by the patient and a relative. No language interpreter was used.  Fall This is a new problem. The current episode started 3 to 5 hours ago. The problem occurs rarely. The problem has been resolved. Pertinent negatives include no chest pain, no abdominal pain and no headaches. Nothing aggravates the symptoms. Nothing relieves the symptoms. She has tried nothing for the symptoms. The treatment provided no relief.       Past Medical History:  Diagnosis Date  . HOH (hard of hearing)   . HTN (hypertension)   . Hyperlipemia   . Stroke [facility])     Patient Active Problem List   Diagnosis Date Noted  . Vaginal itching 04/10/2020  . Mixed stress and urge urinary incontinence 04/10/2020  . Vulvovaginitis 04/10/2020  . Vaginal atrophy 04/10/2020  . Altered mental status 02/19/2018  . Hyperlipemia 02/19/2018  . Hypertension 02/19/2018  . Fever 02/19/2018  . Acute CVA (cerebrovascular accident) ([place]) 02/19/2018  . Protein-calorie malnutrition, severe 02/19/2018  . Encounter for dental exam and cleaning w/o abnormal findings 06/06/2017  . Hyponatremia 03/22/2014  . Pain in joint, shoulder region 04/09/2012  . Muscle weakness (generalized) 04/09/2012  . Proximal humerus fracture 03/13/2012    Past Surgical History:  Procedure Laterality Date  . BREAST SURGERY    . LEG SURGERY       OB History    Gravida  2   Para  2   Term  2   Preterm      AB      Living        SAB      TAB      Ectopic      Multiple      Live Births              Family History  Problem Relation Age of Onset  . Heart  disease Other   . Diabetes Other   . Stroke Mother     Social History   Tobacco Use  . Smoking status: Never Smoker  . Smokeless tobacco: Never Used  Vaping Use  . Vaping Use: Never used  Substance Use Topics  . Alcohol use: No  . Drug use: No    Home Medications Prior to Admission medications   Medication Sig Start Date End Date Taking? Authorizing Provider  sertraline (ZOLOFT) 50 MG tablet Take 50 mg by mouth daily.   Yes [provider]  amLODipine (NORVASC) 2.5 MG tablet  10/12/18   [provider]  aspirin EC 325 MG EC tablet Take 1 tablet (325 mg total) by mouth daily. 02/21/18   Johnson, Clanford L, MD  clonazePAM (KLONOPIN) 0.5 MG tablet Take 0.25 mg by mouth 2 (two) times daily as needed for anxiety.    [provider]  oxyCODONE-acetaminophen (PERCOCET/ROXICET) 5-325 MG tablet Take 1 tablet by mouth every 6 (six) hours as needed. 05/20/20   Milton Ferguson, MD  ramipril (ALTACE) 10 MG capsule Take 10 mg by mouth daily.    [provider]  simvastatin (ZOCOR) 20 MG tablet Take  20 mg by mouth daily.    [provider]    Allergies    Demerol [meperidine] and Hydrocodone-acetaminophen  Review of Systems   Review of Systems  Constitutional: Negative for appetite change and fatigue.  HENT: Negative for congestion, ear discharge and sinus pressure.   Eyes: Negative for discharge.  Respiratory: Negative for cough.   Cardiovascular: Negative for chest pain.  Gastrointestinal: Negative for abdominal pain and diarrhea.  Genitourinary: Negative for frequency and hematuria.  Musculoskeletal: Negative for back pain.       Right wrist pain  Skin: Negative for rash.  Neurological: Negative for seizures and headaches.  Psychiatric/Behavioral: Negative for hallucinations.    Physical Exam Updated Vital Signs BP (!) 170/72 (BP Location: Left Arm)   Pulse 69   Temp 98.6 F (37 C) (Oral)   Resp (!) 23   Ht 5\' 1"  (1.549 m)   Wt 57.2  kg   SpO2 100%   BMI 23.81 kg/m   Physical Exam Vitals and nursing note reviewed.  Constitutional:      Appearance: She is well-developed.  HENT:     Head: Normocephalic.     Nose: Nose normal.  Eyes:     General: No scleral icterus.    Conjunctiva/sclera: Conjunctivae normal.  Neck:     Thyroid: No thyromegaly.  Cardiovascular:     Rate and Rhythm: Normal rate and regular rhythm.     Heart sounds: No murmur heard.  No friction rub. No gallop.   Pulmonary:     Breath sounds: No stridor. No wheezing or rales.  Chest:     Chest wall: No tenderness.  Abdominal:     General: There is no distension.     Tenderness: There is no abdominal tenderness. There is no rebound.  Musculoskeletal:     Cervical back: Neck supple.     Comments: Tender swollen right wrist neurovascular exam normal  Lymphadenopathy:     Cervical: No cervical adenopathy.  Skin:    Findings: No erythema or rash.  Neurological:     Mental Status: She is oriented to person, place, and time.     Motor: No abnormal muscle tone.     Coordination: Coordination normal.  Psychiatric:        Behavior: Behavior normal.     ED Results / Procedures / Treatments   Labs (all labs ordered are listed, but only abnormal results are displayed) Labs Reviewed - No data to display  EKG None  Radiology DG Forearm Right  Result Date: 05/20/2020 CLINICAL DATA:  Recent fall with forearm and wrist pain, initial encounter EXAM: RIGHT FOREARM - 2 VIEW COMPARISON:  None. FINDINGS: Comminuted distal radial fracture is noted with impaction and posterior angulation at the fracture site. Extension to the articular surface is noted. Soft tissue swelling is noted. IMPRESSION: Comminuted distal radial fracture with articular involvement. Electronically Signed   By: Inez Catalina M.D.   On: 05/20/2020 08:24    Procedures Procedures (including critical care time)  Medications Ordered in ED Medications  oxyCODONE-acetaminophen  (PERCOCET/ROXICET) 5-325 MG per tablet 1 tablet (has no administration in time range)    ED Course  I have reviewed the triage vital signs and the nursing notes.  Pertinent labs & imaging results that were available during my care of the patient were reviewed by me and considered in my medical decision making (see chart for details).    MDM Rules/Calculators/A&P  Patient with a fracture to the right wrist.  She is placed in a sugar tong splint and referred to orthopedics and given Percocet for pain       This patient presents to the ED for concern of fall this involves an extensive number of treatment options, and is a complaint that carries with it a high risk of complications and morbidity.  The differential diagnosis includes broken wrist   Lab Tests: Medicines ordered:   I ordered medication Percocet for pain  Imaging Studies ordered:   I ordered imaging studies which included right wrist  I independently visualized and interpreted imaging which showed fracture of distal radius  Additional history obtained:   Additional history obtained from son  Previous records obtained and reviewed.  Consultations Obtained:  Patient referred to orthopedics Reevaluation:  After the interventions stated above, I reevaluated the patient and found mild improvement  Critical Interventions:  .   Final Clinical Impression(s) / ED Diagnoses Final diagnoses:  Fall, initial encounter    Rx / DC Orders ED Discharge Orders         Ordered    oxyCODONE-acetaminophen (PERCOCET/ROXICET) 5-325 MG tablet  Every 6 hours PRN        05/20/20 0937           Milton Ferguson, MD 05/20/20 [phone]

## 2020-05-20 NOTE — Discharge Instructions (Addendum)
Follow-up with Dr. Aline Brochure next week.  Keep your arm elevated.

## 2020-05-20 NOTE — ED Triage Notes (Signed)
Pt fell this morning trying to get in her chair. Pt c/o right arm pain. Pt is really hard of hearing.

## 2020-05-21 ENCOUNTER — Ambulatory Visit (INDEPENDENT_AMBULATORY_CARE_PROVIDER_SITE_OTHER): Payer: Medicare Other | Admitting: Orthopedic Surgery

## 2020-05-21 ENCOUNTER — Encounter: Payer: Self-pay | Admitting: Orthopedic Surgery

## 2020-05-21 VITALS — BP 146/71 | HR 86 | Ht 61.0 in | Wt 126.0 lb

## 2020-05-21 DIAGNOSIS — W19XXXA Unspecified fall, initial encounter: Secondary | ICD-10-CM | POA: Diagnosis not present

## 2020-05-21 DIAGNOSIS — S52531A Colles' fracture of right radius, initial encounter for closed fracture: Secondary | ICD-10-CM

## 2020-05-21 NOTE — Progress Notes (Signed)
Patient ID: Amy Sawyer, female   DOB: [birth year], 86 y.o.   MRN: [id number]  Chief Complaint  Patient presents with  . Wrist Injury    05/20/20 right wrist fracture     HPI Amy Sawyer is a 86 y.o. female.   86 year old female status post fall injuring her right wrist complains of pain swelling and ecchymosis along the skin dorsally and volarly.  Pain is well controlled with the splint  Review of Systems Review of Systems No numbness or tingling loss of motion is noted no other skin changes  Past Medical History:  Diagnosis Date  . HOH (hard of hearing)   . HTN (hypertension)   . Hyperlipemia   . Stroke [facility])     Past Surgical History:  Procedure Laterality Date  . BREAST SURGERY    . LEG SURGERY        Physical Exam 1 Blood pressure (!) 146/71, pulse 86, height 5\' 1"  (1.549 m), weight 126 lb (57.2 kg). Physical Exam 2 The patient is well developed well nourished and well groomed. 3 Orientation to person place and time is normal  4 Mood is pleasant.  5 Ambulatory status noncontributory  6 Inspection of the right wrist reveals mild to moderate tenderness   moderate swelling mild deformity 7 Range of motion assessment: The range of motion is diminished primarily secondary to pain 8 Stability tests are deferred because of pain but the x-ray shows no subluxation of the joint 9 Strength assessment muscle tone is normal resistance testing is deferred because of pain and swelling  10 Nerve function intact 11 Vascular function normal 12 Local lymphatic system negative  Opposite extremity left upper there is no alignment abnormality, no contracture, no subluxation, no atrophy and neurovascular exam is intact  MEDICAL DECISION MAKING  A. No diagnosis found.  B. DATA ANALYSED:  X-rays  IMAGING: Independent interpretation of images: My interpretation is that she has a comminuted fracture of the distal radius with minimal shortening some loss of radial  inclination it is intra-articular there is mild dorsal tilt less than 5 degrees  Orders: Short arm cast  Outside records reviewed: ER record  C. MANAGEMENT Short arm cast come back in a week mainly because she has so much ecchymosis in 1 to make sure the cast is intact and not causing any skin issues    No orders of the defined types were placed in this encounter.

## 2020-05-21 NOTE — Patient Instructions (Addendum)
Keep elevated    Cast or Splint Care, Adult Casts and splints are supports that are worn to protect broken bones and other injuries. A cast or splint may hold a bone still and in the correct position while it heals. Casts and splints may also help to ease pain, swelling, and muscle spasms. How to care for your cast   Do not stick anything inside the cast to scratch your skin.  Check the skin around the cast every day. Tell your doctor about any concerns.  You may put lotion on dry skin around the edges of the cast. Do not put lotion on the skin under the cast.  Keep the cast clean.  If the cast is not waterproof: ? Do not let it get wet. ? Cover it with a watertight covering when you take a bath or a shower. How to care for your splint   Wear it as told by your doctor. Take it off only as told by your doctor.  Loosen the splint if your fingers or toes tingle, get numb, or turn cold and blue.  Keep the splint clean.  If the splint is not waterproof: ? Do not let it get wet. ? Cover it with a watertight covering when you take a bath or a shower. Follow these instructions at home: Bathing  Do not take baths or swim until your doctor says it is okay. Ask your doctor if you can take showers. You may only be allowed to take sponge baths for bathing.  If your cast or splint is not waterproof, cover it with a watertight covering when you take a bath or shower. Managing pain, stiffness, and swelling  Move your fingers or toes often to avoid stiffness and to lessen swelling.  Raise (elevate) the injured area above the level of your heart while sitting or lying down. Safety  Do not use the injured limb to support your body weight until your doctor says that it is okay.  Use crutches or other assistive devices as told by your doctor. General instructions  Do not put pressure on any part of the cast or splint until it is fully hardened. This may take many hours.  Return to your  normal activities as told by your doctor. Ask your doctor what activities are safe for you.  Keep all follow-up visits as told by your doctor. This is important. Contact a doctor if:  Your cast or splint gets damaged.  The skin around the cast gets red or raw.  The skin under the cast is very itchy or painful.  Your cast or splint feels very uncomfortable.  Your cast or splint is too tight or too loose.  Your cast becomes wet or it starts to have a soft spot or area.  You get an object stuck under your cast. Get help right away if:  Your pain gets worse.  The injured area tingles, gets numb, or turns blue and cold.  The part of your body above or below the cast is swollen and it turns a different color (is discolored).  You cannot feel or move your fingers or toes.  There is fluid leaking through the cast.  You have very bad pain or pressure under the cast.  You have trouble breathing.  You have shortness of breath.  You have chest pain. This information is not intended to replace advice given to you by your health care provider. Make sure you discuss any questions you have with your  health care provider. Document Revised: 12/12/2018 Document Reviewed: 08/12/2016 Elsevier Patient Education  [place].

## 2020-05-28 ENCOUNTER — Ambulatory Visit (INDEPENDENT_AMBULATORY_CARE_PROVIDER_SITE_OTHER): Payer: Medicare Other | Admitting: Orthopedic Surgery

## 2020-05-28 ENCOUNTER — Encounter: Payer: Self-pay | Admitting: Orthopedic Surgery

## 2020-05-28 ENCOUNTER — Other Ambulatory Visit: Payer: Self-pay

## 2020-05-28 DIAGNOSIS — S52531D Colles' fracture of right radius, subsequent encounter for closed fracture with routine healing: Secondary | ICD-10-CM

## 2020-05-28 MED ORDER — OXYCODONE-ACETAMINOPHEN 5-325 MG PO TABS: 1.0000 | ORAL_TABLET | Freq: Four times a day (QID) | ORAL | 0 refills | Status: DC | PRN

## 2020-05-28 NOTE — Progress Notes (Signed)
Chief Complaint  Patient presents with  . Wrist Injury    check cast 05/20/20 fracture left wrist    Encounter Diagnosis  Name Primary?  . Closed Colles' fracture of right radius with routine healing, subsequent encounter Yes   Assessment and plan cast looks good skin looks good  X-rays in 5 weeks out of plaster   Intra articular fx right wrist//  Cast chaeck for skin   Skin intact

## 2020-05-29 ENCOUNTER — Other Ambulatory Visit: Payer: Self-pay | Admitting: Orthopedic Surgery

## 2020-05-29 DIAGNOSIS — S52531D Colles' fracture of right radius, subsequent encounter for closed fracture with routine healing: Secondary | ICD-10-CM

## 2020-07-02 ENCOUNTER — Other Ambulatory Visit: Payer: Self-pay

## 2020-07-02 ENCOUNTER — Encounter: Payer: Self-pay | Admitting: Orthopedic Surgery

## 2020-07-02 ENCOUNTER — Ambulatory Visit (INDEPENDENT_AMBULATORY_CARE_PROVIDER_SITE_OTHER): Payer: Medicare Other | Admitting: Orthopedic Surgery

## 2020-07-02 ENCOUNTER — Ambulatory Visit: Payer: Medicare Other

## 2020-07-02 DIAGNOSIS — S52531D Colles' fracture of right radius, subsequent encounter for closed fracture with routine healing: Secondary | ICD-10-CM

## 2020-07-02 NOTE — Progress Notes (Signed)
Fracture follow up   Encounter Diagnosis  Name Primary?  . Closed Colles' fracture of right radius with routine healing, subsequent encounter Yes   Chief Complaint  Patient presents with  . Wrist Injury    right 05/20/20 doi     Right wrist fracture   inj date : 9/15  shes been in a cast for 6 weeks   xrays OOP expected fracture shortening and deformity with prominent ulna explained to the caregiver patient is not having tenderness moving her fingers normally  PLAN:   Released follow-up as needed

## 2020-07-06 ENCOUNTER — Telehealth: Payer: Self-pay | Admitting: Orthopedic Surgery

## 2020-07-06 NOTE — Telephone Encounter (Signed)
Left message for daughter or [employer] to call me back so we can get her a splint to see if that may be helpful to her.

## 2020-07-06 NOTE — Telephone Encounter (Signed)
Patient daughter called and states her mom is still in a lot of pain. Advised her we do not have her name as DPR or Emergency contact.  She said she will get it and call us back.  Wanted to go ahead and send message to Dr. Aline Brochure.

## 2020-07-06 NOTE — Telephone Encounter (Signed)
Splint   Ok   See her no

## 2020-07-06 NOTE — Telephone Encounter (Signed)
Pain has increased out of cast  I would like to try a  velcro splint for her is that okay, or do you need to see her ?

## 2020-07-06 NOTE — Telephone Encounter (Signed)
Her daughter or son in law [employer] People will pick up the splint and put it on her, if she is not better in a couple days they will let us know  Arbie Cookey has the splint. No charges it is an extra we had left over.

## 2020-07-09 DIAGNOSIS — F419 Anxiety disorder, unspecified: Secondary | ICD-10-CM | POA: Diagnosis not present

## 2020-07-09 DIAGNOSIS — I700 Atherosclerosis of aorta: Secondary | ICD-10-CM | POA: Diagnosis not present

## 2020-07-09 DIAGNOSIS — S6291XA Unspecified fracture of right wrist and hand, initial encounter for closed fracture: Secondary | ICD-10-CM | POA: Diagnosis not present

## 2020-07-10 ENCOUNTER — Ambulatory Visit: Payer: Medicare Other | Admitting: Adult Health

## 2020-07-17 ENCOUNTER — Encounter (HOSPITAL_COMMUNITY): Payer: Self-pay | Admitting: Emergency Medicine

## 2020-07-17 ENCOUNTER — Emergency Department (HOSPITAL_COMMUNITY): Payer: Medicare Other

## 2020-07-17 ENCOUNTER — Emergency Department (HOSPITAL_COMMUNITY)
Admission: EM | Admit: 2020-07-17 | Discharge: 2020-07-17 | Disposition: A | Discharge status: Home / Self Care | Payer: Medicare Other | Attending: Emergency Medicine | Admitting: Emergency Medicine

## 2020-07-17 ENCOUNTER — Other Ambulatory Visit: Payer: Self-pay

## 2020-07-17 DIAGNOSIS — R1084 Generalized abdominal pain: Secondary | ICD-10-CM | POA: Diagnosis not present

## 2020-07-17 DIAGNOSIS — R5383 Other fatigue: Secondary | ICD-10-CM | POA: Insufficient documentation

## 2020-07-17 DIAGNOSIS — M791 Myalgia, unspecified site: Secondary | ICD-10-CM | POA: Diagnosis not present

## 2020-07-17 DIAGNOSIS — R5381 Other malaise: Secondary | ICD-10-CM | POA: Insufficient documentation

## 2020-07-17 DIAGNOSIS — K5900 Constipation, unspecified: Secondary | ICD-10-CM

## 2020-07-17 DIAGNOSIS — R1031 Right lower quadrant pain: Secondary | ICD-10-CM | POA: Diagnosis not present

## 2020-07-17 DIAGNOSIS — I10 Essential (primary) hypertension: Secondary | ICD-10-CM | POA: Insufficient documentation

## 2020-07-17 DIAGNOSIS — R1030 Lower abdominal pain, unspecified: Secondary | ICD-10-CM | POA: Diagnosis not present

## 2020-07-17 DIAGNOSIS — Z79899 Other long term (current) drug therapy: Secondary | ICD-10-CM | POA: Diagnosis not present

## 2020-07-17 DIAGNOSIS — Z7982 Long term (current) use of aspirin: Secondary | ICD-10-CM | POA: Diagnosis not present

## 2020-07-17 DIAGNOSIS — R410 Disorientation, unspecified: Secondary | ICD-10-CM | POA: Insufficient documentation

## 2020-07-17 DIAGNOSIS — G319 Degenerative disease of nervous system, unspecified: Secondary | ICD-10-CM | POA: Diagnosis not present

## 2020-07-17 DIAGNOSIS — R531 Weakness: Secondary | ICD-10-CM | POA: Diagnosis not present

## 2020-07-17 DIAGNOSIS — R634 Abnormal weight loss: Secondary | ICD-10-CM | POA: Insufficient documentation

## 2020-07-17 DIAGNOSIS — I6782 Cerebral ischemia: Secondary | ICD-10-CM | POA: Diagnosis not present

## 2020-07-17 LAB — URINALYSIS, ROUTINE W REFLEX MICROSCOPIC
Bilirubin Urine: NEGATIVE
Glucose, UA: NEGATIVE mg/dL
Ketones, ur: 5 mg/dL — AB
Nitrite: NEGATIVE
Protein, ur: NEGATIVE mg/dL
Specific Gravity, Urine: 1.004 — ABNORMAL LOW (ref 1.005–1.030)
pH: 7 (ref 5.0–8.0)

## 2020-07-17 LAB — CBC
HCT: 44.4 % (ref 36.0–46.0)
Hemoglobin: 14.2 g/dL (ref 12.0–15.0)
MCH: 29.2 pg (ref 26.0–34.0)
MCHC: 32 g/dL (ref 30.0–36.0)
MCV: 91.2 fL (ref 80.0–100.0)
Platelets: 198 10*3/uL (ref 150–400)
RBC: 4.87 MIL/uL (ref 3.87–5.11)
RDW: 14.3 % (ref 11.5–15.5)
WBC: 4.2 10*3/uL (ref 4.0–10.5)
nRBC: 0 % (ref 0.0–0.2)

## 2020-07-17 LAB — COMPREHENSIVE METABOLIC PANEL
ALT: 10 U/L (ref 0–44)
AST: 16 U/L (ref 15–41)
Albumin: 3.5 g/dL (ref 3.5–5.0)
Alkaline Phosphatase: 50 U/L (ref 38–126)
Anion gap: 14 (ref 5–15)
BUN: 6 mg/dL — ABNORMAL LOW (ref 8–23)
CO2: 25 mmol/L (ref 22–32)
Calcium: 9.1 mg/dL (ref 8.9–10.3)
Chloride: 99 mmol/L (ref 98–111)
Creatinine, Ser: 0.86 mg/dL (ref 0.44–1.00)
GFR, Estimated: >60 mL/min (ref >60)
Glucose, Bld: 98 mg/dL (ref 70–99)
Potassium: 3.1 mmol/L — ABNORMAL LOW (ref 3.5–5.1)
Sodium: 138 mmol/L (ref 135–145)
Total Bilirubin: 1 mg/dL (ref 0.3–1.2)
Total Protein: 6.3 g/dL — ABNORMAL LOW (ref 6.5–8.1)

## 2020-07-17 LAB — LIPASE, BLOOD: Lipase: 25 U/L (ref 11–51)

## 2020-07-17 MED ORDER — POLYETHYLENE GLYCOL 3350 17 G PO PACK
17.0000 g | PACK | Freq: Every day | ORAL | 0 refills | Status: DC
Start: 2020-07-17 — End: 2022-07-07

## 2020-07-17 MED ORDER — IOHEXOL 300 MG/ML  SOLN
100.0000 mL | Freq: Once | INTRAMUSCULAR | Status: AC | PRN
  Administered 2020-07-17: 100 mL via INTRAVENOUS

## 2020-07-17 MED ORDER — SENNOSIDES-DOCUSATE SODIUM 8.6-50 MG PO TABS: 1.0000 | ORAL_TABLET | Freq: Two times a day (BID) | ORAL | 0 refills | Status: AC | PRN

## 2020-07-17 MED ORDER — SODIUM CHLORIDE 0.9 % IV BOLUS
1000.0000 mL | Freq: Once | INTRAVENOUS | Status: AC
  Administered 2020-07-17: 1000 mL via INTRAVENOUS

## 2020-07-17 NOTE — Discharge Planning (Signed)
RNCM has recommended that this patient have [facility] but pt declines at this time. I have discussed the benefits of home health services as wll as the risks of not having home health services with pt. RNCM informed patient that if she changed her mind, her primary care physician can order home health from office.The patient verbalizes understanding. No further RNCM needs identified at this time.

## 2020-07-17 NOTE — ED Notes (Signed)
Pure Wick was placed on patient. 

## 2020-07-17 NOTE — ED Triage Notes (Signed)
Pt severely hard of hearing, pts daughter reports that upon seeing pt today, pt reports that she is having body aches, lower abd pain and lower back pain. She denies urinary symptoms. Reports recent covid shot and states her arm Is still very sore from that. She denies fever.

## 2020-07-17 NOTE — ED Provider Notes (Signed)
  Physical Exam  BP (!) 182/97   Pulse 72   Temp 98.3 F (36.8 C) (Oral)   Resp 19   SpO2 93%   Physical Exam  ED Course/Procedures     Procedures  MDM  Received care from Dr. Roslynn Amble. Please see his note for prior history, physical and care. CT head and CT abdomen pending at time of transfer of care.   CT abdomen pelvis shows constipation. CT head WNL. Discussed results. Recommend follow up with PCP.  Patient discharged in stable condition with understanding of reasons to return.        Gareth Morgan, MD 07/18/20 2311

## 2020-07-17 NOTE — ED Provider Notes (Signed)
[place] EMERGENCY DEPARTMENT Provider Note   CSN: [id number] Arrival date & time: 07/17/20  1039     History Chief Complaint  Patient presents with  . Generalized Body Aches  . Abdominal Pain    Amy Sawyer is a 86 y.o. female.  Presents to ER with concern for generalized fatigue, malaise, body aches, abdominal pain.  Patient reports that she has had significant overall decline since she broke her arm a couple months ago.  Has had decreased energy, decreased appetite.  Daughter provides additional history, states that she has lost a few pounds, has decreased overall activity level.  More recently, patient has been having body aches ever since receiving her Covid vaccine few days ago.  Also reports that patient has been complaining of some lower abdominal pain.  Patient states lower, dull achy crampy pain.  History limited due to patient's extreme hard of hearing status.  Additional history from daughter.  HPI     Past Medical History:  Diagnosis Date  . HOH (hard of hearing)   . HTN (hypertension)   . Hyperlipemia   . Stroke [facility])     Patient Active Problem List   Diagnosis Date Noted  . Vaginal itching 04/10/2020  . Mixed stress and urge urinary incontinence 04/10/2020  . Vulvovaginitis 04/10/2020  . Vaginal atrophy 04/10/2020  . Altered mental status 02/19/2018  . Hyperlipemia 02/19/2018  . Hypertension 02/19/2018  . Fever 02/19/2018  . Acute CVA (cerebrovascular accident) ([place]) 02/19/2018  . Protein-calorie malnutrition, severe 02/19/2018  . Encounter for dental exam and cleaning w/o abnormal findings 06/06/2017  . Hyponatremia 03/22/2014  . Pain in joint, shoulder region 04/09/2012  . Muscle weakness (generalized) 04/09/2012  . Proximal humerus fracture 03/13/2012    Past Surgical History:  Procedure Laterality Date  . BREAST SURGERY    . LEG SURGERY       OB History    Gravida  2   Para  2   Term  2   Preterm      AB       Living        SAB      TAB      Ectopic      Multiple      Live Births              Family History  Problem Relation Age of Onset  . Heart disease Other   . Diabetes Other   . Stroke Mother     Social History   Tobacco Use  . Smoking status: Never Smoker  . Smokeless tobacco: Never Used  Vaping Use  . Vaping Use: Never used  Substance Use Topics  . Alcohol use: No  . Drug use: No    Home Medications Prior to Admission medications   Medication Sig Start Date End Date Taking? Authorizing Provider  amLODipine (NORVASC) 2.5 MG tablet  10/12/18  Yes [provider]  ramipril (ALTACE) 10 MG capsule Take 10 mg by mouth daily.   Yes [provider]  aspirin EC 325 MG EC tablet Take 1 tablet (325 mg total) by mouth daily. 02/21/18   Johnson, Clanford L, MD  clonazePAM (KLONOPIN) 0.5 MG tablet Take 0.25 mg by mouth 2 (two) times daily as needed for anxiety.    [provider]  oxyCODONE-acetaminophen (PERCOCET/ROXICET) 5-325 MG tablet Take 1 tablet by mouth every 6 (six) hours as needed. 05/28/20   Carole Civil, MD  sertraline (ZOLOFT) 50  MG tablet Take 50 mg by mouth daily.    [provider]  simvastatin (ZOCOR) 20 MG tablet Take 20 mg by mouth daily.    [provider]    Allergies    Demerol [meperidine] and Hydrocodone-acetaminophen  Review of Systems   Review of Systems  Constitutional: Negative for chills and fever.  HENT: Negative for ear pain and sore throat.   Eyes: Negative for pain and visual disturbance.  Respiratory: Negative for cough and shortness of breath.   Cardiovascular: Negative for chest pain and palpitations.  Gastrointestinal: Negative for abdominal pain and vomiting.  Genitourinary: Negative for dysuria and hematuria.  Musculoskeletal: Negative for arthralgias and back pain.  Skin: Positive for color change and rash.  Neurological: Negative for seizures and syncope.  All other systems  reviewed and are negative.   Physical Exam Updated Vital Signs BP (!) 164/75   Pulse 69   Temp 98.3 F (36.8 C) (Oral)   Resp (!) 22   SpO2 96%   Physical Exam Vitals and nursing note reviewed.  Constitutional:      General: She is not in acute distress.    Appearance: She is well-developed.     Comments: Elderly, frail  HENT:     Head: Normocephalic and atraumatic.  Eyes:     Conjunctiva/sclera: Conjunctivae normal.  Cardiovascular:     Rate and Rhythm: Normal rate and regular rhythm.     Heart sounds: No murmur heard.   Pulmonary:     Effort: Pulmonary effort is normal. No respiratory distress.     Breath sounds: Normal breath sounds.  Abdominal:     General: Abdomen is flat. Bowel sounds are normal.     Palpations: Abdomen is soft.     Tenderness: There is abdominal tenderness in the suprapubic area.     Hernia: No hernia is present.  Musculoskeletal:     Cervical back: Neck supple.  Skin:    General: Skin is warm and dry.  Neurological:     Mental Status: She is alert.     ED Results / Procedures / Treatments   Labs (all labs ordered are listed, but only abnormal results are displayed) Labs Reviewed  COMPREHENSIVE METABOLIC PANEL - Abnormal; Notable for the following components:      Result Value   Potassium 3.1 (*)    BUN 6 (*)    Total Protein 6.3 (*)    All other components within normal limits  URINALYSIS, ROUTINE W REFLEX MICROSCOPIC - Abnormal; Notable for the following components:   Specific Gravity, Urine 1.004 (*)    Hgb urine dipstick MODERATE (*)    Ketones, ur 5 (*)    Leukocytes,Ua SMALL (*)    Bacteria, UA RARE (*)    All other components within normal limits  LIPASE, BLOOD  CBC    EKG None  Radiology DG Chest Portable 1 View  Result Date: 07/17/2020 CLINICAL DATA:  Weakness EXAM: PORTABLE CHEST 1 VIEW COMPARISON:  02/19/2018 FINDINGS: Cardiac shadow is enlarged but stable. Aortic calcifications are noted. Lungs are well aerated  bilaterally. No focal infiltrate or sizable effusion is seen. No bony abnormality is noted. IMPRESSION: No acute abnormality seen. Electronically Signed   By: Inez Catalina M.D.   On: 07/17/2020 14:05    Procedures Procedures (including critical care time)  Medications Ordered in ED Medications  sodium chloride 0.9 % bolus 1,000 mL (0 mLs Intravenous Stopped 07/17/20 1509)    ED Course  I have reviewed  the triage vital signs and the nursing notes.  Pertinent labs & imaging results that were available during my care of the patient were reviewed by me and considered in my medical decision making (see chart for details).    MDM Rules/Calculators/A&P                         86 year old lady presenting to the emergency room with concern for generalized fatigue, malaise, body aches as well as abdominal pain.  On exam patient is well-appearing in no distress.  Did note some tenderness across lower abdomen.  Her labs were grossly within normal limits.    While awaiting UA, CT scan to further assess, patient signed out to Dr. Billy Fischer.  Final Clinical Impression(s) / ED Diagnoses Final diagnoses:  Malaise  Generalized abdominal pain  Weight loss    Rx / DC Orders ED Discharge Orders    None       Lucrezia Starch, MD 07/17/20 [phone]

## 2020-07-22 DIAGNOSIS — I10 Essential (primary) hypertension: Secondary | ICD-10-CM | POA: Diagnosis not present

## 2020-07-22 DIAGNOSIS — Z79899 Other long term (current) drug therapy: Secondary | ICD-10-CM | POA: Diagnosis not present

## 2020-07-22 DIAGNOSIS — E785 Hyperlipidemia, unspecified: Secondary | ICD-10-CM | POA: Diagnosis not present

## 2020-07-22 DIAGNOSIS — G4700 Insomnia, unspecified: Secondary | ICD-10-CM | POA: Diagnosis not present

## 2020-07-22 DIAGNOSIS — F419 Anxiety disorder, unspecified: Secondary | ICD-10-CM | POA: Diagnosis not present

## 2020-08-03 ENCOUNTER — Ambulatory Visit
Admission: EM | Admit: 2020-08-03 | Discharge: 2020-08-03 | Disposition: A | Discharge status: Home / Self Care | Payer: Medicare Other | Attending: Emergency Medicine | Admitting: Emergency Medicine

## 2020-08-03 ENCOUNTER — Other Ambulatory Visit: Payer: Self-pay

## 2020-08-03 DIAGNOSIS — N390 Urinary tract infection, site not specified: Secondary | ICD-10-CM

## 2020-08-03 DIAGNOSIS — R1030 Lower abdominal pain, unspecified: Secondary | ICD-10-CM | POA: Diagnosis not present

## 2020-08-03 LAB — POCT URINALYSIS DIP (MANUAL ENTRY)
Bilirubin, UA: NEGATIVE
Glucose, UA: NEGATIVE mg/dL
Ketones, POC UA: NEGATIVE mg/dL
Nitrite, UA: POSITIVE — AB
Protein Ur, POC: NEGATIVE mg/dL
Spec Grav, UA: 1.015 (ref 1.010–1.025)
Urobilinogen, UA: 0.2 E.U./dL
pH, UA: 8.5 — AB (ref 5.0–8.0)

## 2020-08-03 MED ORDER — CIPROFLOXACIN HCL 250 MG PO TABS: 250.0000 mg | ORAL_TABLET | Freq: Two times a day (BID) | ORAL | 0 refills | Status: AC

## 2020-08-03 NOTE — ED Provider Notes (Signed)
[facility]   Chief Complaint  Patient presents with  . Abdominal Pain     SUBJECTIVE:  Amy Sawyer is a 86 y.o. female who presented to the urgent care for complaint of lower abdominal pain for the past 4 days.  Patient denies a precipitating event, recent sexual encounter, excessive caffeine intake.  Localizes the pain to the lower abdomen.  Pain is constant and describes it as achy.  Has tried OTC medications without relief.  Symptoms are made worse with urination.  Admits to similar symptoms in the past.  Denies fever, chills, nausea, vomiting, abdominal pain, abnormal vaginal discharge or bleeding, hematuria.    LMP: No LMP recorded. Patient is postmenopausal.  ROS: As in HPI.  All other pertinent ROS negative.     Past Medical History:  Diagnosis Date  . HOH (hard of hearing)   . HTN (hypertension)   . Hyperlipemia   . Stroke [facility])    Past Surgical History:  Procedure Laterality Date  . BREAST SURGERY    . LEG SURGERY     Allergies  Allergen Reactions  . Demerol [Meperidine] Anaphylaxis  . Hydrocodone-Acetaminophen Nausea And Vomiting   No current facility-administered medications on file prior to encounter.   Current Outpatient Medications on File Prior to Encounter  Medication Sig Dispense Refill  . acetaminophen (TYLENOL) 500 MG tablet Take 500 mg by mouth every 6 (six) hours as needed for mild pain or headache.    Marland Kitchen amLODipine (NORVASC) 2.5 MG tablet Take 2.5 mg by mouth daily.     Marland Kitchen amLODipine (NORVASC) 5 MG tablet Take 5 mg by mouth daily.    Marland Kitchen aspirin EC 325 MG EC tablet Take 1 tablet (325 mg total) by mouth daily. 30 tablet 0  . clonazePAM (KLONOPIN) 0.5 MG tablet Take 0.25 mg by mouth 2 (two) times daily as needed for anxiety.    Marland Kitchen oxyCODONE-acetaminophen (PERCOCET/ROXICET) 5-325 MG tablet Take 1 tablet by mouth every 6 (six) hours as needed. (Patient taking differently: Take 1 tablet by mouth every 6 (six) hours as needed for moderate pain or  severe pain. ) 20 tablet 0  . polyethylene glycol (MIRALAX) 17 g packet Take 17 g by mouth daily. 14 each 0  . ramipril (ALTACE) 10 MG capsule Take 10 mg by mouth daily.    . sertraline (ZOLOFT) 50 MG tablet Take 50 mg by mouth daily.    . simvastatin (ZOCOR) 20 MG tablet Take 20 mg by mouth daily.    Marland Kitchen trolamine salicylate (ASPERCREME) 10 % cream Apply 1 application topically 2 (two) times daily as needed for muscle pain.     Social History   Socioeconomic History  . Marital status: Married    Spouse name: Not on file  . Number of children: Not on file  . Years of education: 64  . Highest education level: Not on file  Occupational History  . Not on file  Tobacco Use  . Smoking status: Never Smoker  . Smokeless tobacco: Never Used  Vaping Use  . Vaping Use: Never used  Substance and Sexual Activity  . Alcohol use: No  . Drug use: No  . Sexual activity: Not Currently    Birth control/protection: Post-menopausal  Other Topics Concern  . Not on file  Social History Narrative  . Not on file   Social Determinants of Health   Financial Resource Strain:   . Difficulty of Paying Living Expenses: Not on file  Food Insecurity: No Food  Insecurity  . Worried About Charity fundraiser in the Last Year: Never true  . Ran Out of Food in the Last Year: Never true  Transportation Needs: No Transportation Needs  . Lack of Transportation (Medical): No  . Lack of Transportation (Non-Medical): No  Physical Activity: Inactive  . Days of Exercise per Week: 0 days  . Minutes of Exercise per Session: 20 min  Stress: No Stress Concern Present  . Feeling of Stress : Only a little  Social Connections: Moderately Isolated  . Frequency of Communication with Friends and Family: Twice a week  . Frequency of Social Gatherings with Friends and Family: Twice a week  . Attends Religious Services: Never  . Active Member of Clubs or Organizations: No  . Attends Archivist Meetings: Never  .  Marital Status: Married  Human resources officer Violence: Not At Risk  . Fear of Current or Ex-Partner: No  . Emotionally Abused: No  . Physically Abused: No  . Sexually Abused: No   Family History  Problem Relation Age of Onset  . Heart disease Other   . Diabetes Other   . Stroke Mother     OBJECTIVE:  Vitals:   08/03/20 1421  BP: (!) 189/79  Pulse: 81  Resp: 20  Temp: 97.6 F (36.4 C)  SpO2: 95%   General appearance: AOx3 in no acute distress HEENT: NCAT.  Oropharynx clear.  Lungs: clear to auscultation bilaterally without adventitious breath sounds Heart: regular rate and rhythm.  Radial pulses 2+ symmetrical bilaterally Abdomen: soft; non-distended; no tenderness; bowel sounds present; no guarding or rebound tenderness Back: no CVA tenderness Extremities: no edema; symmetrical with no gross deformities Skin: warm and dry Neurologic: Ambulates from chair to exam table without difficulty Psychological: alert and cooperative; normal mood and affect  Labs Reviewed  POCT URINALYSIS DIP (MANUAL ENTRY) - Abnormal; Notable for the following components:      Result Value   Blood, UA small (*)    pH, UA 8.5 (*)    Nitrite, UA Positive (*)    Leukocytes, UA Small (1+) (*)    All other components within normal limits  URINE CULTURE    ASSESSMENT & PLAN:  1. Acute lower UTI   2. Lower abdominal pain     Meds ordered this encounter  Medications  . ciprofloxacin (CIPRO) 250 MG tablet    Sig: Take 1 tablet (250 mg total) by mouth 2 (two) times daily for 3 days.    Dispense:  6 tablet    Refill:  0   Discharge instructions   Push fluids and get plenty of rest.   Take antibiotic as directed and to completion Follow up with PCP if symptoms persists Return here or go to ER if you have any new or worsening symptoms such as fever, worsening abdominal pain, nausea/vomiting, flank pain, etc...  Outlined signs and symptoms indicating need for more acute intervention. Patient  verbalized understanding. After Visit Summary given.     Emerson Monte, FNP 08/03/20 1502

## 2020-08-03 NOTE — ED Triage Notes (Signed)
Pt presents with c/o lower abdominal pain  That has been on going since visit to ed on 11/12. Pt reports BM 4 days ago

## 2020-08-03 NOTE — Discharge Instructions (Addendum)
Push fluids and get plenty of rest.   Take antibiotic as directed and to completion Follow up with PCP if symptoms persists Return here or go to ER if you have any new or worsening symptoms such as fever, worsening abdominal pain, nausea/vomiting, flank pain, etc..Marland Kitchen

## 2020-08-18 DIAGNOSIS — Z23 Encounter for immunization: Secondary | ICD-10-CM | POA: Diagnosis not present

## 2020-10-06 DIAGNOSIS — Z0000 Encounter for general adult medical examination without abnormal findings: Secondary | ICD-10-CM | POA: Diagnosis not present

## 2020-10-27 DIAGNOSIS — Z79899 Other long term (current) drug therapy: Secondary | ICD-10-CM | POA: Diagnosis not present

## 2020-10-27 DIAGNOSIS — F419 Anxiety disorder, unspecified: Secondary | ICD-10-CM | POA: Diagnosis not present

## 2020-10-27 DIAGNOSIS — E785 Hyperlipidemia, unspecified: Secondary | ICD-10-CM | POA: Diagnosis not present

## 2020-10-27 DIAGNOSIS — I6781 Acute cerebrovascular insufficiency: Secondary | ICD-10-CM | POA: Diagnosis not present

## 2020-10-27 DIAGNOSIS — I700 Atherosclerosis of aorta: Secondary | ICD-10-CM | POA: Diagnosis not present

## 2020-11-04 DIAGNOSIS — F419 Anxiety disorder, unspecified: Secondary | ICD-10-CM | POA: Diagnosis not present

## 2020-11-04 DIAGNOSIS — I10 Essential (primary) hypertension: Secondary | ICD-10-CM | POA: Diagnosis not present

## 2020-11-04 DIAGNOSIS — I700 Atherosclerosis of aorta: Secondary | ICD-10-CM | POA: Diagnosis not present

## 2021-01-18 ENCOUNTER — Emergency Department (HOSPITAL_COMMUNITY): Payer: Medicare Other

## 2021-01-18 ENCOUNTER — Other Ambulatory Visit: Payer: Self-pay

## 2021-01-18 ENCOUNTER — Inpatient Hospital Stay (HOSPITAL_COMMUNITY)
Admission: EM | Admit: 2021-01-18 | Discharge: 2021-01-22 | DRG: 480 | Disposition: A | Discharge status: Skilled Nursing Facility | Payer: Medicare Other | Attending: Internal Medicine | Admitting: Internal Medicine

## 2021-01-18 DIAGNOSIS — M25552 Pain in left hip: Secondary | ICD-10-CM | POA: Diagnosis not present

## 2021-01-18 DIAGNOSIS — S72009A Fracture of unspecified part of neck of unspecified femur, initial encounter for closed fracture: Secondary | ICD-10-CM | POA: Diagnosis not present

## 2021-01-18 DIAGNOSIS — Z885 Allergy status to narcotic agent status: Secondary | ICD-10-CM

## 2021-01-18 DIAGNOSIS — W19XXXA Unspecified fall, initial encounter: Secondary | ICD-10-CM | POA: Diagnosis not present

## 2021-01-18 DIAGNOSIS — I119 Hypertensive heart disease without heart failure: Secondary | ICD-10-CM | POA: Diagnosis present

## 2021-01-18 DIAGNOSIS — E43 Unspecified severe protein-calorie malnutrition: Secondary | ICD-10-CM | POA: Diagnosis present

## 2021-01-18 DIAGNOSIS — D649 Anemia, unspecified: Secondary | ICD-10-CM | POA: Diagnosis present

## 2021-01-18 DIAGNOSIS — E785 Hyperlipidemia, unspecified: Secondary | ICD-10-CM | POA: Diagnosis present

## 2021-01-18 DIAGNOSIS — S72142A Displaced intertrochanteric fracture of left femur, initial encounter for closed fracture: Secondary | ICD-10-CM | POA: Diagnosis not present

## 2021-01-18 DIAGNOSIS — Z66 Do not resuscitate: Secondary | ICD-10-CM | POA: Diagnosis not present

## 2021-01-18 DIAGNOSIS — M25562 Pain in left knee: Secondary | ICD-10-CM | POA: Diagnosis present

## 2021-01-18 DIAGNOSIS — R404 Transient alteration of awareness: Secondary | ICD-10-CM | POA: Diagnosis not present

## 2021-01-18 DIAGNOSIS — Z20822 Contact with and (suspected) exposure to covid-19: Secondary | ICD-10-CM | POA: Diagnosis not present

## 2021-01-18 DIAGNOSIS — E871 Hypo-osmolality and hyponatremia: Secondary | ICD-10-CM | POA: Diagnosis not present

## 2021-01-18 DIAGNOSIS — Z7982 Long term (current) use of aspirin: Secondary | ICD-10-CM

## 2021-01-18 DIAGNOSIS — Z79899 Other long term (current) drug therapy: Secondary | ICD-10-CM

## 2021-01-18 DIAGNOSIS — S80919A Unspecified superficial injury of unspecified knee, initial encounter: Secondary | ICD-10-CM | POA: Diagnosis not present

## 2021-01-18 DIAGNOSIS — H9190 Unspecified hearing loss, unspecified ear: Secondary | ICD-10-CM | POA: Diagnosis present

## 2021-01-18 DIAGNOSIS — W010XXA Fall on same level from slipping, tripping and stumbling without subsequent striking against object, initial encounter: Secondary | ICD-10-CM | POA: Diagnosis present

## 2021-01-18 DIAGNOSIS — I10 Essential (primary) hypertension: Secondary | ICD-10-CM | POA: Diagnosis not present

## 2021-01-18 DIAGNOSIS — M25462 Effusion, left knee: Secondary | ICD-10-CM | POA: Diagnosis not present

## 2021-01-18 DIAGNOSIS — Z681 Body mass index (BMI) 19 or less, adult: Secondary | ICD-10-CM | POA: Diagnosis not present

## 2021-01-18 DIAGNOSIS — Z823 Family history of stroke: Secondary | ICD-10-CM

## 2021-01-18 DIAGNOSIS — S72002A Fracture of unspecified part of neck of left femur, initial encounter for closed fracture: Secondary | ICD-10-CM

## 2021-01-18 DIAGNOSIS — M1712 Unilateral primary osteoarthritis, left knee: Secondary | ICD-10-CM | POA: Diagnosis not present

## 2021-01-18 DIAGNOSIS — Z8673 Personal history of transient ischemic attack (TIA), and cerebral infarction without residual deficits: Secondary | ICD-10-CM

## 2021-01-18 DIAGNOSIS — F419 Anxiety disorder, unspecified: Secondary | ICD-10-CM | POA: Diagnosis present

## 2021-01-18 MED ORDER — FENTANYL CITRATE (PF) 100 MCG/2ML IJ SOLN
50.0000 ug | Freq: Once | INTRAMUSCULAR | Status: AC
  Administered 2021-01-18: 50 ug via INTRAVENOUS
  Filled 2021-01-18: qty 2

## 2021-01-18 NOTE — ED Provider Notes (Signed)
[facility] EMERGENCY DEPARTMENT Provider Note   CSN: [id number] Arrival date & time: 01/18/21  1949     History Chief Complaint  Patient presents with  . Fall    Amy Sawyer is a 87 y.o. female.  HPI Patient was at home with her husband when she was walking in the kitchen without her walker, and fell against a refrigerator.  Patient's husband called the patient's daughter who lives adjacent and came quickly to the patient's home.  The daughter found the patient sitting against the commode in an upright position, with her legs out in front of her.  She seemed to have pain in her left knee and was transferred here by EMS for evaluation of that.  There has been no notable injury to her head or change in behavior according to the patient's daughter who is with her and gives history.  Patient is severely hard of hearing and all history is given by the daughter.  Level 5 caveat-high acuity    Past Medical History:  Diagnosis Date  . HOH (hard of hearing)   . HTN (hypertension)   . Hyperlipemia   . Stroke [facility])     Patient Active Problem List   Diagnosis Date Noted  . Vaginal itching 04/10/2020  . Mixed stress and urge urinary incontinence 04/10/2020  . Vulvovaginitis 04/10/2020  . Vaginal atrophy 04/10/2020  . Altered mental status 02/19/2018  . Hyperlipemia 02/19/2018  . Hypertension 02/19/2018  . Fever 02/19/2018  . Acute CVA (cerebrovascular accident) ([place]) 02/19/2018  . Protein-calorie malnutrition, severe 02/19/2018  . Encounter for dental exam and cleaning w/o abnormal findings 06/06/2017  . Hyponatremia 03/22/2014  . Pain in joint, shoulder region 04/09/2012  . Muscle weakness (generalized) 04/09/2012  . Proximal humerus fracture 03/13/2012    Past Surgical History:  Procedure Laterality Date  . BREAST SURGERY    . LEG SURGERY       OB History    Gravida  2   Para  2   Term  2   Preterm      AB      Living        SAB      IAB      Ectopic       Multiple      Live Births              Family History  Problem Relation Age of Onset  . Heart disease Other   . Diabetes Other   . Stroke Mother     Social History   Tobacco Use  . Smoking status: Never Smoker  . Smokeless tobacco: Never Used  Vaping Use  . Vaping Use: Never used  Substance Use Topics  . Alcohol use: No  . Drug use: No    Home Medications Prior to Admission medications   Medication Sig Start Date End Date Taking? Authorizing Provider  acetaminophen (TYLENOL) 500 MG tablet Take 500 mg by mouth every 6 (six) hours as needed for mild pain or headache.    [provider]  amLODipine (NORVASC) 2.5 MG tablet Take 2.5 mg by mouth daily.  10/12/18   [provider]  amLODipine (NORVASC) 5 MG tablet Take 5 mg by mouth daily.    [provider]  aspirin EC 325 MG EC tablet Take 1 tablet (325 mg total) by mouth daily. 02/21/18   Johnson, Clanford L, MD  clonazePAM (KLONOPIN) 0.5 MG tablet Take 0.25 mg by mouth 2 (two) times  daily as needed for anxiety.    [provider]  oxyCODONE-acetaminophen (PERCOCET/ROXICET) 5-325 MG tablet Take 1 tablet by mouth every 6 (six) hours as needed. Patient taking differently: Take 1 tablet by mouth every 6 (six) hours as needed for moderate pain or severe pain.  05/28/20   Carole Civil, MD  polyethylene glycol (MIRALAX) 17 g packet Take 17 g by mouth daily. 07/17/20   Gareth Morgan, MD  ramipril (ALTACE) 10 MG capsule Take 10 mg by mouth daily.    [provider]  sertraline (ZOLOFT) 50 MG tablet Take 50 mg by mouth daily.    [provider]  simvastatin (ZOCOR) 20 MG tablet Take 20 mg by mouth daily.    [provider]  trolamine salicylate (ASPERCREME) 10 % cream Apply 1 application topically 2 (two) times daily as needed for muscle pain.    [provider]    Allergies    Demerol [meperidine] and Hydrocodone-acetaminophen  Review of Systems    Review of Systems  Unable to perform ROS: Other    Physical Exam Updated Vital Signs BP 124/66   Pulse 77   Temp 98.5 F (36.9 C) (Oral)   Resp (!) 23   SpO2 95%   Physical Exam Vitals and nursing note reviewed.  Constitutional:      General: She is in acute distress (Uncomfortable).     Appearance: She is well-developed. She is ill-appearing. She is not toxic-appearing or diaphoretic.  HENT:     Head: Normocephalic and atraumatic.     Right Ear: External ear normal.     Left Ear: External ear normal.  Eyes:     Conjunctiva/sclera: Conjunctivae normal.     Pupils: Pupils are equal, round, and reactive to light.  Neck:     Trachea: Phonation normal.  Cardiovascular:     Rate and Rhythm: Normal rate and regular rhythm.     Heart sounds: Normal heart sounds.  Pulmonary:     Effort: Pulmonary effort is normal. No respiratory distress.     Breath sounds: Normal breath sounds. No stridor.  Chest:     Chest wall: No tenderness.  Abdominal:     General: There is no distension.     Palpations: Abdomen is soft.     Tenderness: There is no abdominal tenderness.  Musculoskeletal:     Cervical back: Normal range of motion. No tenderness.     Comments: She guards against moving the left hip and left knee secondary to pain.  Left hip is externally rotated, and the left leg appears shortened.  Neurovascular intact distally in the left foot.  No deformity of arms or right leg.  Skin:    General: Skin is warm and dry.     Coloration: Skin is not jaundiced or pale.  Neurological:     Mental Status: She is alert.     Cranial Nerves: No cranial nerve deficit.     Sensory: No sensory deficit.     Motor: No abnormal muscle tone.     Coordination: Coordination normal.  Psychiatric:        Mood and Affect: Mood normal.        Behavior: Behavior normal.     ED Results / Procedures / Treatments   Labs (all labs ordered are listed, but only abnormal results are displayed) Labs  Reviewed  CBC WITH DIFFERENTIAL/PLATELET - Abnormal; Notable for the following components:      Result Value   Neutro Abs 8.4 (*)  Lymphs Abs 0.6 (*)    All other components within normal limits  SARS CORONAVIRUS 2 (TAT 6-24 HRS)  BASIC METABOLIC PANEL  URINALYSIS, ROUTINE W REFLEX MICROSCOPIC  TYPE AND SCREEN    EKG None  Radiology DG Knee Complete 4 Views Left  Result Date: 01/18/2021 CLINICAL DATA:  Status post fall. EXAM: LEFT KNEE - COMPLETE 4+ VIEW COMPARISON:  May 22, 2008 FINDINGS: No evidence of an acute fracture or dislocation. Moderate severity medial and lateral tibiofemoral compartment space narrowing is seen. Mild vascular calcification is noted. A very small joint effusion is seen. IMPRESSION: 1. Degenerative changes without an acute osseous abnormality. 2. Very small joint effusion. Electronically Signed   By: Virgina Norfolk M.D.   On: 01/18/2021 22:41    Procedures .Critical Care Performed by: Daleen Bo, MD Authorized by: Daleen Bo, MD   Critical care provider statement:    Critical care time (minutes):  35   Critical care start time:  01/18/2021 12:45 AM   Critical care end time:  01/19/2021 12:45 AM   Critical care time was exclusive of:  Separately billable procedures and treating other patients   Critical care was necessary to treat or prevent imminent or life-threatening deterioration of the following conditions:  Trauma   Critical care was time spent personally by me on the following activities:  Blood draw for specimens, development of treatment plan with patient or surrogate, discussions with consultants, evaluation of patient's response to treatment, examination of patient, obtaining history from patient or surrogate, ordering and performing treatments and interventions, ordering and review of laboratory studies, pulse oximetry, re-evaluation of patient's condition, review of old charts and ordering and review of radiographic studies      Medications Ordered in ED Medications  fentaNYL (SUBLIMAZE) injection 50 mcg (50 mcg Intravenous Given 01/18/21 2350)    ED Course  I have reviewed the triage vital signs and the nursing notes.  Pertinent labs & imaging results that were available during my care of the patient were reviewed by me and considered in my medical decision making (see chart for details).  Clinical Course as of 01/19/21 0046  Mon Jan 18, 2021  2307 X-ray left knee, [EW]  2310 X-ray left knee negative for fracture, will x-ray right hip for suspected fracture and check chest x-ray. [EW]    Clinical Course User Index [EW] Daleen Bo, MD   MDM Rules/Calculators/A&P                           Patient Vitals for the past 24 hrs:  BP Temp Temp src Pulse Resp SpO2  01/18/21 2300 124/66 -- -- 77 (!) 23 95 %  01/18/21 2014 (!) 152/57 98.5 F (36.9 C) Oral 68 16 99 %    11:45 PM-Reevaluation with update and discussion. After initial assessment and treatment, an updated evaluation reveals fairly comfortable, no change in status, findings discussed with patient's daughter and all questions were answered. Daleen Bo   Medical Decision Making:  This patient is presenting for evaluation of fall, which does require a range of treatment options, and is a complaint that involves a high risk of morbidity and mortality. The differential diagnoses include fracture, contusion, weakness, acute illness. I decided to review old records, and in summary [place] female walking in her kitchen, she has an unsteady gait at baseline and she fell while using her walker.  I obtained additional historical information from daughter at bedside.  Clinical Laboratory  Tests Ordered, included CBC, Metabolic panel and Viral infection.. Review indicates normal CBC. Radiologic Tests Ordered, included x-ray left knee and left hip.  I independently Visualized: Radiograph images, which show left hip fracture    Critical Interventions-  CLINICAL  evaluation, laboratory testing, radiography, medication treatment, observation reassess  After These Interventions, the Patient was reevaluated and was found with improved pain after pain medication.  Patient with intertrochanteric fracture, requiring surgical intervention.  CRITICAL CARE-yes Performed by: Daleen Bo  Nursing Notes Reviewed/ Care Coordinated Applicable Imaging Reviewed Interpretation of Laboratory Data incorporated into ED treatment   Plan-disposition by Dr. Roxanne Mins following completion of evaluation, and orthopedic consultation    Final Clinical Impression(s) / ED Diagnoses Final diagnoses:  Closed fracture of left hip, initial encounter [facility])  Fall, initial encounter    Rx / DC Orders ED Discharge Orders    None       Daleen Bo, MD 01/19/21 [phone]

## 2021-01-18 NOTE — ED Triage Notes (Signed)
Reported by [facility] that pt is from home and fell, now c/o left knee pain.  Reported no LOC.

## 2021-01-18 NOTE — ED Provider Notes (Incomplete)
[facility] EMERGENCY DEPARTMENT Provider Note   CSN: [id number] Arrival date & time: 01/18/21  1949     History Chief Complaint  Patient presents with  . Fall    Amy Sawyer is a 87 y.o. female.  HPI Patient was at home with her husband when she was walking in the kitchen without her walker, and fell against a refrigerator.  Patient's husband called the patient's daughter who lives adjacent and came quickly to the patient's home.  The daughter found the patient sitting against the commode in an upright position, with her legs out in front of her.  She seemed to have pain in her left knee and was transferred here by EMS for evaluation of that.  There has been no notable injury to her head or change in behavior according to the patient's daughter who is with her and gives history.  Patient is severely hard of hearing and all history is given by the daughter.  Level 5 caveat-high acuity    Past Medical History:  Diagnosis Date  . HOH (hard of hearing)   . HTN (hypertension)   . Hyperlipemia   . Stroke [facility])     Patient Active Problem List   Diagnosis Date Noted  . Vaginal itching 04/10/2020  . Mixed stress and urge urinary incontinence 04/10/2020  . Vulvovaginitis 04/10/2020  . Vaginal atrophy 04/10/2020  . Altered mental status 02/19/2018  . Hyperlipemia 02/19/2018  . Hypertension 02/19/2018  . Fever 02/19/2018  . Acute CVA (cerebrovascular accident) ([place]) 02/19/2018  . Protein-calorie malnutrition, severe 02/19/2018  . Encounter for dental exam and cleaning w/o abnormal findings 06/06/2017  . Hyponatremia 03/22/2014  . Pain in joint, shoulder region 04/09/2012  . Muscle weakness (generalized) 04/09/2012  . Proximal humerus fracture 03/13/2012    Past Surgical History:  Procedure Laterality Date  . BREAST SURGERY    . LEG SURGERY       OB History    Gravida  2   Para  2   Term  2   Preterm      AB      Living        SAB      IAB      Ectopic       Multiple      Live Births              Family History  Problem Relation Age of Onset  . Heart disease Other   . Diabetes Other   . Stroke Mother     Social History   Tobacco Use  . Smoking status: Never Smoker  . Smokeless tobacco: Never Used  Vaping Use  . Vaping Use: Never used  Substance Use Topics  . Alcohol use: No  . Drug use: No    Home Medications Prior to Admission medications   Medication Sig Start Date End Date Taking? Authorizing Provider  acetaminophen (TYLENOL) 500 MG tablet Take 500 mg by mouth every 6 (six) hours as needed for mild pain or headache.    [provider]  amLODipine (NORVASC) 2.5 MG tablet Take 2.5 mg by mouth daily.  10/12/18   [provider]  amLODipine (NORVASC) 5 MG tablet Take 5 mg by mouth daily.    [provider]  aspirin EC 325 MG EC tablet Take 1 tablet (325 mg total) by mouth daily. 02/21/18   Johnson, Clanford L, MD  clonazePAM (KLONOPIN) 0.5 MG tablet Take 0.25 mg by mouth 2 (two) times  daily as needed for anxiety.    [provider]  oxyCODONE-acetaminophen (PERCOCET/ROXICET) 5-325 MG tablet Take 1 tablet by mouth every 6 (six) hours as needed. Patient taking differently: Take 1 tablet by mouth every 6 (six) hours as needed for moderate pain or severe pain.  05/28/20   Carole Civil, MD  polyethylene glycol (MIRALAX) 17 g packet Take 17 g by mouth daily. 07/17/20   Gareth Morgan, MD  ramipril (ALTACE) 10 MG capsule Take 10 mg by mouth daily.    [provider]  sertraline (ZOLOFT) 50 MG tablet Take 50 mg by mouth daily.    [provider]  simvastatin (ZOCOR) 20 MG tablet Take 20 mg by mouth daily.    [provider]  trolamine salicylate (ASPERCREME) 10 % cream Apply 1 application topically 2 (two) times daily as needed for muscle pain.    [provider]    Allergies    Demerol [meperidine] and Hydrocodone-acetaminophen  Review of Systems    Review of Systems  Unable to perform ROS: Other    Physical Exam Updated Vital Signs BP (!) 152/57 (BP Location: Left Arm)   Pulse 68   Temp 98.5 F (36.9 C) (Oral)   Resp 16   SpO2 99%   Physical Exam Vitals and nursing note reviewed.  Constitutional:      General: She is in acute distress (Uncomfortable).     Appearance: She is well-developed. She is ill-appearing. She is not toxic-appearing or diaphoretic.  HENT:     Head: Normocephalic and atraumatic.     Right Ear: External ear normal.     Left Ear: External ear normal.  Eyes:     Conjunctiva/sclera: Conjunctivae normal.     Pupils: Pupils are equal, round, and reactive to light.  Neck:     Trachea: Phonation normal.  Cardiovascular:     Rate and Rhythm: Normal rate and regular rhythm.     Heart sounds: Normal heart sounds.  Pulmonary:     Effort: Pulmonary effort is normal. No respiratory distress.     Breath sounds: Normal breath sounds. No stridor.  Chest:     Chest wall: No tenderness.  Abdominal:     General: There is no distension.     Palpations: Abdomen is soft.     Tenderness: There is no abdominal tenderness.  Musculoskeletal:     Cervical back: Normal range of motion. No tenderness.     Comments: She guards against moving the left hip and left knee secondary to pain.  Left hip is externally rotated, and the left leg appears shortened.  Neurovascular intact distally in the left foot.  No deformity of arms or right leg.  Skin:    General: Skin is warm and dry.     Coloration: Skin is not jaundiced or pale.  Neurological:     Mental Status: She is alert.     Cranial Nerves: No cranial nerve deficit.     Sensory: No sensory deficit.     Motor: No abnormal muscle tone.     Coordination: Coordination normal.  Psychiatric:        Mood and Affect: Mood normal.        Behavior: Behavior normal.     ED Results / Procedures / Treatments   Labs (all labs ordered are listed, but only abnormal results  are displayed) Labs Reviewed - No data to display  EKG None  Radiology No results found.  Procedures Procedures {Remember to document  critical care time when appropriate:1}  Medications Ordered in ED Medications - No data to display  ED Course  I have reviewed the triage vital signs and the nursing notes.  Pertinent labs & imaging results that were available during my care of the patient were reviewed by me and considered in my medical decision making (see chart for details).  Clinical Course as of 01/18/21 2312  Mon Jan 18, 2021  2307 X-ray left knee, [EW]  2310 X-ray left knee negative for fracture, will x-ray right hip for suspected fracture and check chest x-ray. [EW]    Clinical Course User Index [EW] Daleen Bo, MD   MDM Rules/Calculators/A&P                           Patient Vitals for the past 24 hrs:  BP Temp Temp src Pulse Resp SpO2  01/18/21 2014 (!) 152/57 98.5 F (36.9 C) Oral 68 16 99 %    11:11 PM Reevaluation with update and discussion. After initial assessment and treatment, an updated evaluation reveals ***. Daleen Bo   Medical Decision Making:  This patient is presenting for evaluation of fall, which does require a range of treatment options, and is a complaint that involves a high risk of morbidity and mortality. The differential diagnoses include fracture, contusion, weakness, acute illness. I decided to review old records, and in summary [place] female walking in her kitchen, she has an unsteady gait at baseline and she fell while using her walker.  I obtained additional historical information from daughter at bedside.  Clinical Laboratory Tests Ordered, included CBC, Metabolic panel and Viral infection.. Review indicates ***. Radiologic Tests Ordered, included x-ray left knee and left hip.  I independently Visualized: Radiograph images, which show left hip fracture  Cardiac Monitor Tracing which shows ***   Specimen of *** indicating  ***. I discussed the *** Results with Dr. Marland Kitchen I ordered and Reviewed the *** Medicine Test.  Critical Interventions- ***  After These Interventions, the Patient was reevaluated and was found ***  CRITICAL CARE- *** Performed by: Daleen Bo  ***    Final Clinical Impression(s) / ED Diagnoses Final diagnoses:  None    Rx / DC Orders ED Discharge Orders    None

## 2021-01-19 ENCOUNTER — Encounter (HOSPITAL_COMMUNITY): Payer: Self-pay | Admitting: Family Medicine

## 2021-01-19 ENCOUNTER — Other Ambulatory Visit: Payer: Self-pay

## 2021-01-19 ENCOUNTER — Emergency Department (HOSPITAL_COMMUNITY): Payer: Medicare Other

## 2021-01-19 DIAGNOSIS — Z885 Allergy status to narcotic agent status: Secondary | ICD-10-CM | POA: Diagnosis not present

## 2021-01-19 DIAGNOSIS — R2681 Unsteadiness on feet: Secondary | ICD-10-CM | POA: Diagnosis not present

## 2021-01-19 DIAGNOSIS — Z7982 Long term (current) use of aspirin: Secondary | ICD-10-CM | POA: Diagnosis not present

## 2021-01-19 DIAGNOSIS — Z66 Do not resuscitate: Secondary | ICD-10-CM | POA: Diagnosis not present

## 2021-01-19 DIAGNOSIS — S72009A Fracture of unspecified part of neck of unspecified femur, initial encounter for closed fracture: Secondary | ICD-10-CM | POA: Diagnosis present

## 2021-01-19 DIAGNOSIS — S72142A Displaced intertrochanteric fracture of left femur, initial encounter for closed fracture: Secondary | ICD-10-CM | POA: Diagnosis not present

## 2021-01-19 DIAGNOSIS — Z79899 Other long term (current) drug therapy: Secondary | ICD-10-CM | POA: Diagnosis not present

## 2021-01-19 DIAGNOSIS — E43 Unspecified severe protein-calorie malnutrition: Secondary | ICD-10-CM | POA: Diagnosis not present

## 2021-01-19 DIAGNOSIS — W010XXA Fall on same level from slipping, tripping and stumbling without subsequent striking against object, initial encounter: Secondary | ICD-10-CM | POA: Diagnosis not present

## 2021-01-19 DIAGNOSIS — Z8673 Personal history of transient ischemic attack (TIA), and cerebral infarction without residual deficits: Secondary | ICD-10-CM | POA: Diagnosis not present

## 2021-01-19 DIAGNOSIS — E871 Hypo-osmolality and hyponatremia: Secondary | ICD-10-CM | POA: Diagnosis not present

## 2021-01-19 DIAGNOSIS — H9190 Unspecified hearing loss, unspecified ear: Secondary | ICD-10-CM | POA: Diagnosis not present

## 2021-01-19 DIAGNOSIS — S72002A Fracture of unspecified part of neck of left femur, initial encounter for closed fracture: Secondary | ICD-10-CM

## 2021-01-19 DIAGNOSIS — I639 Cerebral infarction, unspecified: Secondary | ICD-10-CM | POA: Diagnosis not present

## 2021-01-19 DIAGNOSIS — S72142D Displaced intertrochanteric fracture of left femur, subsequent encounter for closed fracture with routine healing: Secondary | ICD-10-CM | POA: Diagnosis not present

## 2021-01-19 DIAGNOSIS — H9193 Unspecified hearing loss, bilateral: Secondary | ICD-10-CM | POA: Diagnosis not present

## 2021-01-19 DIAGNOSIS — I10 Essential (primary) hypertension: Secondary | ICD-10-CM | POA: Diagnosis not present

## 2021-01-19 DIAGNOSIS — I119 Hypertensive heart disease without heart failure: Secondary | ICD-10-CM | POA: Diagnosis not present

## 2021-01-19 DIAGNOSIS — M25562 Pain in left knee: Secondary | ICD-10-CM | POA: Diagnosis not present

## 2021-01-19 DIAGNOSIS — R262 Difficulty in walking, not elsewhere classified: Secondary | ICD-10-CM | POA: Diagnosis not present

## 2021-01-19 DIAGNOSIS — D649 Anemia, unspecified: Secondary | ICD-10-CM | POA: Diagnosis not present

## 2021-01-19 DIAGNOSIS — Z9181 History of falling: Secondary | ICD-10-CM | POA: Diagnosis not present

## 2021-01-19 DIAGNOSIS — F419 Anxiety disorder, unspecified: Secondary | ICD-10-CM | POA: Diagnosis not present

## 2021-01-19 DIAGNOSIS — E785 Hyperlipidemia, unspecified: Secondary | ICD-10-CM | POA: Diagnosis not present

## 2021-01-19 DIAGNOSIS — Z823 Family history of stroke: Secondary | ICD-10-CM | POA: Diagnosis not present

## 2021-01-19 DIAGNOSIS — M6281 Muscle weakness (generalized): Secondary | ICD-10-CM | POA: Diagnosis not present

## 2021-01-19 DIAGNOSIS — Z20822 Contact with and (suspected) exposure to covid-19: Secondary | ICD-10-CM | POA: Diagnosis not present

## 2021-01-19 DIAGNOSIS — M25552 Pain in left hip: Secondary | ICD-10-CM | POA: Diagnosis not present

## 2021-01-19 DIAGNOSIS — Z681 Body mass index (BMI) 19 or less, adult: Secondary | ICD-10-CM | POA: Diagnosis not present

## 2021-01-19 LAB — TYPE AND SCREEN
ABO/RH(D): AB POS
Antibody Screen: NEGATIVE

## 2021-01-19 LAB — CBC WITH DIFFERENTIAL/PLATELET
Abs Immature Granulocytes: 0.04 10*3/uL (ref 0.00–0.07)
Basophils Absolute: 0.1 10*3/uL (ref 0.0–0.1)
Basophils Relative: 1 %
Eosinophils Absolute: 0 10*3/uL (ref 0.0–0.5)
Eosinophils Relative: 0 %
HCT: 38.4 % (ref 36.0–46.0)
Hemoglobin: 12.8 g/dL (ref 12.0–15.0)
Immature Granulocytes: 0 %
Lymphocytes Relative: 6 %
Lymphs Abs: 0.6 10*3/uL — ABNORMAL LOW (ref 0.7–4.0)
MCH: 31.4 pg (ref 26.0–34.0)
MCHC: 33.3 g/dL (ref 30.0–36.0)
MCV: 94.3 fL (ref 80.0–100.0)
Monocytes Absolute: 0.5 10*3/uL (ref 0.1–1.0)
Monocytes Relative: 5 %
Neutro Abs: 8.4 10*3/uL — ABNORMAL HIGH (ref 1.7–7.7)
Neutrophils Relative %: 88 %
Platelets: 191 10*3/uL (ref 150–400)
RBC: 4.07 MIL/uL (ref 3.87–5.11)
RDW: 13.2 % (ref 11.5–15.5)
WBC: 9.7 10*3/uL (ref 4.0–10.5)
nRBC: 0 % (ref 0.0–0.2)

## 2021-01-19 LAB — URINALYSIS, ROUTINE W REFLEX MICROSCOPIC
Bacteria, UA: NONE SEEN
Bilirubin Urine: NEGATIVE
Glucose, UA: NEGATIVE mg/dL
Ketones, ur: 5 mg/dL — AB
Leukocytes,Ua: NEGATIVE
Nitrite: NEGATIVE
Protein, ur: 30 mg/dL — AB
Specific Gravity, Urine: 1.019 (ref 1.005–1.030)
pH: 7 (ref 5.0–8.0)

## 2021-01-19 LAB — COMPREHENSIVE METABOLIC PANEL
ALT: 12 U/L (ref 0–44)
AST: 20 U/L (ref 15–41)
Albumin: 3.5 g/dL (ref 3.5–5.0)
Alkaline Phosphatase: 41 U/L (ref 38–126)
Anion gap: 7 (ref 5–15)
BUN: 14 mg/dL (ref 8–23)
CO2: 26 mmol/L (ref 22–32)
Calcium: 8.5 mg/dL — ABNORMAL LOW (ref 8.9–10.3)
Chloride: 99 mmol/L (ref 98–111)
Creatinine, Ser: 0.71 mg/dL (ref 0.44–1.00)
GFR, Estimated: >60 mL/min (ref >60)
Glucose, Bld: 119 mg/dL — ABNORMAL HIGH (ref 70–99)
Potassium: 3.9 mmol/L (ref 3.5–5.1)
Sodium: 132 mmol/L — ABNORMAL LOW (ref 135–145)
Total Bilirubin: 0.7 mg/dL (ref 0.3–1.2)
Total Protein: 5.8 g/dL — ABNORMAL LOW (ref 6.5–8.1)

## 2021-01-19 LAB — CBC
HCT: 33.5 % — ABNORMAL LOW (ref 36.0–46.0)
Hemoglobin: 10.9 g/dL — ABNORMAL LOW (ref 12.0–15.0)
MCH: 30.5 pg (ref 26.0–34.0)
MCHC: 32.5 g/dL (ref 30.0–36.0)
MCV: 93.8 fL (ref 80.0–100.0)
Platelets: 171 10*3/uL (ref 150–400)
RBC: 3.57 MIL/uL — ABNORMAL LOW (ref 3.87–5.11)
RDW: 12.8 % (ref 11.5–15.5)
WBC: 6.9 10*3/uL (ref 4.0–10.5)
nRBC: 0 % (ref 0.0–0.2)

## 2021-01-19 LAB — BASIC METABOLIC PANEL
Anion gap: 11 (ref 5–15)
BUN: 14 mg/dL (ref 8–23)
CO2: 26 mmol/L (ref 22–32)
Calcium: 8.9 mg/dL (ref 8.9–10.3)
Chloride: 99 mmol/L (ref 98–111)
Creatinine, Ser: 0.83 mg/dL (ref 0.44–1.00)
GFR, Estimated: >60 mL/min (ref >60)
Glucose, Bld: 129 mg/dL — ABNORMAL HIGH (ref 70–99)
Potassium: 4 mmol/L (ref 3.5–5.1)
Sodium: 136 mmol/L (ref 135–145)

## 2021-01-19 LAB — SURGICAL PCR SCREEN
MRSA, PCR: NEGATIVE
Staphylococcus aureus: NEGATIVE

## 2021-01-19 LAB — ABO/RH: ABO/RH(D): AB POS

## 2021-01-19 LAB — RESP PANEL BY RT-PCR (FLU A&B, COVID) ARPGX2
Influenza A by PCR: NEGATIVE
Influenza B by PCR: NEGATIVE
SARS Coronavirus 2 by RT PCR: NEGATIVE

## 2021-01-19 MED ORDER — PROSOURCE PLUS PO LIQD
30.0000 mL | Freq: Two times a day (BID) | ORAL | Status: DC
  Administered 2021-01-19 – 2021-01-22 (×4): 30 mL via ORAL
  Filled 2021-01-19 (×4): qty 30

## 2021-01-19 MED ORDER — SODIUM CHLORIDE 0.9 % IV SOLN: INTRAVENOUS | Status: DC

## 2021-01-19 MED ORDER — SIMVASTATIN 20 MG PO TABS
20.0000 mg | ORAL_TABLET | Freq: Every day | ORAL | Status: DC
  Administered 2021-01-19 – 2021-01-22 (×3): 20 mg via ORAL
  Filled 2021-01-19 (×3): qty 1

## 2021-01-19 MED ORDER — RAMIPRIL 5 MG PO CAPS
10.0000 mg | ORAL_CAPSULE | Freq: Every day | ORAL | Status: DC
  Administered 2021-01-19 – 2021-01-22 (×3): 10 mg via ORAL
  Filled 2021-01-19 (×3): qty 2

## 2021-01-19 MED ORDER — AMLODIPINE BESYLATE 5 MG PO TABS
2.5000 mg | ORAL_TABLET | Freq: Every day | ORAL | Status: DC
  Administered 2021-01-19 – 2021-01-22 (×3): 2.5 mg via ORAL
  Filled 2021-01-19 (×3): qty 1

## 2021-01-19 MED ORDER — ADULT MULTIVITAMIN W/MINERALS CH
1.0000 | ORAL_TABLET | Freq: Every day | ORAL | Status: DC
  Administered 2021-01-19 – 2021-01-22 (×3): 1 via ORAL
  Filled 2021-01-19 (×3): qty 1

## 2021-01-19 MED ORDER — MORPHINE SULFATE (PF) 4 MG/ML IV SOLN
4.0000 mg | Freq: Once | INTRAVENOUS | Status: AC
  Administered 2021-01-19: 4 mg via INTRAVENOUS
  Filled 2021-01-19: qty 1

## 2021-01-19 MED ORDER — OXYCODONE-ACETAMINOPHEN 5-325 MG PO TABS
1.0000 | ORAL_TABLET | Freq: Four times a day (QID) | ORAL | Status: DC | PRN
  Administered 2021-01-21: 1 via ORAL
  Filled 2021-01-19: qty 1

## 2021-01-19 MED ORDER — OXYCODONE HCL 5 MG PO TABS
5.0000 mg | ORAL_TABLET | ORAL | Status: DC | PRN
  Administered 2021-01-19: 5 mg via ORAL
  Filled 2021-01-19: qty 1

## 2021-01-19 MED ORDER — ACETAMINOPHEN 325 MG PO TABS: 650.0000 mg | ORAL_TABLET | Freq: Four times a day (QID) | ORAL | Status: DC | PRN

## 2021-01-19 MED ORDER — SERTRALINE HCL 50 MG PO TABS
50.0000 mg | ORAL_TABLET | Freq: Every day | ORAL | Status: DC
  Administered 2021-01-19 – 2021-01-22 (×3): 50 mg via ORAL
  Filled 2021-01-19 (×3): qty 1

## 2021-01-19 MED ORDER — ACETAMINOPHEN 650 MG RE SUPP: 650.0000 mg | Freq: Four times a day (QID) | RECTAL | Status: DC | PRN

## 2021-01-19 MED ORDER — ONDANSETRON HCL 4 MG/2ML IJ SOLN: 4.0000 mg | Freq: Four times a day (QID) | INTRAMUSCULAR | Status: DC | PRN

## 2021-01-19 MED ORDER — CEFAZOLIN SODIUM-DEXTROSE 2-4 GM/100ML-% IV SOLN
2.0000 g | INTRAVENOUS | Status: AC
  Administered 2021-01-20: 2 g via INTRAVENOUS

## 2021-01-19 MED ORDER — ENSURE ENLIVE PO LIQD
237.0000 mL | Freq: Three times a day (TID) | ORAL | Status: DC
  Administered 2021-01-21 – 2021-01-22 (×2): 237 mL via ORAL

## 2021-01-19 MED ORDER — MORPHINE SULFATE (PF) 2 MG/ML IV SOLN
2.0000 mg | INTRAVENOUS | Status: DC | PRN
Start: 2021-01-19 — End: 2021-01-22
  Administered 2021-01-19 – 2021-01-22 (×9): 2 mg via INTRAVENOUS
  Filled 2021-01-19 (×9): qty 1

## 2021-01-19 MED ORDER — ONDANSETRON HCL 4 MG PO TABS: 4.0000 mg | ORAL_TABLET | Freq: Four times a day (QID) | ORAL | Status: DC | PRN

## 2021-01-19 MED ORDER — TRANEXAMIC ACID-NACL 1000-0.7 MG/100ML-% IV SOLN
1000.0000 mg | INTRAVENOUS | Status: AC
  Administered 2021-01-20: 1000 mg via INTRAVENOUS

## 2021-01-19 MED ORDER — CLONAZEPAM 0.125 MG PO TBDP
0.2500 mg | ORAL_TABLET | Freq: Two times a day (BID) | ORAL | Status: DC | PRN
  Administered 2021-01-19 – 2021-01-22 (×2): 0.25 mg via ORAL
  Filled 2021-01-19 (×2): qty 2

## 2021-01-19 NOTE — Discharge Instructions (Signed)
[facility] Stay Proper nutrition can help your body recover from illness and injury.   Foods and beverages high in protein, vitamins, and minerals help rebuild muscle loss, promote healing, & reduce fall risk.   .In addition to eating healthy foods, a nutrition shake is an easy, delicious way to get the nutrition you need during and after your hospital stay  It is recommended that you continue to drink 3 bottles per day of: Boost for at least 1 month (30 days) after your hospital stay  Tips for adding a nutrition shake into your routine: As allowed, drink one with vitamins or medications instead of water or juice Enjoy one as a tasty mid-morning or afternoon snack Drink cold or make a milkshake out of it Drink one instead of milk with cereal or snacks Use as a coffee creamer   Available at the following grocery stores and pharmacies:           * [place] [phone]            For COUPONS visit: www.ensure.com/join or [email or web address]-may.[email or web address]   Suggested Substitutions Ensure Plus = Boost Plus = Carnation Breakfast Essentials = Boost Compact Ensure Active Clear = Boost Breeze Glucerna Shake = Boost Glucose Control = Carnation Breakfast Essentials SUGAR FREE  Suggestions For Increasing Calories And Protein . Several small meals a day are easier to eat and digest than three large ones. Space meals about 2 to 3 hours apart to maximize comfort. . Stop eating 2 to 3 hours before bed and sleep with your head elevated if gastric reflux (GERD) and heartburn are problems. . Do not eat your favorite foods if you are feeling bad. Save them for when you feel good! . Eat breakfast-type foods at any meal. Eggs are usually easy to eat and are great any time of the day. (The same goes for pancakes and waffles.) . Eat  when you feel hungry. Most people have the greatest appetite in the morning because they have not eaten all night. If this is the best meal for you, then pile on those calories and other nutrients in the morning and at lunch. Then you can have a smaller dinner without losing total calories for the day. . Eat leftovers or nutritious snacks in the afternoon and early evening to round out your day. . Try homemade or commercially prepared nutrition bars and puddings, as well as calorie- and protein-rich liquid nutritional supplements. Benefits of Physical Activity Talk to your doctor about physical activity. Light or moderate physical activity can help maintain muscle and promote an appetite. Walking in the neighborhood or the local mall is a great way to get up, get out, and get moving. If you are unsteady on your feet, try walking around the dining room table. Save Room for [employer]! Drink most fluids between meals instead of with meals. (It is fine to have a sip to help swallow food at meal time.) Fluids (which usually have fewer calories and nutrients than solid food) can take up valuable space in your stomach.  Foods Recommended High-Protein Foods Milk products Add cheese to toast, crackers, sandwiches, baked potatoes, vegetables, soups, noodles, meat, and fruit. Use reduced-fat (2%) or  whole milk in place of water when cooking cereal and cream soups. Include cream sauces on vegetables and pasta. Add powdered milk to cream soups and mashed potatoes.  Eggs Have hard-cooked eggs readily available in the refrigerator. Chop and add to salads, casseroles, soups, and vegetables. Make a quick egg salad. All eggs should be well cooked to avoid the risk of harmful bacteria.  Meats, poultry, and fish Add leftover cooked meats to soups, casseroles, salads, and omelets. Make dip by mixing diced, chopped, or shredded meat with sour cream and spices.  Beans, legumes, nuts, and seeds Sprinkle nuts and seeds  on cereals, fruit, and desserts such as ice cream, pudding, and custard. Also serve nuts and seeds on vegetables, salads, and pasta. Spread peanut butter on toast, bread, English muffins, and fruit, or blend it in a milk shake. Add beans and peas to salads, soups, casseroles, and vegetable dishes.  High-Calorie Foods Butter, margarine, and  oils Melt butter or margarine over potatoes, rice, pasta, and cooked vegetables. Add melted butter or margarine into soups and casseroles and spread on bread for sandwiches before spreading sandwich spread or peanut butter. Saut or stir-fry vegetables, meats, chicken and fish such as shrimp/scallops in olive or canola oil. A variety of oils add calories and can be used to [employer], chicken, or fish.  Milk products Add whipping cream to desserts, pancakes, waffles, fruit, and hot chocolate, and fold it into soups and casseroles. Add sour cream to baked potatoes and vegetables.  Salad dressing Use regular (not low-fat or diet) mayonnaise and salad dressing on sandwiches and in dips with vegetables and fruit.   Sweets Add jelly and honey to bread and crackers. Add jam to fruit and ice cream and as a topping over cake.   Copyright 2020  [employer]. All rights reserved.

## 2021-01-19 NOTE — Plan of Care (Signed)

## 2021-01-19 NOTE — H&P (Signed)
TRH H&P    Patient Demographics:    Amy Sawyer, is a 87 y.o. female  MRN: [id number]  DOB - 06/28/1931  Admit Date - 01/18/2021  Referring MD/NP/PA: Roxanne Sawyer  Outpatient Primary MD for the patient is Amy Noble, MD  Patient coming from: Home  Chief complaint- Fall   HPI:    Amy Sawyer  is a 87 y.o. female, who is extremely hard of hearing, with history of stroke, hyperlipidemia, hypertension presents the ED with a chief complaint of fall.  History is limited by patient has hearing impairment and also the fact that she was just given pain medication and is calm drowsy.  Daughter at bedside does her best to explain what happened.  Apparently, patient was in the kitchen putting away groceries when she went to turn around lost balance and fell backwards.  She fell against the fridge.  I do think she hit her head, but she did not lose consciousness.  She has not had any change in altered mental status prior to the pain medication.  Patient lives with husband, who was not able to get her up.  They called daughter who lives across the street and was able to come over and help.  At that time patient was complaining mostly of left knee pain.  Of note patient ambulates with a walker, but he continues to smoke get the walker into it.  She often has spells where she loses her balance.  Otherwise she has been in her normal state of.  Patient is DNR.  In the ED Temperature 98.5, heart rate 68-77, blood pressure 124/66, respiratory 23, satting at 95% Chest x-ray shows left upper lobe opacity which may represent artifact due to patient rotation consider chest CT -at this time chest CT is not ordered as patient is resting, and to better characterize nodules is nonemergent reason to take to CT Imaging of the hip shows acute comminuted displaced angulated left intertrochanteric femoral neck fracture with avulsion of the lesser  trochanter EKG shows a heart rate of 89, sinus rhythm, QTC 530 No leukocytosis with a white blood cell count of 9.7, hemoglobin 12.8 Chemistry panel is mostly unremarkable Knee x-ray shows degenerative changes without acute osseous abnormality.  Small joint effusion Orthopedics was consulted and recommended no DVT prophylaxis, n.p.o., IV fluids, plan for surgery on Wednesday. Admission requested for medical management    Review of systems:    In addition to the HPI above,  No Fever-chills, No Headache, No changes with Vision or hearing, No problems swallowing food or Liquids, No Chest pain, Cough or Shortness of Breath, No Abdominal pain, No Nausea or Vomiting, bowel movements are regular, No Blood in stool or Urine, No dysuria, No new skin rashes or bruises, No new weakness, tingling, numbness in any extremity, No recent weight gain or loss, No polyuria, polydypsia or polyphagia, No significant Mental Stressors.  All other systems reviewed and are negative.    Past History of the following :    Past Medical History:  Diagnosis Date  . HOH (hard  of hearing)   . HTN (hypertension)   . Hyperlipemia   . Stroke [facility])       Past Surgical History:  Procedure Laterality Date  . BREAST SURGERY    . LEG SURGERY        Social History:      Social History   Tobacco Use  . Smoking status: Never Smoker  . Smokeless tobacco: Never Used  Substance Use Topics  . Alcohol use: No       Family History :     Family History  Problem Relation Age of Onset  . Heart disease Other   . Diabetes Other   . Stroke Mother       Home Medications:   Prior to Admission medications   Medication Sig Start Date End Date Taking? Authorizing Provider  acetaminophen (TYLENOL) 500 MG tablet Take 500 mg by mouth every 6 (six) hours as needed for mild pain or headache.    [provider]  amLODipine (NORVASC) 2.5 MG tablet Take 2.5 mg by mouth daily.  10/12/18   [provider]  amLODipine (NORVASC) 5 MG tablet Take 5 mg by mouth daily.    [provider]  aspirin EC 325 MG EC tablet Take 1 tablet (325 mg total) by mouth daily. 02/21/18   Johnson, Clanford L, MD  clonazePAM (KLONOPIN) 0.5 MG tablet Take 0.25 mg by mouth 2 (two) times daily as needed for anxiety.    [provider]  oxyCODONE-acetaminophen (PERCOCET/ROXICET) 5-325 MG tablet Take 1 tablet by mouth every 6 (six) hours as needed. Patient taking differently: Take 1 tablet by mouth every 6 (six) hours as needed for moderate pain or severe pain.  05/28/20   Carole Civil, MD  polyethylene glycol (MIRALAX) 17 g packet Take 17 g by mouth daily. 07/17/20   Amy Morgan, MD  ramipril (ALTACE) 10 MG capsule Take 10 mg by mouth daily.    [provider]  sertraline (ZOLOFT) 50 MG tablet Take 50 mg by mouth daily.    [provider]  simvastatin (ZOCOR) 20 MG tablet Take 20 mg by mouth daily.    [provider]  trolamine salicylate (ASPERCREME) 10 % cream Apply 1 application topically 2 (two) times daily as needed for muscle pain.    [provider]     Allergies:     Allergies  Allergen Reactions  . Demerol [Meperidine] Anaphylaxis  . Hydrocodone-Acetaminophen Nausea And Vomiting     Physical Exam:   Vitals  Blood pressure (!) 135/59, pulse 71, temperature (!) 97.5 F (36.4 C), temperature source Oral, resp. rate (!) 23, SpO2 94 %.  1.  General: Patient lying supine in bed in no acute distress  2. Psychiatric: Mood and behavior normal for situation, memory intact, alert and oriented x3  3. Neurologic: Speech and language are normal, face is symmetric, moves all 4 extremities voluntarily, no focal deficits on limited exam  4. HEENMT:  Head is atraumatic, normocephalic, pupils are reactive to light, neck is supple, trachea is midline, mucous membranes are moist  5. Respiratory : Lungs are clear to auscultation  bilaterally without wheezes, rhonchi, rales, no cyanosis, no clubbing  6. Cardiovascular : Heart rate is normal, rhythm is regular, no murmurs rubs or gallops, peripheral edema present  7. Gastrointestinal:  Abdomen is soft, nondistended, nontender to palpation, bowel sounds normal  8. Skin:  Skin is warm dry and intact without acute lesion on limited exam  9.Musculoskeletal:  No  pitting edema present, no calf tenderness, severe arthritic deformities of both lower extremities left leg is externally rotated and abducted   Data Review:    CBC Recent Labs  Lab 01/18/21 0007  WBC 9.7  HGB 12.8  HCT 38.4  PLT 191  MCV 94.3  MCH 31.4  MCHC 33.3  RDW 13.2  LYMPHSABS 0.6*  MONOABS 0.5  EOSABS 0.0  BASOSABS 0.1   ------------------------------------------------------------------------------------------------------------------  Results for orders placed or performed during the hospital encounter of 01/18/21 (from the past 48 hour(s))  Basic metabolic panel     Status: Abnormal   Collection Time: 01/18/21 12:07 AM  Result Value Ref Range   Sodium 136 135 - 145 mmol/L   Potassium 4.0 3.5 - 5.1 mmol/L   Chloride 99 98 - 111 mmol/L   CO2 26 22 - 32 mmol/L   Glucose, Bld 129 (H) 70 - 99 mg/dL    Comment: Glucose reference range applies only to samples taken after fasting for at least 8 hours.   BUN 14 8 - 23 mg/dL   Creatinine, Ser 0.83 0.44 - 1.00 mg/dL   Calcium 8.9 8.9 - 10.3 mg/dL   GFR, Estimated >60 >60 mL/min    Comment: (NOTE) Calculated using the CKD-EPI Creatinine Equation (2021)    Anion gap 11 5 - 15    Comment: Performed at [facility], [address], [place], [place] [address]  CBC with Differential     Status: Abnormal   Collection Time: 01/18/21 12:07 AM  Result Value Ref Range   WBC 9.7 4.0 - 10.5 K/uL   RBC 4.07 3.87 - 5.11 MIL/uL   Hemoglobin 12.8 12.0 - 15.0 g/dL   HCT 38.4 36.0 - 46.0 %   MCV 94.3 80.0 - 100.0 fL   MCH 31.4 26.0 - 34.0 pg   MCHC  33.3 30.0 - 36.0 g/dL   RDW 13.2 11.5 - 15.5 %   Platelets 191 150 - 400 K/uL   nRBC 0.0 0.0 - 0.2 %   Neutrophils Relative % 88 %   Neutro Abs 8.4 (H) 1.7 - 7.7 K/uL   Lymphocytes Relative 6 %   Lymphs Abs 0.6 (L) 0.7 - 4.0 K/uL   Monocytes Relative 5 %   Monocytes Absolute 0.5 0.1 - 1.0 K/uL   Eosinophils Relative 0 %   Eosinophils Absolute 0.0 0.0 - 0.5 K/uL   Basophils Relative 1 %   Basophils Absolute 0.1 0.0 - 0.1 K/uL   Immature Granulocytes 0 %   Abs Immature Granulocytes 0.04 0.00 - 0.07 K/uL    Comment: Performed at [facility], [address], [place], [place] [address]  Resp Panel by RT-PCR (Flu A&B, Covid) Nasopharyngeal Swab     Status: None   Collection Time: 01/18/21 11:53 PM   Specimen: Nasopharyngeal Swab; Nasopharyngeal(NP) swabs in vial transport medium  Result Value Ref Range   SARS Coronavirus 2 by RT PCR NEGATIVE NEGATIVE    Comment: (NOTE) SARS-CoV-2 target nucleic acids are NOT DETECTED.  The SARS-CoV-2 RNA is generally detectable in upper respiratory specimens during the acute phase of infection. The lowest concentration of SARS-CoV-2 viral copies this assay can detect is 138 copies/mL. A negative result does not preclude SARS-Cov-2 infection and should not be used as the sole basis for treatment or other patient management decisions. A negative result may occur with  improper specimen collection/handling, submission of specimen other than nasopharyngeal swab, presence of viral mutation(s) within the areas targeted by this assay, and inadequate  number of viral copies(<138 copies/mL). A negative result must be combined with clinical observations, patient history, and epidemiological information. The expected result is Negative.  Fact Sheet for Patients:  EntrepreneurPulse.com.au  Fact Sheet for Healthcare Providers:  IncredibleEmployment.be  This test is no t yet approved or cleared by the [place] FDA and   has been authorized for detection and/or diagnosis of SARS-CoV-2 by FDA under an Emergency Use Authorization (EUA). This EUA will remain  in effect (meaning this test can be used) for the duration of the COVID-19 declaration under Section 564(b)(1) of the Act, 21 U.S.C.section 360bbb-3(b)(1), unless the authorization is terminated  or revoked sooner.       Influenza A by PCR NEGATIVE NEGATIVE   Influenza B by PCR NEGATIVE NEGATIVE    Comment: (NOTE) The Xpert Xpress SARS-CoV-2/FLU/RSV plus assay is intended as an aid in the diagnosis of influenza from Nasopharyngeal swab specimens and should not be used as a sole basis for treatment. Nasal washings and aspirates are unacceptable for Xpert Xpress SARS-CoV-2/FLU/RSV testing.  Fact Sheet for Patients: EntrepreneurPulse.com.au  Fact Sheet for Healthcare Providers: IncredibleEmployment.be  This test is not yet approved or cleared by the [place] FDA and has been authorized for detection and/or diagnosis of SARS-CoV-2 by FDA under an Emergency Use Authorization (EUA). This EUA will remain in effect (meaning this test can be used) for the duration of the COVID-19 declaration under Section 564(b)(1) of the Act, 21 U.S.C. section 360bbb-3(b)(1), unless the authorization is terminated or revoked.  Performed at [facility], [address], [place], [place] [address]   Type and screen [facility]     Status: None   Collection Time: 01/19/21  1:22 AM  Result Value Ref Range   ABO/RH(D) AB POS    Antibody Screen NEG    Sample Expiration      01/22/2021,2359 Performed at [facility], [address] [place], [place] [address]   ABO/Rh     Status: None (Preliminary result)   Collection Time: 01/19/21  2:59 AM  Result Value Ref Range   ABO/RH(D)      AB POS Performed at [facility], [address], [place], [place] [address]     Chemistries  Recent Labs  Lab 01/18/21 0007  NA  136  K 4.0  CL 99  CO2 26  GLUCOSE 129*  BUN 14  CREATININE 0.83  CALCIUM 8.9   ------------------------------------------------------------------------------------------------------------------  ------------------------------------------------------------------------------------------------------------------ GFR: CrCl cannot be calculated (Unknown ideal weight.). Liver Function Tests: No results for input(s): AST, ALT, ALKPHOS, BILITOT, PROT, ALBUMIN in the last 168 hours. No results for input(s): LIPASE, AMYLASE in the last 168 hours. No results for input(s): AMMONIA in the last 168 hours. Coagulation Profile: No results for input(s): INR, PROTIME in the last 168 hours. Cardiac Enzymes: No results for input(s): CKTOTAL, CKMB, CKMBINDEX, TROPONINI in the last 168 hours. BNP (last 3 results) No results for input(s): PROBNP in the last 8760 hours. HbA1C: No results for input(s): HGBA1C in the last 72 hours. CBG: No results for input(s): GLUCAP in the last 168 hours. Lipid Profile: No results for input(s): CHOL, HDL, LDLCALC, TRIG, CHOLHDL, LDLDIRECT in the last 72 hours. Thyroid Function Tests: No results for input(s): TSH, T4TOTAL, FREET4, T3FREE, THYROIDAB in the last 72 hours. Anemia Panel: No results for input(s): VITAMINB12, FOLATE, FERRITIN, TIBC, IRON, RETICCTPCT in the last 72 hours.  --------------------------------------------------------------------------------------------------------------- Urine analysis:    Component Value Date/Time   COLORURINE YELLOW 07/17/2020 1434   APPEARANCEUR  CLEAR 07/17/2020 1434   LABSPEC 1.004 (L) 07/17/2020 1434   PHURINE 7.0 07/17/2020 1434   GLUCOSEU NEGATIVE 07/17/2020 1434   HGBUR MODERATE (A) 07/17/2020 1434   BILIRUBINUR negative 08/03/2020 1437   KETONESUR negative 08/03/2020 1437   KETONESUR 5 (A) 07/17/2020 1434   PROTEINUR negative 08/03/2020 1437   PROTEINUR NEGATIVE 07/17/2020 1434   UROBILINOGEN 0.2 08/03/2020  1437   NITRITE Positive (A) 08/03/2020 1437   NITRITE NEGATIVE 07/17/2020 1434   LEUKOCYTESUR Small (1+) (A) 08/03/2020 1437   LEUKOCYTESUR SMALL (A) 07/17/2020 1434      Imaging Results:    DG Chest Port 1 View  Result Date: 01/19/2021 CLINICAL DATA:  Preop examination for left hip fracture EXAM: PORTABLE CHEST 1 VIEW COMPARISON:  July 17, 2020 FINDINGS: Patient is rotated. Stable cardiac enlargement. Tortuous atherosclerotic aorta. Pulmonary hyperinflation with chronic bronchitic changes. Nodular 2.3 cm paramedian left upper lobe opacity which may represent artifact due to patient rotation. Levoconvex curvature of the thoracolumbar spine. Diffuse demineralization of bone. IMPRESSION: Nodular 2.3 cm left upper lobe opacity which may represent artifact due to patient rotation, consider further evaluation with chest CT. Electronically Signed   By: Dahlia Bailiff MD   On: 01/19/2021 01:50   DG Knee Complete 4 Views Left  Result Date: 01/18/2021 CLINICAL DATA:  Status post fall. EXAM: LEFT KNEE - COMPLETE 4+ VIEW COMPARISON:  May 22, 2008 FINDINGS: No evidence of an acute fracture or dislocation. Moderate severity medial and lateral tibiofemoral compartment space narrowing is seen. Mild vascular calcification is noted. A very small joint effusion is seen. IMPRESSION: 1. Degenerative changes without an acute osseous abnormality. 2. Very small joint effusion. Electronically Signed   By: Virgina Norfolk M.D.   On: 01/18/2021 22:41   DG Hip Unilat With Pelvis 2-3 Views Left  Result Date: 01/19/2021 CLINICAL DATA:  Fall, left hip pain EXAM: DG HIP (WITH OR WITHOUT PELVIS) 2-3V LEFT COMPARISON:  None. FINDINGS: Single view radiograph pelvis and two view radiograph left hip demonstrates an acute, comminuted, intratrochanteric left hip fracture with 1/2 shaft with lateral displacement and moderate varus angulation of the distal fracture fragment as well as avulsion of the lesser trochanter. The  left femoral head is still seated within the left acetabulum. Left hip joint space appears preserved. The pelvis and visualized right hip appear intact. IMPRESSION: Acute, comminuted, displaced, angulated left intratrochanteric femoral neck fracture with avulsion of the lesser trochanter. Electronically Signed   By: Fidela Salisbury MD   On: 01/19/2021 01:04       Assessment & Plan:    Active Problems:   Hip fracture ([place])   1. Hip fracture 1. Pain control with pain scale 2. N.p.o. 3. IV fluid hydration 4. Type and screen 5. Plan for surgery on Wednesday 2. Prolonged QTC 1. Monitor on telemetry 2. Check and replace electrolytes as needed 3. Avoid QT prolonging agents when possible 3. Anxiety 1. Continue Zoloft and Klonopin 4. Hypertension 1. Continue ACE and Norvasc 5. Hyperlipidemia 1. Continue statin   DVT Prophylaxis-   SCDs   AM Labs Ordered, also please review Full Orders  Family Communication: Admission, patients condition and plan of care including tests being ordered have been discussed with the patient and daughter who indicate understanding and agree with the plan and Code Status.  Code Status: DNR Admission status: Inpatient :The appropriate admission status for this patient is INPATIENT. Inpatient status is judged to be reasonable and necessary in order to provide the required intensity  of service to ensure the patient's safety. The patient's presenting symptoms, physical exam findings, and initial radiographic and laboratory data in the context of their chronic comorbidities is felt to place them at high risk for further clinical deterioration. Furthermore, it is not anticipated that the patient will be medically stable for discharge from the hospital within 2 midnights of admission. The following factors support the admission status of inpatient.     The patient's presenting symptoms include fall and knee pain The worrisome physical exam findings include frog-leg  position of the left leg The initial radiographic and laboratory data are worrisome because of intertrochanteric fracture The chronic co-morbidities include hyperlipidemia, hypertension       * I certify that at the point of admission it is my clinical judgment that the patient will require inpatient hospital care spanning beyond 2 midnights from the point of admission due to high intensity of service, high risk for further deterioration and high frequency of surveillance required.*  Time spent in minutes : [place]

## 2021-01-19 NOTE — Consult Note (Signed)
ORTHOPAEDIC CONSULTATION  REQUESTING PHYSICIAN: Heath Lark D, DO  ASSESSMENT AND PLAN: 87 y.o. female with the following: Left Hip Intertrochanteric femur fracture  This patient requires inpatient admission to the hospitalist, to include preoperative clearance and perioperative medical management  - Weight Bearing Status/Activity: NWB Left lower extremity  - Additional recommended labs/tests: Preop Labs: CBC, BMP, PT/INR, Chest XR and EKG  -VTE Prophylaxis: Please hold prior to OR; to resume POD#1 at the discretion of the primary team  - Pain control: Recommend PO pain medications PRN; judicious use of narcotics  - Follow-up plan: F/u 10-14 days postop  -Procedures: Plan for OR once patient has been medically optimized  Plan for Left Hip Cephalomedullary nail   Plan for OR 01/20/21, ok to resume appropriate diet.  Please keep patient NPO at midnight.    Will discuss surgical plan with the patient's daughter as the patient is hard of hearing, and it is difficult to communicate with her directly.   Risks and benefits of surgery, including, but not limited to infection, bleeding, persistent pain, damage to surrounding structures, need for further surgery, malunion, nonunion and more severe complications associated with anesthesia were discussed.  All questions have been answered and the patient's daughter elected to proceed with surgery.  Surgical consent will be finalized before surgery.     Chief Complaint: Left hip pain  HPI: Amy Sawyer is a 87 y.o. female who presented to the ED for evaluation after sustaining a mechanical fall.  She was in her usual state of health yesterday, when she fell.  According to her daughter, her lives across the street, she was putting groceries away when she tripped and fell on to her left side.  It is unclear if she hit her head, but she did not lose consciousness.  She is complaining of pain in her left knee.  Pain worsens with movement of  her left hip.  At baseline, she uses a walker to assist with ambulation and has issues with her balance.  Patient lives with her husband.  Additional information is limited as there is no family at the bedside.  Most of this information was gleaned from the chart.   Past Medical History:  Diagnosis Date  . HOH (hard of hearing)   . HTN (hypertension)   . Hyperlipemia   . Stroke [facility])    Past Surgical History:  Procedure Laterality Date  . BREAST SURGERY    . LEG SURGERY     Social History   Socioeconomic History  . Marital status: Married    Spouse name: Not on file  . Number of children: Not on file  . Years of education: 47  . Highest education level: Not on file  Occupational History  . Not on file  Tobacco Use  . Smoking status: Never Smoker  . Smokeless tobacco: Never Used  Vaping Use  . Vaping Use: Never used  Substance and Sexual Activity  . Alcohol use: No  . Drug use: No  . Sexual activity: Not Currently    Birth control/protection: Post-menopausal  Other Topics Concern  . Not on file  Social History Narrative  . Not on file   Social Determinants of Health   Financial Resource Strain: Not on file  Food Insecurity: No Food Insecurity  . Worried About Charity fundraiser in the Last Year: Never true  . Ran Out of Food in the Last Year: Never true  Transportation Needs: No Transportation Needs  . Lack  of Transportation (Medical): No  . Lack of Transportation (Non-Medical): No  Physical Activity: Inactive  . Days of Exercise per Week: 0 days  . Minutes of Exercise per Session: 20 min  Stress: No Stress Concern Present  . Feeling of Stress : Only a little  Social Connections: Moderately Isolated  . Frequency of Communication with Friends and Family: Twice a week  . Frequency of Social Gatherings with Friends and Family: Twice a week  . Attends Religious Services: Never  . Active Member of Clubs or Organizations: No  . Attends Archivist  Meetings: Never  . Marital Status: Married   Family History  Problem Relation Age of Onset  . Heart disease Other   . Diabetes Other   . Stroke Mother    Allergies  Allergen Reactions  . Demerol [Meperidine] Anaphylaxis  . Hydrocodone-Acetaminophen Nausea And Vomiting   Prior to Admission medications   Medication Sig Start Date End Date Taking? Authorizing Provider  acetaminophen (TYLENOL) 500 MG tablet Take 500 mg by mouth every 6 (six) hours as needed for mild pain or headache.   Yes [provider]  amLODipine (NORVASC) 5 MG tablet Take 5 mg by mouth daily.   Yes [provider]  aspirin EC 325 MG EC tablet Take 1 tablet (325 mg total) by mouth daily. 02/21/18  Yes Johnson, Clanford L, MD  clonazePAM (KLONOPIN) 0.5 MG tablet Take 0.25 mg by mouth 2 (two) times daily as needed for anxiety.   Yes [provider]  polyethylene glycol (MIRALAX) 17 g packet Take 17 g by mouth daily. 07/17/20  Yes Gareth Morgan, MD  ramipril (ALTACE) 10 MG capsule Take 10 mg by mouth daily.   Yes [provider]  trolamine salicylate (ASPERCREME) 10 % cream Apply 1 application topically 2 (two) times daily as needed for muscle pain.   Yes [provider]  oxyCODONE-acetaminophen (PERCOCET/ROXICET) 5-325 MG tablet Take 1 tablet by mouth every 6 (six) hours as needed. Patient not taking: Reported on 01/19/2021 05/28/20   Carole Civil, MD   DG Chest Port 1 View  Result Date: 01/19/2021 CLINICAL DATA:  Preop examination for left hip fracture EXAM: PORTABLE CHEST 1 VIEW COMPARISON:  July 17, 2020 FINDINGS: Patient is rotated. Stable cardiac enlargement. Tortuous atherosclerotic aorta. Pulmonary hyperinflation with chronic bronchitic changes. Nodular 2.3 cm paramedian left upper lobe opacity which may represent artifact due to patient rotation. Levoconvex curvature of the thoracolumbar spine. Diffuse demineralization of bone. IMPRESSION: Nodular 2.3 cm left  upper lobe opacity which may represent artifact due to patient rotation, consider further evaluation with chest CT. Electronically Signed   By: Dahlia Bailiff MD   On: 01/19/2021 01:50   DG Knee Complete 4 Views Left  Result Date: 01/18/2021 CLINICAL DATA:  Status post fall. EXAM: LEFT KNEE - COMPLETE 4+ VIEW COMPARISON:  May 22, 2008 FINDINGS: No evidence of an acute fracture or dislocation. Moderate severity medial and lateral tibiofemoral compartment space narrowing is seen. Mild vascular calcification is noted. A very small joint effusion is seen. IMPRESSION: 1. Degenerative changes without an acute osseous abnormality. 2. Very small joint effusion. Electronically Signed   By: Virgina Norfolk M.D.   On: 01/18/2021 22:41   DG Hip Unilat With Pelvis 2-3 Views Left  Result Date: 01/19/2021 CLINICAL DATA:  Fall, left hip pain EXAM: DG HIP (WITH OR WITHOUT PELVIS) 2-3V LEFT COMPARISON:  None. FINDINGS: Single view radiograph pelvis and two view radiograph left hip demonstrates an acute,  comminuted, intratrochanteric left hip fracture with 1/2 shaft with lateral displacement and moderate varus angulation of the distal fracture fragment as well as avulsion of the lesser trochanter. The left femoral head is still seated within the left acetabulum. Left hip joint space appears preserved. The pelvis and visualized right hip appear intact. IMPRESSION: Acute, comminuted, displaced, angulated left intratrochanteric femoral neck fracture with avulsion of the lesser trochanter. Electronically Signed   By: Fidela Salisbury MD   On: 01/19/2021 01:04   Family History Reviewed and non-contributory, no pertinent history of problems with bleeding or anesthesia    Review of Systems Limited due to communication issues No fevers or chills No shortness of breath Complaining of thirst.      OBJECTIVE  Vitals: Patient Vitals for the past 8 hrs:  BP Temp Temp src Pulse SpO2 Weight  01/19/21 0332 (!) 135/59  (!) 97.5 F (36.4 C) Oral 71 94 % 47.1 kg  01/19/21 0200 126/66 -- -- -- -- --   General: Responds to my voice, complaining of thirst.  Hard of hearing, does not answer all of my questions.  Cardiovascular: Extremities are warm Respiratory: No cyanosis, no use of accessory musculature Skin: No lesions in the area of chief complaint  Neurologic: responds to light touch distally.   Psychiatric: Patient is aware of what is happening, but cannot communicate with her because of her hearing issues Lymphatic: No swelling obvious and reported other than the area involved in the exam below Extremities  LLE: Extremity held in a fixed position.  ROM deferred due to known fracture.  Responds to light touch in all nerves distributions. 2+ DP pulse.  Toes are WWP.  Active motion intact in the TA/EHL/GS. RLE: Responds to light touch. 2+ DP pulse.  Toes are WWP.  Active motion intact in the TA/EHL/GS. Tolerates gentle ROM of the hip.  No pain with axial loading.     Test Results Imaging XR of the Left hip demonstrates a comminuted Intertrochanteric femur fracture.  XR of the left knee demonstrates no acute injury.  No prosthesis.  Degenerative changes noted throughout her knee.   Labs cbc Recent Labs    01/18/21 0007 01/19/21 0641  WBC 9.7 6.9  HGB 12.8 10.9*  HCT 38.4 33.5*  PLT 191 171    Labs inflam No results for input(s): CRP in the last 72 hours.  Invalid input(s): ESR  Labs coag No results for input(s): INR, PTT in the last 72 hours.  Invalid input(s): PT  Recent Labs    01/18/21 0007 01/19/21 0641  NA 136 132*  K 4.0 3.9  CL 99 99  CO2 26 26  GLUCOSE 129* 119*  BUN 14 14  CREATININE 0.83 0.71  CALCIUM 8.9 8.5*

## 2021-01-19 NOTE — ED Provider Notes (Signed)
Care assumed from Dr. Eulis Foster, patient with mechanical fall at home and intertrochanteric left hip fracture, will need to be admitted for surgical management.  Basic metabolic panel is still pending.  Basic metabolic panel is significant for minimally elevated glucose.  Case has been discussed with Dr. Amedeo Kinsman, on-call for orthopedic surgery, who agrees to see the patient in consultation.  Case is discussed with Dr. Clearence Ped of [facility], who agrees to admit the patient.   Results for orders placed or performed during the hospital encounter of 42/59/56  Basic metabolic panel  Result Value Ref Range   Sodium 136 135 - 145 mmol/L   Potassium 4.0 3.5 - 5.1 mmol/L   Chloride 99 98 - 111 mmol/L   CO2 26 22 - 32 mmol/L   Glucose, Bld 129 (H) 70 - 99 mg/dL   BUN 14 8 - 23 mg/dL   Creatinine, Ser 0.83 0.44 - 1.00 mg/dL   Calcium 8.9 8.9 - 10.3 mg/dL   GFR, Estimated >60 >60 mL/min   Anion gap 11 5 - 15  CBC with Differential  Result Value Ref Range   WBC 9.7 4.0 - 10.5 K/uL   RBC 4.07 3.87 - 5.11 MIL/uL   Hemoglobin 12.8 12.0 - 15.0 g/dL   HCT 38.4 36.0 - 46.0 %   MCV 94.3 80.0 - 100.0 fL   MCH 31.4 26.0 - 34.0 pg   MCHC 33.3 30.0 - 36.0 g/dL   RDW 13.2 11.5 - 15.5 %   Platelets 191 150 - 400 K/uL   nRBC 0.0 0.0 - 0.2 %   Neutrophils Relative % 88 %   Neutro Abs 8.4 (H) 1.7 - 7.7 K/uL   Lymphocytes Relative 6 %   Lymphs Abs 0.6 (L) 0.7 - 4.0 K/uL   Monocytes Relative 5 %   Monocytes Absolute 0.5 0.1 - 1.0 K/uL   Eosinophils Relative 0 %   Eosinophils Absolute 0.0 0.0 - 0.5 K/uL   Basophils Relative 1 %   Basophils Absolute 0.1 0.0 - 0.1 K/uL   Immature Granulocytes 0 %   Abs Immature Granulocytes 0.04 0.00 - 0.07 K/uL   DG Knee Complete 4 Views Left  Result Date: 01/18/2021 CLINICAL DATA:  Status post fall. EXAM: LEFT KNEE - COMPLETE 4+ VIEW COMPARISON:  May 22, 2008 FINDINGS: No evidence of an acute fracture or dislocation. Moderate severity medial and lateral  tibiofemoral compartment space narrowing is seen. Mild vascular calcification is noted. A very small joint effusion is seen. IMPRESSION: 1. Degenerative changes without an acute osseous abnormality. 2. Very small joint effusion. Electronically Signed   By: Virgina Norfolk M.D.   On: 01/18/2021 22:41   DG Hip Unilat With Pelvis 2-3 Views Left  Result Date: 01/19/2021 CLINICAL DATA:  Fall, left hip pain EXAM: DG HIP (WITH OR WITHOUT PELVIS) 2-3V LEFT COMPARISON:  None. FINDINGS: Single view radiograph pelvis and two view radiograph left hip demonstrates an acute, comminuted, intratrochanteric left hip fracture with 1/2 shaft with lateral displacement and moderate varus angulation of the distal fracture fragment as well as avulsion of the lesser trochanter. The left femoral head is still seated within the left acetabulum. Left hip joint space appears preserved. The pelvis and visualized right hip appear intact. IMPRESSION: Acute, comminuted, displaced, angulated left intratrochanteric femoral neck fracture with avulsion of the lesser trochanter. Electronically Signed   By: Fidela Salisbury MD   On: 38/75/6433 29:51      Delora Fuel, MD 88/41/66 [phone]

## 2021-01-19 NOTE — Progress Notes (Signed)
Initial Nutrition Assessment  DOCUMENTATION CODES:  Severe malnutrition in context of chronic illness  INTERVENTION:  Add Boost Plus po TID, each supplement provides 360 kcal and 14 grams of protein.  Add 30 ml ProSource Plus po BID, each supplement provides 100 kcal and 15 grams of protein.   Add MVI with minerals daily.  NUTRITION DIAGNOSIS:  Severe Malnutrition related to chronic illness as evidenced by moderate fat depletion,severe fat depletion,severe muscle depletion,energy intake < or equal to 75% for > or equal to 1 month,percent weight loss.  GOAL:  Patient will meet greater than or equal to 90% of their needs  MONITOR:  PO intake,Supplement acceptance,Labs,Weight trends,Skin,I & O's  REASON FOR ASSESSMENT:  Malnutrition Screening Tool    ASSESSMENT:  87 yo female with a PMH of stroke, HLD, HTN, and who is extremely hard of hearing presents with L intertrochanteric hip fracture s/p mechanical fall. 5/18 - plan for procedure for hip fracture  Spoke with pt's daughter and grandson. They report that she has a decreased appetite for about 3 years now since her grandson moved in with her 4 years ago. Daughter reports that she eats breakfast, snacks on sweets throughout the day, and eats vegetables or a hamburger for dinner. She also reports that pt does not eat much meat and her PCP recommended more protein intake.  Daughter and grandson endorse weight loss. She weighed 109 lbs yesterday on the scale at home. She weighed 120 lbs 3 years ago and has steadily lost weight to current weight since then. Per Epic, pt weighed 126 lbs in 05/2020 and now weighs 109 lbs, which is a 17 lb wt loss (13.4%) in 8 months, which is significant and severe.  On exam, pt has very little muscle and fat stores. Given above criteria, pt is severely malnourished in chronic setting.  Recommend Boost Plus TID, ProSource BID, and MVI with minerals daily. Also, upon discharge, advised daughter and grandson  for pt to drink 3 Boosts per day for at least 1 month and start taking a senior multivitamin.  NUTRITION - FOCUSED PHYSICAL EXAM: Flowsheet Row Most Recent Value  Orbital Region Severe depletion  Upper Arm Region Severe depletion  Thoracic and Lumbar Region Moderate depletion  Buccal Region Severe depletion  Temple Region Severe depletion  Clavicle Bone Region Severe depletion  Clavicle and Acromion Bone Region Severe depletion  Scapular Bone Region Severe depletion  Dorsal Hand Severe depletion  Patellar Region Severe depletion  Anterior Thigh Region Severe depletion  Posterior Calf Region Severe depletion  Edema (RD Assessment) None  Hair Reviewed  Eyes Reviewed  Mouth Reviewed  Skin Reviewed  Nails Reviewed     Diet Order:   Diet Order            Diet NPO time specified Except for: Sips with Meds  Diet effective midnight           Diet Heart Room service appropriate? Yes; Fluid consistency: Thin  Diet effective now                EDUCATION NEEDS:  Education needs have been addressed  Skin:  Skin Assessment: Reviewed RN Assessment  Last BM:  PTA/unknown  Height:  Ht Readings from Last 1 Encounters:  05/21/20 5\' 1"  (1.549 m)   Weight:  Wt Readings from Last 1 Encounters:  01/19/21 47.1 kg   Ideal Body Weight:  54.5 kg  BMI:  Body mass index is 19.62 kg/m.  Estimated Nutritional Needs:  Kcal:  1500-1700 Protein:  60-75 grams Fluid:  >1.5 L  Derrel Nip, RD, LDN Registered Dietitian After Hours/Weekend Pager # in [place]

## 2021-01-19 NOTE — Progress Notes (Signed)
Amy Sawyer  is a 87 y.o. female, who is extremely hard of hearing, with history of stroke, hyperlipidemia, hypertension presents the ED with a chief complaint of fall.  History is limited by patient has hearing impairment.  She has sustained a left intertrochanteric hip fracture s/p fall. Orthopedics planning to do IM Nail on 5/18.  Patient admitted after midnight and pt examined at bedside and asking for water.  Left intertrochanteric hip fracture s/p mechanical fall -Resume diet for today, then NPO after MN -IVF hydration -Pain management -Orthopedics for IM Nail on 5/18 -Bedrest  Mild hyponatremia -Continue IV normal saline continue to monitor  Anemia-downtrending -No overt bleeding noted -Continue to monitor repeat CBC  Anxiety -Continue Zoloft and Klonopin  Hypertension -Continue ACE inhibitor and Norvasc  Dyslipidemia -Continue statin  Discussed with daughter on phone 5/17.  Total care time: 25 minutes.

## 2021-01-19 NOTE — Evaluation (Signed)
Clinical/Bedside Swallow Evaluation Patient Details  Name: Amy Sawyer MRN: [id number] Date of Birth: 29-Jan-1931  Today's Date: 01/19/2021 Time: SLP Start Time (ACUTE ONLY): 1602 SLP Stop Time (ACUTE ONLY): 1625 SLP Time Calculation (min) (ACUTE ONLY): 23 min  Past Medical History:  Past Medical History:  Diagnosis Date  . HOH (hard of hearing)   . HTN (hypertension)   . Hyperlipemia   . Stroke [facility])    Past Surgical History:  Past Surgical History:  Procedure Laterality Date  . BREAST SURGERY    . LEG SURGERY     HPI:  Amy Sawyer  is a 87 y.o. female, who is extremely hard of hearing, with history of stroke, hyperlipidemia, hypertension presents the ED with a chief complaint of fall.  History is limited by patient has hearing impairment.  She has sustained a left intertrochanteric hip fracture s/p fall. Orthopedics planning to do IM Nail on 5/18.BSE requested due to weight loss and coughing with medication with water earlier today.   Assessment / Plan / Recommendation Clinical Impression  Clinical swallow evaluation completed at bedside. Pt is very HOH. Oral motor examination is WNL. Pt presents with immediate cough at times and delayed throat clearing after sips thin liquids. She also reported that it feels like her throat is burning at times. Pt apparently had trouble swallowing a small pill with water earlier today, which may be causing her some throat discomfort now. Pt assessed with NTL, HTL, and puree which she swallowed without coughing and only one delayed throat clear. Pt masticated the peaches, but then stated that she was unable to swallow. She was offered a liquid wash and cleared her oral cavity without incident. Pt is scheduled for surgery for her hip tomorrow and will be NPO after midnight tonight. Will change diet to D2 and NTL with po medications whole in puree when Pt is alert and upright and supervision. Pt will need follow up dysphagia intervention after her  surgery.   SLP Visit Diagnosis: Dysphagia, unspecified (R13.10)    Aspiration Risk  Mild aspiration risk;Risk for inadequate nutrition/hydration    Diet Recommendation Dysphagia 2 (Fine chop);Nectar-thick liquid   Liquid Administration via: Cup;Straw Medication Administration: Whole meds with puree Supervision: Patient able to self feed;Full supervision/cueing for compensatory strategies Compensations: Slow rate;Small sips/bites Postural Changes: Seated upright at 90 degrees;Remain upright for at least 30 minutes after po intake    Other  Recommendations Oral Care Recommendations: Oral care BID;Staff/trained caregiver to provide oral care Other Recommendations: Order thickener from pharmacy;Prohibited food (jello, ice cream, thin soups);Clarify dietary restrictions   Follow up Recommendations Skilled Nursing facility      Frequency and Duration min 2x/week  1 week       Prognosis Prognosis for Safe Diet Advancement: Fair      Swallow Study   General Date of Onset: 01/18/21 HPI: Amy Sawyer  is a 87 y.o. female, who is extremely hard of hearing, with history of stroke, hyperlipidemia, hypertension presents the ED with a chief complaint of fall.  History is limited by patient has hearing impairment.  She has sustained a left intertrochanteric hip fracture s/p fall. Orthopedics planning to do IM Nail on 5/18.BSE requested due to weight loss and coughing with medication with water earlier today. Type of Study: Bedside Swallow Evaluation Previous Swallow Assessment: BSE 2019 D3/thin Diet Prior to this Study: Regular;Thin liquids Temperature Spikes Noted: No Respiratory Status: Room air History of Recent Intubation: No Behavior/Cognition: Alert;Cooperative;Pleasant mood Oral Cavity Assessment: Within Functional  Limits Oral Care Completed by SLP: Yes Oral Cavity - Dentition: Adequate natural dentition Vision: Functional for self-feeding Self-Feeding Abilities: Able to feed  self;Needs set up Patient Positioning: Upright in bed Baseline Vocal Quality: Normal Volitional Cough: Strong Volitional Swallow: Able to elicit    Oral/Motor/Sensory Function Overall Oral Motor/Sensory Function: Within functional limits   Ice Chips Ice chips: Within functional limits Presentation: Spoon   Thin Liquid Thin Liquid: Impaired Presentation: Cup;Straw;Self Fed;Spoon Pharyngeal  Phase Impairments: Throat Clearing - Immediate;Throat Clearing - Delayed;Cough - Immediate    Nectar Thick Nectar Thick Liquid: Within functional limits Presentation: Cup;Self Fed;Straw Pharyngeal Phase Impairments: Throat Clearing - Delayed   Honey Thick Honey Thick Liquid: Within functional limits Presentation: Cup;Straw;Self fed   Puree Puree: Within functional limits Presentation: Spoon   Solid     Solid: Impaired Presentation: Self Fed Oral Phase Impairments: Impaired mastication Oral Phase Functional Implications: Oral holding;Impaired mastication;Prolonged oral transit Other Comments: Pt stated that she couldn't swallow, but did and benefited from liquid wash     Thank you,  Genene Churn, [place] 01/19/2021,4:52 PM

## 2021-01-20 ENCOUNTER — Inpatient Hospital Stay (HOSPITAL_COMMUNITY): Payer: Medicare Other | Admitting: Certified Registered"

## 2021-01-20 ENCOUNTER — Encounter (HOSPITAL_COMMUNITY): Payer: Self-pay | Admitting: Family Medicine

## 2021-01-20 ENCOUNTER — Inpatient Hospital Stay (HOSPITAL_COMMUNITY): Payer: Medicare Other

## 2021-01-20 ENCOUNTER — Encounter (HOSPITAL_COMMUNITY)
Admission: EM | Disposition: A | Discharge status: Skilled Nursing Facility | Payer: Self-pay | Source: Home / Self Care | Attending: Internal Medicine

## 2021-01-20 HISTORY — PX: INTRAMEDULLARY (IM) NAIL INTERTROCHANTERIC: SHX5875

## 2021-01-20 LAB — BASIC METABOLIC PANEL
Anion gap: 7 (ref 5–15)
BUN: 16 mg/dL (ref 8–23)
CO2: 25 mmol/L (ref 22–32)
Calcium: 8.5 mg/dL — ABNORMAL LOW (ref 8.9–10.3)
Chloride: 99 mmol/L (ref 98–111)
Creatinine, Ser: 0.63 mg/dL (ref 0.44–1.00)
GFR, Estimated: >60 mL/min (ref >60)
Glucose, Bld: 123 mg/dL — ABNORMAL HIGH (ref 70–99)
Potassium: 3.8 mmol/L (ref 3.5–5.1)
Sodium: 131 mmol/L — ABNORMAL LOW (ref 135–145)

## 2021-01-20 LAB — MAGNESIUM: Magnesium: 1.9 mg/dL (ref 1.7–2.4)

## 2021-01-20 LAB — CBC
HCT: 31.2 % — ABNORMAL LOW (ref 36.0–46.0)
Hemoglobin: 10.2 g/dL — ABNORMAL LOW (ref 12.0–15.0)
MCH: 31.2 pg (ref 26.0–34.0)
MCHC: 32.7 g/dL (ref 30.0–36.0)
MCV: 95.4 fL (ref 80.0–100.0)
Platelets: 161 10*3/uL (ref 150–400)
RBC: 3.27 MIL/uL — ABNORMAL LOW (ref 3.87–5.11)
RDW: 13.5 % (ref 11.5–15.5)
WBC: 7.2 10*3/uL (ref 4.0–10.5)
nRBC: 0 % (ref 0.0–0.2)

## 2021-01-20 SURGERY — FIXATION, FRACTURE, INTERTROCHANTERIC, WITH INTRAMEDULLARY ROD
Anesthesia: Spinal | Site: Hip | Laterality: Left

## 2021-01-20 MED ORDER — CEFAZOLIN SODIUM-DEXTROSE 2-4 GM/100ML-% IV SOLN
2.0000 g | Freq: Three times a day (TID) | INTRAVENOUS | Status: AC
  Administered 2021-01-20 – 2021-01-21 (×3): 2 g via INTRAVENOUS
  Filled 2021-01-20 (×4): qty 100

## 2021-01-20 MED ORDER — ONDANSETRON HCL 4 MG/2ML IJ SOLN
INTRAMUSCULAR | Status: DC | PRN
  Administered 2021-01-20: 4 mg via INTRAVENOUS

## 2021-01-20 MED ORDER — MIDAZOLAM HCL 5 MG/5ML IJ SOLN
INTRAMUSCULAR | Status: DC | PRN
  Administered 2021-01-20 (×2): 1 mg via INTRAVENOUS

## 2021-01-20 MED ORDER — SODIUM CHLORIDE 0.9 % IR SOLN
Status: DC | PRN
  Administered 2021-01-20: 1000 mL

## 2021-01-20 MED ORDER — ONDANSETRON HCL 4 MG/2ML IJ SOLN: 4.0000 mg | Freq: Once | INTRAMUSCULAR | Status: DC | PRN

## 2021-01-20 MED ORDER — FENTANYL CITRATE (PF) 100 MCG/2ML IJ SOLN
INTRAMUSCULAR | Status: AC
  Filled 2021-01-20: qty 2

## 2021-01-20 MED ORDER — LIDOCAINE HCL (PF) 2 % IJ SOLN
INTRAMUSCULAR | Status: AC
  Filled 2021-01-20: qty 10

## 2021-01-20 MED ORDER — ORAL CARE MOUTH RINSE: 15.0000 mL | Freq: Once | OROMUCOSAL | Status: DC

## 2021-01-20 MED ORDER — PHENYLEPHRINE HCL (PRESSORS) 10 MG/ML IV SOLN
INTRAVENOUS | Status: DC | PRN
  Administered 2021-01-20 (×2): 60 ug via INTRAVENOUS
  Administered 2021-01-20 (×2): 80 ug via INTRAVENOUS

## 2021-01-20 MED ORDER — BUPIVACAINE HCL (PF) 0.5 % IJ SOLN
INTRAMUSCULAR | Status: DC | PRN
  Administered 2021-01-20: 30 mL

## 2021-01-20 MED ORDER — MIDAZOLAM HCL 2 MG/2ML IJ SOLN
INTRAMUSCULAR | Status: AC
  Filled 2021-01-20: qty 2

## 2021-01-20 MED ORDER — CEFAZOLIN SODIUM-DEXTROSE 2-4 GM/100ML-% IV SOLN
INTRAVENOUS | Status: AC
  Filled 2021-01-20: qty 100

## 2021-01-20 MED ORDER — PROPOFOL 10 MG/ML IV BOLUS
INTRAVENOUS | Status: AC
  Filled 2021-01-20: qty 20

## 2021-01-20 MED ORDER — SEVOFLURANE IN SOLN
RESPIRATORY_TRACT | Status: AC
  Filled 2021-01-20: qty 250

## 2021-01-20 MED ORDER — BUPIVACAINE HCL (PF) 0.5 % IJ SOLN
INTRAMUSCULAR | Status: AC
  Filled 2021-01-20: qty 30

## 2021-01-20 MED ORDER — EPHEDRINE SULFATE 50 MG/ML IJ SOLN
INTRAMUSCULAR | Status: DC | PRN
  Administered 2021-01-20: 10 mg via INTRAVENOUS
  Administered 2021-01-20 (×2): 5 mg via INTRAVENOUS

## 2021-01-20 MED ORDER — FENTANYL CITRATE (PF) 100 MCG/2ML IJ SOLN
INTRAMUSCULAR | Status: DC | PRN
  Administered 2021-01-20 (×4): 25 ug via INTRAVENOUS

## 2021-01-20 MED ORDER — TRANEXAMIC ACID-NACL 1000-0.7 MG/100ML-% IV SOLN
INTRAVENOUS | Status: AC
  Filled 2021-01-20: qty 100

## 2021-01-20 MED ORDER — PROPOFOL 10 MG/ML IV BOLUS
INTRAVENOUS | Status: DC | PRN
  Administered 2021-01-20 (×4): 20 mg via INTRAVENOUS

## 2021-01-20 MED ORDER — CHLORHEXIDINE GLUCONATE 0.12 % MT SOLN: 15.0000 mL | Freq: Once | OROMUCOSAL | Status: DC

## 2021-01-20 MED ORDER — LACTATED RINGERS IV SOLN: INTRAVENOUS | Status: DC

## 2021-01-20 MED ORDER — PROPOFOL 500 MG/50ML IV EMUL
INTRAVENOUS | Status: DC | PRN
  Administered 2021-01-20: 20 ug/kg/min via INTRAVENOUS

## 2021-01-20 MED ORDER — BUPIVACAINE IN DEXTROSE 0.75-8.25 % IT SOLN
INTRATHECAL | Status: DC | PRN
  Administered 2021-01-20: 1.5 mg via INTRATHECAL

## 2021-01-20 MED ORDER — FENTANYL CITRATE (PF) 100 MCG/2ML IJ SOLN: 25.0000 ug | INTRAMUSCULAR | Status: DC | PRN

## 2021-01-20 MED ORDER — PHENYLEPHRINE 40 MCG/ML (10ML) SYRINGE FOR IV PUSH (FOR BLOOD PRESSURE SUPPORT)
PREFILLED_SYRINGE | INTRAVENOUS | Status: AC
  Filled 2021-01-20: qty 10

## 2021-01-20 MED ORDER — CHLORHEXIDINE GLUCONATE CLOTH 2 % EX PADS
6.0000 | MEDICATED_PAD | Freq: Every day | CUTANEOUS | Status: DC
  Administered 2021-01-21 – 2021-01-22 (×2): 6 via TOPICAL

## 2021-01-20 MED ORDER — CEFAZOLIN SODIUM-DEXTROSE 1-4 GM/50ML-% IV SOLN: 1.0000 g | Freq: Three times a day (TID) | INTRAVENOUS | Status: DC

## 2021-01-20 SURGICAL SUPPLY — 59 items
APL PRP STRL LF DISP 70% ISPRP (MISCELLANEOUS) ×1
BIT DRILL 4.0X280 (BIT) ×2 IMPLANT
BLADE SURG SZ10 CARB STEEL (BLADE) ×4 IMPLANT
BNDG GAUZE ELAST 4 BULKY (GAUZE/BANDAGES/DRESSINGS) ×2 IMPLANT
BRUSH SCRUB EZ W/ULTRADEX 3%PC (MISCELLANEOUS) ×2 IMPLANT
CHLORAPREP W/TINT 26 (MISCELLANEOUS) ×2 IMPLANT
CLOTH BEACON ORANGE TIMEOUT ST (SAFETY) ×2 IMPLANT
COVER LIGHT HANDLE STERIS (MISCELLANEOUS) ×4 IMPLANT
COVER MAYO STAND XLG (MISCELLANEOUS) ×2 IMPLANT
COVER PERINEAL POST (MISCELLANEOUS) ×2 IMPLANT
COVER WAND RF STERILE (DRAPES) ×2 IMPLANT
DECANTER SPIKE VIAL GLASS SM (MISCELLANEOUS) ×2 IMPLANT
DRAPE STERI IOBAN 125X83 (DRAPES) ×2 IMPLANT
DRSG MEPILEX SACRM 8.7X9.8 (GAUZE/BANDAGES/DRESSINGS) ×2 IMPLANT
DRSG PAD ABDOMINAL 8X10 ST (GAUZE/BANDAGES/DRESSINGS) ×2 IMPLANT
DRSG TEGADERM 4X4.75 (GAUZE/BANDAGES/DRESSINGS) ×8 IMPLANT
DRSG XEROFORM 1X8 (GAUZE/BANDAGES/DRESSINGS) ×2 IMPLANT
ELECT REM PT RETURN 9FT ADLT (ELECTROSURGICAL) ×2
ELECTRODE REM PT RTRN 9FT ADLT (ELECTROSURGICAL) ×1 IMPLANT
GAUZE SPONGE 4X4 12PLY STRL (GAUZE/BANDAGES/DRESSINGS) ×2 IMPLANT
GAUZE XEROFORM 1X8 LF (GAUZE/BANDAGES/DRESSINGS) ×4 IMPLANT
GLOVE SKINSENSE NS SZ8.0 LF (GLOVE) ×2
GLOVE SKINSENSE STRL SZ8.0 LF (GLOVE) ×2 IMPLANT
GLOVE SRG 8 PF TXTR STRL LF DI (GLOVE) ×1 IMPLANT
GLOVE SURG UNDER POLY LF SZ7 (GLOVE) ×4 IMPLANT
GLOVE SURG UNDER POLY LF SZ8 (GLOVE) ×2
GOWN STRL REUS W/ TWL XL LVL3 (GOWN DISPOSABLE) ×1 IMPLANT
GOWN STRL REUS W/TWL LRG LVL3 (GOWN DISPOSABLE) ×2 IMPLANT
GOWN STRL REUS W/TWL XL LVL3 (GOWN DISPOSABLE) ×2
GUIDEWIRE BALL NOSE 3.0X900 (WIRE) ×2
GUIDEWIRE ORTH 900X3XBALL NOSE (WIRE) ×1 IMPLANT
INST SET MAJOR BONE (KITS) ×2 IMPLANT
KIT BLADEGUARD II DBL (SET/KITS/TRAYS/PACK) ×2 IMPLANT
KIT TURNOVER CYSTO (KITS) ×2 IMPLANT
MANIFOLD NEPTUNE II (INSTRUMENTS) ×2 IMPLANT
MARKER SKIN DUAL TIP RULER LAB (MISCELLANEOUS) ×2 IMPLANT
NAIL LEFT 11X125X39ES (Nail) ×2 IMPLANT
NAIL LEFT 11X130X39 ES (Nail) ×2 IMPLANT
NEEDLE HYPO 21X1.5 SAFETY (NEEDLE) ×2 IMPLANT
NS IRRIG 1000ML POUR BTL (IV SOLUTION) ×2 IMPLANT
PACK BASIC III (CUSTOM PROCEDURE TRAY) ×2
PACK SRG BSC III STRL LF ECLPS (CUSTOM PROCEDURE TRAY) ×1 IMPLANT
PAD ARMBOARD 7.5X6 YLW CONV (MISCELLANEOUS) ×2 IMPLANT
PENCIL SMOKE EVACUATOR COATED (MISCELLANEOUS) ×2 IMPLANT
PIN GUIDE THRD AR 3.2X330 (PIN) ×2 IMPLANT
SCREW LAG LFT 10.5X85 (Screw) ×2 IMPLANT
SCREW LOCK CORT 5.0X38 (Screw) ×2 IMPLANT
SCREW SOLID LEFTY LAG 10.5X90 (Screw) ×2 IMPLANT
SET BASIN LINEN APH (SET/KITS/TRAYS/PACK) ×2 IMPLANT
SPONGE LAP 18X18 RF (DISPOSABLE) ×4 IMPLANT
STAPLER VISISTAT (STAPLE) ×2 IMPLANT
SUT MON AB 2-0 CT1 36 (SUTURE) ×2 IMPLANT
SUT VIC AB 0 CT1 27 (SUTURE) ×2
SUT VIC AB 0 CT1 27XBRD ANTBC (SUTURE) ×1 IMPLANT
SYR 30ML LL (SYRINGE) ×2 IMPLANT
SYR BULB IRRIG 60ML STRL (SYRINGE) ×4 IMPLANT
TOOL ACTIVATION (INSTRUMENTS) ×2 IMPLANT
TRAY FOLEY MTR SLVR 16FR STAT (SET/KITS/TRAYS/PACK) ×2 IMPLANT
YANKAUER SUCT BULB TIP NO VENT (SUCTIONS) ×2 IMPLANT

## 2021-01-20 NOTE — Anesthesia Preprocedure Evaluation (Addendum)
Anesthesia Evaluation  Patient identified by MRN, date of birth, ID band Patient awake    Reviewed: Allergy & Precautions, H&P , NPO status , Patient's Chart, lab work & pertinent test results, reviewed documented beta blocker date and time   Airway Mallampati: II  TM Distance: >3 FB Neck ROM: full    Dental no notable dental hx.    Pulmonary neg pulmonary ROS,    Pulmonary exam normal breath sounds clear to auscultation       Cardiovascular Exercise Tolerance: Good hypertension, negative cardio ROS   Rhythm:regular Rate:Normal     Neuro/Psych CVA negative psych ROS   GI/Hepatic negative GI ROS, Neg liver ROS,   Endo/Other  negative endocrine ROS  Renal/GU negative Renal ROS  negative genitourinary   Musculoskeletal   Abdominal   Peds  Hematology negative hematology ROS (+)   Anesthesia Other Findings   Reproductive/Obstetrics negative OB ROS                             Anesthesia Physical Anesthesia Plan  ASA: II  Anesthesia Plan: Spinal   Post-op Pain Management:    Induction:   PONV Risk Score and Plan: Ondansetron and Propofol infusion  Airway Management Planned:   Additional Equipment:   Intra-op Plan:   Post-operative Plan:   Informed Consent: I have reviewed the patients History and Physical, chart, labs and discussed the procedure including the risks, benefits and alternatives for the proposed anesthesia with the patient or authorized representative who has indicated his/her understanding and acceptance.     Dental Advisory Given  Plan Discussed with: CRNA  Anesthesia Plan Comments:        Anesthesia Quick Evaluation

## 2021-01-20 NOTE — NC FL2 (Addendum)
[place] LEVEL OF CARE SCREENING TOOL     IDENTIFICATION  Patient Name: Amy Sawyer Birthdate: 1930-11-13 Sex: female Admission Date (Current Location): 01/18/2021  [facility] and [place] Number:  [employer] and Address:  [facility] [address], [place]      Provider Number: [id number]  Attending Physician Name and Address:  Rodena Goldmann, DO  Relative Name and Phone Number:  Deeann Saint (Daughter)   [phone]    Current Level of Care: Hospital Recommended Level of Care: [place] Prior Approval Number:    Date Approved/Denied:   PASRR Number: [id number]  Discharge Plan: SNF    Current Diagnoses: Patient Active Problem List   Diagnosis Date Noted  . Hip fracture ([place]) 01/19/2021  . Vaginal itching 04/10/2020  . Mixed stress and urge urinary incontinence 04/10/2020  . Vulvovaginitis 04/10/2020  . Vaginal atrophy 04/10/2020  . Altered mental status 02/19/2018  . Hyperlipemia 02/19/2018  . Hypertension 02/19/2018  . Fever 02/19/2018  . Acute CVA (cerebrovascular accident) ([place]) 02/19/2018  . Protein-calorie malnutrition, severe 02/19/2018  . Encounter for dental exam and cleaning w/o abnormal findings 06/06/2017  . Hyponatremia 03/22/2014  . Pain in joint, shoulder region 04/09/2012  . Muscle weakness (generalized) 04/09/2012  . Proximal humerus fracture 03/13/2012    Orientation RESPIRATION BLADDER Height & Weight     Self,Time,Situation,Place  Normal Continent Weight: 103 lb 9.9 oz (47 kg) Height:  5\' 1"  (154.9 cm)  BEHAVIORAL SYMPTOMS/MOOD NEUROLOGICAL BOWEL NUTRITION STATUS      Continent Diet (see discharge summary)  AMBULATORY STATUS COMMUNICATION OF NEEDS Skin   Limited Assist   Surgical wounds (left hip)                       Personal Care Assistance Level of Assistance  Bathing,Feeding,Dressing Bathing Assistance: Limited assistance Feeding assistance:  Independent Dressing Assistance: Limited assistance     Functional Limitations Info  Sight,Speech,Hearing Sight Info: Adequate Hearing Info: Adequate HARD OF HEARING Speech Info: Adequate    SPECIAL CARE FACTORS FREQUENCY  PT (By licensed PT)     PT Frequency: 5x/week              Contractures Contractures Info: Not present    Additional Factors Info  Code Status,Allergies,Psychotropic Code Status Info: DNR Allergies Info: Demerol, Hydrocodone-acetaminophen Psychotropic Info: Zoloft, Klonopin         Current Medications (01/20/2021):  This is the current hospital active medication list Current Facility-Administered Medications  Medication Dose Route Frequency Provider Last Rate Last Admin  . [MAR Hold] (feeding supplement) PROSource Plus liquid 30 mL  30 mL Oral BID BM Shah, Pratik D, DO   30 mL at 01/19/21 1524  . 0.9 %  sodium chloride infusion   Intravenous Continuous Zierle-Ghosh, Asia B, DO 50 mL/hr at 01/19/21 2204 New Bag at 01/19/21 2204  . [MAR Hold] acetaminophen (TYLENOL) tablet 650 mg  650 mg Oral Q6H PRN Zierle-Ghosh, Asia B, DO       Or  . [MAR Hold] acetaminophen (TYLENOL) suppository 650 mg  650 mg Rectal Q6H PRN Zierle-Ghosh, Asia B, DO      . [MAR Hold] amLODipine (NORVASC) tablet 2.5 mg  2.5 mg Oral Daily Zierle-Ghosh, Asia B, DO   2.5 mg at 01/19/21 0904  . chlorhexidine (PERIDEX) 0.12 % solution 15 mL  15 mL Mouth/Throat Once Louann Sjogren, MD       Or  .  MEDLINE mouth rinse  15 mL Mouth Rinse Once Louann Sjogren, MD      . Doug Sou Hold] clonazepam [facility]) disintegrating tablet 0.25 mg  0.25 mg Oral BID PRN Zierle-Ghosh, Asia B, DO   0.25 mg at 01/19/21 0948  . [MAR Hold] feeding supplement (ENSURE ENLIVE / ENSURE PLUS) liquid 237 mL  237 mL Oral TID WC Shah, Pratik D, DO      . lactated ringers infusion   Intravenous Continuous Louann Sjogren, MD   New Bag at 01/20/21 1215  . [MAR Hold] morphine 2 MG/ML injection 2 mg  2 mg Intravenous Q2H PRN  Zierle-Ghosh, Asia B, DO   2 mg at 01/20/21 0541  . [MAR Hold] multivitamin with minerals tablet 1 tablet  1 tablet Oral Daily Manuella Ghazi, Pratik D, DO   1 tablet at 01/19/21 1524  . [MAR Hold] ondansetron (ZOFRAN) tablet 4 mg  4 mg Oral Q6H PRN Zierle-Ghosh, Asia B, DO       Or  . [MAR Hold] ondansetron (ZOFRAN) injection 4 mg  4 mg Intravenous Q6H PRN Zierle-Ghosh, Asia B, DO      . [MAR Hold] oxyCODONE (Oxy IR/ROXICODONE) immediate release tablet 5 mg  5 mg Oral Q4H PRN Zierle-Ghosh, Asia B, DO   5 mg at 01/19/21 0948  . [MAR Hold] oxyCODONE-acetaminophen (PERCOCET/ROXICET) 5-325 MG per tablet 1 tablet  1 tablet Oral Q6H PRN Zierle-Ghosh, Asia B, DO      . [MAR Hold] ramipril (ALTACE) capsule 10 mg  10 mg Oral Daily Zierle-Ghosh, Asia B, DO   10 mg at 01/19/21 0948  . [MAR Hold] sertraline (ZOLOFT) tablet 50 mg  50 mg Oral Daily Zierle-Ghosh, Asia B, DO   50 mg at 01/19/21 0905  . [MAR Hold] simvastatin (ZOCOR) tablet 20 mg  20 mg Oral Daily Zierle-Ghosh, Asia B, DO   20 mg at 01/19/21 0904  . [MAR Hold] tranexamic acid (CYKLOKAPRON) IVPB 1,000 mg  1,000 mg Intravenous To OR Mordecai Rasmussen, MD       Facility-Administered Medications Ordered in Other Encounters  Medication Dose Route Frequency Provider Last Rate Last Admin  . bupivacaine 0.75% in dextrose 8.25% (intrathecal) (SENSORCAINE) 0.75-8.25 % injection   Intrathecal Anesthesia Intra-op Ollen Bowl, CRNA   1.5 mg at 01/20/21 1315  . ePHEDrine injection   Intravenous Anesthesia Intra-op Ollen Bowl, CRNA   5 mg at 01/20/21 1418  . fentaNYL (SUBLIMAZE) injection   Intravenous Anesthesia Intra-op Ollen Bowl, CRNA   25 mcg at 01/20/21 1433  . midazolam (VERSED) 5 MG/5ML injection   Intravenous Anesthesia Intra-op Ollen Bowl, CRNA   1 mg at 01/20/21 1433  . ondansetron (ZOFRAN) injection   Intravenous Anesthesia Intra-op Ollen Bowl, CRNA   4 mg at 01/20/21 1320  . phenylephrine (NEO-SYNEPHRINE) injection   Intravenous  Anesthesia Intra-op Ollen Bowl, CRNA   80 mcg at 01/20/21 1412  . propofol (DIPRIVAN) 10 mg/mL bolus/IV push   Intravenous Anesthesia Intra-op Ollen Bowl, CRNA   20 mg at 01/20/21 1433  . propofol (DIPRIVAN) 500 MG/50ML infusion   Intravenous Continuous PRN Ollen Bowl, CRNA 2.82 mL/hr at 01/20/21 1348 10 mcg/kg/min at 01/20/21 1348     Discharge Medications: Please see discharge summary for a list of discharge medications.  Relevant Imaging Results:  Relevant Lab Results:   Additional Information SSN [id number] Clydene Pugh, LCSW

## 2021-01-20 NOTE — Plan of Care (Signed)

## 2021-01-20 NOTE — Progress Notes (Signed)
   ORTHOPAEDIC PROGRESS NOTE  Scheduled Procedure(s): INTRAMEDULLARY (IM) NAIL INTERTROCHANTERIC; CMN for L IT fracture  DOS: 01/20/21  SUBJECTIVE: No issues over night.  Speech eval yesterday for dysphagia.  No family at bedside  OBJECTIVE: PE:  Resting comfortably, no acute distress; thin elderly female  Left hip pain with attempted movement Toes are warm and well perfused Active motion of great toe and ankle witnessed Responds to light touch   Vitals:   01/19/21 2021 01/20/21 0338  BP: (!) 134/58 (!) 131/56  Pulse: (!) 47 89  Resp: 18 18  Temp: 98.8 F (37.1 C) 98.5 F (36.9 C)  SpO2: 90% 90%     ASSESSMENT: Amy Sawyer is a 87 y.o. female doing well, stable; NPO since midnight   PLAN: Weightbearing: NWB LLE Insicional and dressing care: Reinforce dressings as needed ; None currently Orthopedic device(s): None VTE prophylaxis: to resume POD#1  Pain control: PO medications PRN, limit sedating medications Follow - up plan: 2 weeks postop   Contact information:     Alver Leete A. Amedeo Kinsman, MD [facility] [address] [place],  [place]  [address] Phone: [phone] Fax: [phone]

## 2021-01-20 NOTE — Progress Notes (Signed)
PROGRESS NOTE    Amy Sawyer  PPJ:[id number] DOB: [birth year] DOA: 01/18/2021 PCP: Asencion Noble, MD   Brief Narrative:   [age] y.o.female,who is extremely hard of hearing, with history of stroke, hyperlipidemia, hypertension presents the ED with a chief complaint of fall. History is limited by patient has hearing impairment.  She has sustained a left intertrochanteric hip fracture s/p fall. Orthopedics planning to do IM Nail on 5/18.  Assessment & Plan:   Active Problems:   Hip fracture (HCC)   Left intertrochanteric hip fracture s/p mechanical fall - NPO after MN, plan for dys 2 diet after surgery today -IVF hydration -Pain management -Orthopedics for IM Nail on 5/18 -Bedrest -PT eval starting 5/19  Mild hyponatremia-stable -Continue IV normal saline continue to monitor  Anemia-stable -No overt bleeding noted -Continue to monitor repeat CBC  Anxiety -Continue Zoloft and Klonopin  Hypertension-controlled -Continue ACE inhibitor and Norvasc  Dyslipidemia -Continue statin  Severe malnutrition -Dietician recommendations as noted   DVT prophylaxis:SCDs Code Status: DNR Family Communication: Discussed with daughter 5/17 Disposition Plan:  Status is: Inpatient  Remains inpatient appropriate because:Ongoing diagnostic testing needed not appropriate for outpatient work up, Unsafe d/c plan, IV treatments appropriate due to intensity of illness or inability to take PO and Inpatient level of care appropriate due to severity of illness   Dispo: The patient is from: Home              Anticipated d/c is to: SNF              Patient currently is not medically stable to d/c.   Difficult to place patient No   Nutritional Assessment:  The patient's BMI is: Body mass index is 19.62 kg/m.Marland Kitchen  Seen by dietician.  I agree with the assessment and plan as outlined below:  Nutrition Status: Nutrition Problem: Severe Malnutrition Etiology: chronic  illness Signs/Symptoms: moderate fat depletion,severe fat depletion,severe muscle depletion,energy intake < or equal to 75% for > or equal to 1 month,percent weight loss Percent weight loss: 13.4 % Interventions: Boost Plus,Prostat,MVI   Consultants:   Orthopedics  Procedures:   IM Nail 5/18  See below  Antimicrobials:  Anti-infectives (From admission, onward)   Start     Dose/Rate Route Frequency Ordered Stop   01/20/21 1200  ceFAZolin (ANCEF) IVPB 2g/100 mL premix        2 g 200 mL/hr over 30 Minutes Intravenous On call to O.R. 01/19/21 1023 01/21/21 0559       Subjective: Patient seen and evaluated today with no acute concerns or events noted overnight. She appears to be resting comfortably and is awaiting her procedure today.  Objective: Vitals:   01/19/21 0332 01/19/21 1430 01/19/21 2021 01/20/21 0338  BP: (!) 135/59 (!) 104/45 (!) 134/58 (!) 131/56  Pulse: 71 73 (!) 47 89  Resp:  16 18 18   Temp: (!) 97.5 F (36.4 C) 98.3 F (36.8 C) 98.8 F (37.1 C) 98.5 F (36.9 C)  TempSrc: Oral Oral    SpO2: 94% 91% 90% 90%  Weight: 47.1 kg       Intake/Output Summary (Last 24 hours) at 01/20/2021 5809 Last data filed at 01/19/2021 1700 Gross per 24 hour  Intake 0 ml  Output --  Net 0 ml   Filed Weights   01/19/21 0332  Weight: 47.1 kg    Examination:  General exam: Appears calm and comfortable, somnolent, hard of hearing  Respiratory system: Clear to auscultation. Respiratory effort normal. Cardiovascular system: S1 &  S2 heard, RRR.  Gastrointestinal system: Abdomen is soft Central nervous system: somnolent Extremities: No edema Skin: No significant lesions noted Psychiatry: Flat affect.    Data Reviewed: I have personally reviewed following labs and imaging studies  CBC: Recent Labs  Lab 01/18/21 0007 01/19/21 0641 01/20/21 0635  WBC 9.7 6.9 7.2  NEUTROABS 8.4*  --   --   HGB 12.8 10.9* 10.2*  HCT 38.4 33.5* 31.2*  MCV 94.3 93.8 95.4  PLT 191  171 481   Basic Metabolic Panel: Recent Labs  Lab 01/18/21 0007 01/19/21 0641 01/20/21 0635  NA 136 132* 131*  K 4.0 3.9 3.8  CL 99 99 99  CO2 26 26 25   GLUCOSE 129* 119* 123*  BUN 14 14 16   CREATININE 0.83 0.71 0.63  CALCIUM 8.9 8.5* 8.5*  MG  --   --  1.9   GFR: CrCl cannot be calculated (Unknown ideal weight.). Liver Function Tests: Recent Labs  Lab 01/19/21 0641  AST 20  ALT 12  ALKPHOS 41  BILITOT 0.7  PROT 5.8*  ALBUMIN 3.5   No results for input(s): LIPASE, AMYLASE in the last 168 hours. No results for input(s): AMMONIA in the last 168 hours. Coagulation Profile: No results for input(s): INR, PROTIME in the last 168 hours. Cardiac Enzymes: No results for input(s): CKTOTAL, CKMB, CKMBINDEX, TROPONINI in the last 168 hours. BNP (last 3 results) No results for input(s): PROBNP in the last 8760 hours. HbA1C: No results for input(s): HGBA1C in the last 72 hours. CBG: No results for input(s): GLUCAP in the last 168 hours. Lipid Profile: No results for input(s): CHOL, HDL, LDLCALC, TRIG, CHOLHDL, LDLDIRECT in the last 72 hours. Thyroid Function Tests: No results for input(s): TSH, T4TOTAL, FREET4, T3FREE, THYROIDAB in the last 72 hours. Anemia Panel: No results for input(s): VITAMINB12, FOLATE, FERRITIN, TIBC, IRON, RETICCTPCT in the last 72 hours. Sepsis Labs: No results for input(s): PROCALCITON, LATICACIDVEN in the last 168 hours.  Recent Results (from the past 240 hour(s))  Resp Panel by RT-PCR (Flu A&B, Covid) Nasopharyngeal Swab     Status: None   Collection Time: 01/18/21 11:53 PM   Specimen: Nasopharyngeal Swab; Nasopharyngeal(NP) swabs in vial transport medium  Result Value Ref Range Status   SARS Coronavirus 2 by RT PCR NEGATIVE NEGATIVE Final    Comment: (NOTE) SARS-CoV-2 target nucleic acids are NOT DETECTED.  The SARS-CoV-2 RNA is generally detectable in upper respiratory specimens during the acute phase of infection. The  lowest concentration of SARS-CoV-2 viral copies this assay can detect is 138 copies/mL. A negative result does not preclude SARS-Cov-2 infection and should not be used as the sole basis for treatment or other patient management decisions. A negative result may occur with  improper specimen collection/handling, submission of specimen other than nasopharyngeal swab, presence of viral mutation(s) within the areas targeted by this assay, and inadequate number of viral copies(<138 copies/mL). A negative result must be combined with clinical observations, patient history, and epidemiological information. The expected result is Negative.  Fact Sheet for Patients:  EntrepreneurPulse.com.au  Fact Sheet for Healthcare Providers:  IncredibleEmployment.be  This test is no t yet approved or cleared by the [place] FDA and  has been authorized for detection and/or diagnosis of SARS-CoV-2 by FDA under an Emergency Use Authorization (EUA). This EUA will remain  in effect (meaning this test can be used) for the duration of the COVID-19 declaration under Section 564(b)(1) of the Act, 21 U.S.C.section 360bbb-3(b)(1), unless the authorization is  terminated  or revoked sooner.       Influenza A by PCR NEGATIVE NEGATIVE Final   Influenza B by PCR NEGATIVE NEGATIVE Final    Comment: (NOTE) The Xpert Xpress SARS-CoV-2/FLU/RSV plus assay is intended as an aid in the diagnosis of influenza from Nasopharyngeal swab specimens and should not be used as a sole basis for treatment. Nasal washings and aspirates are unacceptable for Xpert Xpress SARS-CoV-2/FLU/RSV testing.  Fact Sheet for Patients: EntrepreneurPulse.com.au  Fact Sheet for Healthcare Providers: IncredibleEmployment.be  This test is not yet approved or cleared by the [place] FDA and has been authorized for detection and/or diagnosis of SARS-CoV-2 by FDA under  an Emergency Use Authorization (EUA). This EUA will remain in effect (meaning this test can be used) for the duration of the COVID-19 declaration under Section 564(b)(1) of the Act, 21 U.S.C. section 360bbb-3(b)(1), unless the authorization is terminated or revoked.  Performed at [facility], [address], [place], [place] [address]   Surgical pcr screen     Status: None   Collection Time: 01/19/21 12:02 PM   Specimen: Nasal Mucosa; Nasal Swab  Result Value Ref Range Status   MRSA, PCR NEGATIVE NEGATIVE Final   Staphylococcus aureus NEGATIVE NEGATIVE Final    Comment: (NOTE) The Xpert SA Assay (FDA approved for NASAL specimens in patients 39 years of age and older), is one component of a comprehensive surveillance program. It is not intended to diagnose infection nor to guide or monitor treatment. Performed at [facility], [address], [place], [place] [address]          Radiology Studies: [facility] Chest [facility] 1 View  Result Date: 01/19/2021 CLINICAL DATA:  Preop examination for left hip fracture EXAM: PORTABLE CHEST 1 VIEW COMPARISON:  July 17, 2020 FINDINGS: Patient is rotated. Stable cardiac enlargement. Tortuous atherosclerotic aorta. Pulmonary hyperinflation with chronic bronchitic changes. Nodular 2.3 cm paramedian left upper lobe opacity which may represent artifact due to patient rotation. Levoconvex curvature of the thoracolumbar spine. Diffuse demineralization of bone. IMPRESSION: Nodular 2.3 cm left upper lobe opacity which may represent artifact due to patient rotation, consider further evaluation with chest CT. Electronically Signed   By: Dahlia Bailiff MD   On: 01/19/2021 01:50   DG Knee Complete 4 Views Left  Result Date: 01/18/2021 CLINICAL DATA:  Status post fall. EXAM: LEFT KNEE - COMPLETE 4+ VIEW COMPARISON:  May 22, 2008 FINDINGS: No evidence of an acute fracture or dislocation. Moderate severity medial and lateral tibiofemoral compartment space narrowing  is seen. Mild vascular calcification is noted. A very small joint effusion is seen. IMPRESSION: 1. Degenerative changes without an acute osseous abnormality. 2. Very small joint effusion. Electronically Signed   By: Virgina Norfolk M.D.   On: 01/18/2021 22:41   DG Hip Unilat With Pelvis 2-3 Views Left  Result Date: 01/19/2021 CLINICAL DATA:  Fall, left hip pain EXAM: DG HIP (WITH OR WITHOUT PELVIS) 2-3V LEFT COMPARISON:  None. FINDINGS: Single view radiograph pelvis and two view radiograph left hip demonstrates an acute, comminuted, intratrochanteric left hip fracture with 1/2 shaft with lateral displacement and moderate varus angulation of the distal fracture fragment as well as avulsion of the lesser trochanter. The left femoral head is still seated within the left acetabulum. Left hip joint space appears preserved. The pelvis and visualized right hip appear intact. IMPRESSION: Acute, comminuted, displaced, angulated left intratrochanteric femoral neck fracture with avulsion of the lesser trochanter. Electronically Signed   By: Fidela Salisbury MD  On: 01/19/2021 01:04        Scheduled Meds: . (feeding supplement) PROSource Plus  30 mL Oral BID BM  . amLODipine  2.5 mg Oral Daily  . feeding supplement  237 mL Oral TID WC  . multivitamin with minerals  1 tablet Oral Daily  . ramipril  10 mg Oral Daily  . sertraline  50 mg Oral Daily  . simvastatin  20 mg Oral Daily   Continuous Infusions: . sodium chloride 50 mL/hr at 01/19/21 2204  .  ceFAZolin (ANCEF) IV    . tranexamic acid       LOS: 1 day    Time spent: 35 minutes    Eveleigh Crumpler Darleen Crocker, DO [facility]  If 7PM-7AM, please contact night-coverage [email or web address] 01/20/2021, 9:22 AM

## 2021-01-20 NOTE — Progress Notes (Signed)
SLP Cancellation Note  Patient Details Name: Amy Sawyer MRN: [id number] DOB: [birth year]   Cancelled treatment:       Reason Eval/Treat Not Completed: Patient not medically ready;Medical issues which prohibited therapy. Pt was just returned to the floor following surgery with instructions to remain flat for 4 hrs, therefore, Pt is unable to be positioned safely for swallowing/PO trials at this time. ST will continue efforts, Thank you  Mekhai Venuto H. Roddie Mc, [place] Speech Language Pathologist    Wende Bushy 01/20/2021, 4:43 PM

## 2021-01-20 NOTE — Anesthesia Procedure Notes (Signed)
Spinal  Patient location during procedure: OR Start time: 01/20/2021 1:15 PM Reason for block: surgical anesthesia Preanesthetic Checklist Completed: patient identified, IV checked, site marked, risks and benefits discussed, surgical consent, monitors and equipment checked, pre-op evaluation and timeout performed Spinal Block Patient position: left lateral decubitus Prep: Betadine Patient monitoring: heart rate, cardiac monitor, continuous pulse ox and blood pressure Approach: left paramedian Location: L3-4 Injection technique: single-shot Needle Needle type: Spinocan  Needle gauge: 22 G Needle length: 9 cm Assessment Sensory level: T8 Events: CSF return Additional Notes  ATTEMPTS:1 TRAY VZ:[id number] TRAY EXPIRATION DATE:02/02/2022

## 2021-01-20 NOTE — Op Note (Signed)
Orthopaedic Surgery Operative Note (CSN: [id number])  Amy Sawyer  1930-12-26 Date of Surgery: 01/20/2021   Diagnoses:  Left intertrochanteric femur fracture  Procedure: Cephalomedullary nail for Left intertrochanteric femur fracture   Operative Finding Successful completion of the planned procedure.     Post-Op Diagnosis: Same Surgeons:Primary: Mordecai Rasmussen, MD Assistants: None Location: [facility] OR ROOM 4 Anesthesia: Sedation plus regional anesthesia Antibiotics: Ancef 2 g Tourniquet time: N/A Estimated Blood Loss: 831 cc Complications: None Specimens: None Implants: Implant Name Type Inv. Item Serial No. Manufacturer Lot No. LRB No. Used Action  CORTICAL SCREW Screw   [employer] STERILE ON TRAY FROM SPD Left 1 Implanted  NAIL , LEFT Nail   [employer] STERILE ON TRAY FROM SPD Left 1 Implanted  TELESCOPING LAG SCREW Screw   [employer] STERILE ON TRAY FROM SPD Left 1 Implanted    Indications for Surgery:   Amy Sawyer is a 87 y.o. female who had a mechanical fall and sustained a Left intertrochanteric femur fracture.  I recommended operative fixation to restore stability and allow the patient to ambulate immediately postop.  Benefits and risks of operative and nonoperative management were discussed prior to surgery with patient/guardian(s) and informed consent form was completed.  Specific risks including infection, need for additional surgery, persistent pain, bleeding, malunion, nonunion and more severe complications associated with anesthesia.  The patient's husband elected to proceed and surgical consent was obtained.    Procedure:   The patient was identified properly. Informed consent was obtained and the surgical site was marked. The patient was taken to the OR where regional anesthesia was induced.   The patient was placed supine on a fracture table and appropriate reduction was obtained and visualized on fluoroscopy prior to the beginning of the procedure.  Timeout  was performed before the beginning of the case.  Ancef 2 g dosing was confirmed prior to making incision.  The patient received TXA during the case.   We made an incision proximal to the greater trochanter and dissected down through the fascia.  We then carefully placed a guidepin, localizing under fluoroscopy.  Once satisfied with the starting point, the entry reamer was used to gain entry into the intramedullary canal.  A ball tip guidewire was then introduced and passed to an appropriate level at the physeal scar of the distal femur and measurement was obtained proximally using fluoroscopy.  We selected the appropriate length of nail, as noted above.  Entry reamer was used needed but the nail was unreamed.  At this point we placed our nail localizing under fluoroscopy, and confirmed that it was at the appropriate level.  Next we used the outrigger device to pass a wire into the femoral neck, and then the cephalomedullary lag screw.  The screw was locked proximally to avoid over collapse.  We then turned our attention to the distal interlocking screw.  Once again, we used the outrigger device to place a single interlocking screw in the midshaft area.  The outrigger device was removed and final fluoroscopic images were obtained.  The wounds were thoroughly irrigated closed in a multilayer fashion with 0 vicryl, 2-0 monocryl and staples.  Sterile dressings were placed.  The patient was awoken from general anesthesia and taken to the PACU in stable condition without complication.     Post-operative plan:  Weightbearing: The patient will be WBAT on the operative extremity.   DVT prophylaxis per primary team, no orthopedic contraindications.  Recommend 81 mg Aspirin BID,  unless patient cannot tolerate or was previously on anticoagulation.  Prefer to start Ppx POD#1 Pain control with PRN pain medication preferring oral medicines.   Dressing can be reinforced as needed, will change on POD#2/3 if needed.   Patient does not need to remain hospitalized for dressing change Follow up plan: approximately 2 weeks postop for incision check and XR.  If the patient will be returning to a nursing facility, staples can be removed around this time and a follow up appointment can be scheduled for 6 weeks after surgery. XR at next visit:  please obtain AP pelvis, and 2 views of the Left hip

## 2021-01-20 NOTE — Transfer of Care (Signed)
Immediate Anesthesia Transfer of Care Note  Patient: Amy Sawyer  Procedure(s) Performed: OPERATIVE FIXATION OF LEFT HIP FRACTURE (Left Hip)  Patient Location: PACU  Anesthesia Type:Spinal  Level of Consciousness: awake  Airway & Oxygen Therapy: Patient Spontanous Breathing  Post-op Assessment: Report given to RN  Post vital signs: Reviewed and stable  Last Vitals:  Vitals Value Taken Time  BP 116/59 01/20/21 1530  Temp    Pulse 88 01/20/21 1535  Resp 19 01/20/21 1535  SpO2 94 % 01/20/21 1535  Vitals shown include unvalidated device data.  Last Pain:  Vitals:   01/20/21 1156  TempSrc: Oral  PainSc:          Complications: No complications documented.

## 2021-01-21 ENCOUNTER — Inpatient Hospital Stay (HOSPITAL_COMMUNITY): Payer: Medicare Other

## 2021-01-21 LAB — BASIC METABOLIC PANEL
Anion gap: 6 (ref 5–15)
BUN: 11 mg/dL (ref 8–23)
CO2: 27 mmol/L (ref 22–32)
Calcium: 8.2 mg/dL — ABNORMAL LOW (ref 8.9–10.3)
Chloride: 99 mmol/L (ref 98–111)
Creatinine, Ser: 0.62 mg/dL (ref 0.44–1.00)
GFR, Estimated: >60 mL/min (ref >60)
Glucose, Bld: 122 mg/dL — ABNORMAL HIGH (ref 70–99)
Potassium: 3.6 mmol/L (ref 3.5–5.1)
Sodium: 132 mmol/L — ABNORMAL LOW (ref 135–145)

## 2021-01-21 LAB — CBC
HCT: 28.1 % — ABNORMAL LOW (ref 36.0–46.0)
Hemoglobin: 9.1 g/dL — ABNORMAL LOW (ref 12.0–15.0)
MCH: 30.6 pg (ref 26.0–34.0)
MCHC: 32.4 g/dL (ref 30.0–36.0)
MCV: 94.6 fL (ref 80.0–100.0)
Platelets: 141 10*3/uL — ABNORMAL LOW (ref 150–400)
RBC: 2.97 MIL/uL — ABNORMAL LOW (ref 3.87–5.11)
RDW: 13.2 % (ref 11.5–15.5)
WBC: 6.8 10*3/uL (ref 4.0–10.5)
nRBC: 0 % (ref 0.0–0.2)

## 2021-01-21 LAB — MAGNESIUM: Magnesium: 1.8 mg/dL (ref 1.7–2.4)

## 2021-01-21 MED ORDER — AMLODIPINE BESYLATE 5 MG PO TABS: 2.5000 mg | ORAL_TABLET | Freq: Every day | ORAL | 0 refills | Status: DC

## 2021-01-21 MED ORDER — OXYCODONE HCL 5 MG PO TABS: 5.0000 mg | ORAL_TABLET | ORAL | 0 refills | Status: DC | PRN

## 2021-01-21 MED ORDER — SERTRALINE HCL 50 MG PO TABS: 50.0000 mg | ORAL_TABLET | Freq: Every day | ORAL | 0 refills | Status: DC

## 2021-01-21 MED ORDER — CLONAZEPAM 0.5 MG PO TABS: 0.2500 mg | ORAL_TABLET | Freq: Two times a day (BID) | ORAL | 0 refills | Status: DC | PRN

## 2021-01-21 MED ORDER — ENSURE ENLIVE PO LIQD: 237.0000 mL | Freq: Three times a day (TID) | ORAL | 12 refills | Status: AC

## 2021-01-21 MED ORDER — ENOXAPARIN SODIUM 30 MG/0.3ML IJ SOSY
30.0000 mg | PREFILLED_SYRINGE | INTRAMUSCULAR | Status: DC
  Administered 2021-01-21 – 2021-01-22 (×2): 30 mg via SUBCUTANEOUS
  Filled 2021-01-21 (×2): qty 0.3

## 2021-01-21 MED ORDER — SIMVASTATIN 20 MG PO TABS: 20.0000 mg | ORAL_TABLET | Freq: Every day | ORAL | 2 refills | Status: DC

## 2021-01-21 NOTE — Plan of Care (Signed)

## 2021-01-21 NOTE — Discharge Summary (Signed)
Physician Discharge Summary  MORAYO LEVEN ZOX:[id number] DOB: [birth year] DOA: 01/18/2021  PCP: Asencion Noble, MD  Admit date: 01/18/2021  Discharge date: 01/21/2021  Admitted From:Home  Disposition:  SNF  Recommendations for Outpatient Follow-up:  1. Follow up with PCP in 1-2 weeks 2. Follow up with Orthopedics in 6 weeks for outpatient follow up 3. Continue ASA 325mg  daily as prescribed 4. Remove staples in 2 weeks 5. Continue on pain medications as prescribed  Home Health:N/A  Equipment/Devices:None  Discharge Condition:Stable  CODE STATUS: DNR  Diet recommendation: Heart Healthy  Brief/Interim Summary:  JanetCockmanis a87 y.o.female,who is extremely hard of hearing, with history of stroke, hyperlipidemia, hypertension presents the ED with a chief complaint of fall. History is limited by patient has hearing impairment.She has sustained a left intertrochanteric hip fractures/p fall. Orthopedics has performed an On 5/18.  She has tolerated this procedure well with no significant issues noted afterwards.  She has been seen by PT with recommendations for SNF upon discharge.  No other acute events noted throughout the course of this admission.  Discharge Diagnoses:  Active Problems:   Hip fracture [facility])  Principal discharge diagnosis: Left intertrochanteric hip fracture secondary to mechanical fall status post IM nail 5/18.  Discharge Instructions  Discharge Instructions    Diet - low sodium heart healthy   Complete by: As directed    If the dressing is still on your incision site when you go home, remove it on the third day after your surgery date. Remove dressing if it begins to fall off, or if it is dirty or damaged before the third day.   Complete by: As directed    Increase activity slowly   Complete by: As directed      Allergies as of 01/21/2021      Reactions   Demerol [meperidine] Anaphylaxis   Hydrocodone-acetaminophen Nausea And Vomiting       Medication List    STOP taking these medications   oxyCODONE-acetaminophen 5-325 MG tablet Commonly known as: PERCOCET/ROXICET     TAKE these medications   acetaminophen 500 MG tablet Commonly known as: TYLENOL Take 500 mg by mouth every 6 (six) hours as needed for mild pain or headache.   amLODipine 5 MG tablet Commonly known as: NORVASC Take 0.5 tablets (2.5 mg total) by mouth daily. Start taking on: Jan 22, 2021 What changed:   medication strength  how much to take  Another medication with the same name was removed. Continue taking this medication, and follow the directions you see here.   aspirin 325 MG EC tablet Take 1 tablet (325 mg total) by mouth daily.   clonazePAM 0.5 MG tablet Commonly known as: KLONOPIN Take 0.5 tablets (0.25 mg total) by mouth 2 (two) times daily as needed for anxiety.   feeding supplement Liqd Take 237 mLs by mouth 3 (three) times daily with meals.   oxyCODONE 5 MG immediate release tablet Commonly known as: Oxy IR/ROXICODONE Take 1 tablet (5 mg total) by mouth every 4 (four) hours as needed for moderate pain.   polyethylene glycol 17 g packet Commonly known as: MiraLax Take 17 g by mouth daily.   ramipril 10 MG capsule Commonly known as: ALTACE Take 10 mg by mouth daily.   sertraline 50 MG tablet Commonly known as: ZOLOFT Take 1 tablet (50 mg total) by mouth daily.   simvastatin 20 MG tablet Commonly known as: ZOCOR Take 1 tablet (20 mg total) by mouth daily.   trolamine salicylate 10 % cream  Commonly known as: ASPERCREME Apply 1 application topically 2 (two) times daily as needed for muscle pain.            Discharge Care Instructions  (From admission, onward)         Start     Ordered   01/21/21 0000  If the dressing is still on your incision site when you go home, remove it on the third day after your surgery date. Remove dressing if it begins to fall off, or if it is dirty or damaged before the third day.         01/21/21 1139          Follow-up Information    Asencion Noble, MD. Schedule an appointment as soon as possible for a visit in 1 week(s).   Specialty: Internal Medicine Contact information: [address] [place] [address] [phone]        Mordecai Rasmussen, MD. Schedule an appointment as soon as possible for a visit in 6 week(s).   Specialties: Orthopedic Surgery, Sports Medicine Contact information: [place]. [address] [place] [address] [phone]              Allergies  Allergen Reactions  . Demerol [Meperidine] Anaphylaxis  . Hydrocodone-Acetaminophen Nausea And Vomiting    Consultations:  Orthopedics   Procedures/Studies: DG Chest Port 1 View  Result Date: 01/19/2021 CLINICAL DATA:  Preop examination for left hip fracture EXAM: PORTABLE CHEST 1 VIEW COMPARISON:  July 17, 2020 FINDINGS: Patient is rotated. Stable cardiac enlargement. Tortuous atherosclerotic aorta. Pulmonary hyperinflation with chronic bronchitic changes. Nodular 2.3 cm paramedian left upper lobe opacity which may represent artifact due to patient rotation. Levoconvex curvature of the thoracolumbar spine. Diffuse demineralization of bone. IMPRESSION: Nodular 2.3 cm left upper lobe opacity which may represent artifact due to patient rotation, consider further evaluation with chest CT. Electronically Signed   By: Dahlia Bailiff MD   On: 01/19/2021 01:50   DG Knee Complete 4 Views Left  Result Date: 01/18/2021 CLINICAL DATA:  Status post fall. EXAM: LEFT KNEE - COMPLETE 4+ VIEW COMPARISON:  May 22, 2008 FINDINGS: No evidence of an acute fracture or dislocation. Moderate severity medial and lateral tibiofemoral compartment space narrowing is seen. Mild vascular calcification is noted. A very small joint effusion is seen. IMPRESSION: 1. Degenerative changes without an acute osseous abnormality. 2. Very small joint effusion. Electronically Signed   By: Virgina Norfolk M.D.   On:  01/18/2021 22:41   DG HIP OPERATIVE UNILAT W OR W/O PELVIS LEFT  Result Date: 01/20/2021 CLINICAL DATA:  Left hip fracture EXAM: OPERATIVE left HIP (WITH PELVIS IF PERFORMED) 5 VIEWS TECHNIQUE: Fluoroscopic spot image(s) were submitted for interpretation post-operatively. COMPARISON:  01/18/2021 FINDINGS: C-arm images were obtained for ORIF of left intertrochanteric fracture. Compression screw and intramedullary rod in the left femur in satisfactory position. Locking and screw in the mid shaft of the femur. IMPRESSION: ORIF left intertrochanteric fracture Electronically Signed   By: Franchot Gallo M.D.   On: 01/20/2021 16:24   DG HIP UNILAT WITH PELVIS 2-3 VIEWS LEFT  Result Date: 01/20/2021 CLINICAL DATA:  ORIF left hip fracture EXAM: DG HIP (WITH OR WITHOUT PELVIS) 2-3V LEFT COMPARISON:  01/19/2021 FINDINGS: Intertrochanteric fracture. Interval ORIF with screw and locking intramedullary rod. Improved angulation since the prior study. Avulsion of the lesser trochanter IMPRESSION: ORIF intertrochanteric fracture left femur with improved angulation. Electronically Signed   By: Franchot Gallo M.D.   On: 01/20/2021 16:23  DG Hip Unilat With Pelvis 2-3 Views Left  Result Date: 01/19/2021 CLINICAL DATA:  Fall, left hip pain EXAM: DG HIP (WITH OR WITHOUT PELVIS) 2-3V LEFT COMPARISON:  None. FINDINGS: Single view radiograph pelvis and two view radiograph left hip demonstrates an acute, comminuted, intratrochanteric left hip fracture with 1/2 shaft with lateral displacement and moderate varus angulation of the distal fracture fragment as well as avulsion of the lesser trochanter. The left femoral head is still seated within the left acetabulum. Left hip joint space appears preserved. The pelvis and visualized right hip appear intact. IMPRESSION: Acute, comminuted, displaced, angulated left intratrochanteric femoral neck fracture with avulsion of the lesser trochanter. Electronically Signed   By: Fidela Salisbury  MD   On: 01/19/2021 01:04     Discharge Exam: Vitals:   01/21/21 0104 01/21/21 0410  BP:  118/75  Pulse:  100  Resp:  18  Temp:  98 F (36.7 C)  SpO2: 96% 99%   Vitals:   01/20/21 2200 01/21/21 0053 01/21/21 0104 01/21/21 0410  BP: (!) 135/52 (!) 139/57  118/75  Pulse: 94 88  100  Resp:  16  18  Temp: 98.4 F (36.9 C) 98.4 F (36.9 C)  98 F (36.7 C)  TempSrc: Oral     SpO2: 91% 91% 96% 99%  Weight:      Height:        General: Pt is alert, awake, not in acute distress, hard of hearing Cardiovascular: RRR, S1/S2 +, no rubs, no gallops Respiratory: CTA bilaterally, no wheezing, no rhonchi Abdominal: Soft, NT, ND, bowel sounds + Extremities: no edema, no cyanosis; L hip incision c/d/i    The results of significant diagnostics from this hospitalization (including imaging, microbiology, ancillary and laboratory) are listed below for reference.     Microbiology: Recent Results (from the past 240 hour(s))  Resp Panel by RT-PCR (Flu A&B, Covid) Nasopharyngeal Swab     Status: None   Collection Time: 01/18/21 11:53 PM   Specimen: Nasopharyngeal Swab; Nasopharyngeal(NP) swabs in vial transport medium  Result Value Ref Range Status   SARS Coronavirus 2 by RT PCR NEGATIVE NEGATIVE Final    Comment: (NOTE) SARS-CoV-2 target nucleic acids are NOT DETECTED.  The SARS-CoV-2 RNA is generally detectable in upper respiratory specimens during the acute phase of infection. The lowest concentration of SARS-CoV-2 viral copies this assay can detect is 138 copies/mL. A negative result does not preclude SARS-Cov-2 infection and should not be used as the sole basis for treatment or other patient management decisions. A negative result may occur with  improper specimen collection/handling, submission of specimen other than nasopharyngeal swab, presence of viral mutation(s) within the areas targeted by this assay, and inadequate number of viral copies(<138 copies/mL). A negative result  must be combined with clinical observations, patient history, and epidemiological information. The expected result is Negative.  Fact Sheet for Patients:  EntrepreneurPulse.com.au  Fact Sheet for Healthcare Providers:  IncredibleEmployment.be  This test is no t yet approved or cleared by the [place] FDA and  has been authorized for detection and/or diagnosis of SARS-CoV-2 by FDA under an Emergency Use Authorization (EUA). This EUA will remain  in effect (meaning this test can be used) for the duration of the COVID-19 declaration under Section 564(b)(1) of the Act, 21 U.S.C.section 360bbb-3(b)(1), unless the authorization is terminated  or revoked sooner.       Influenza A by PCR NEGATIVE NEGATIVE Final   Influenza B by PCR NEGATIVE NEGATIVE Final  Comment: (NOTE) The Xpert Xpress SARS-CoV-2/FLU/RSV plus assay is intended as an aid in the diagnosis of influenza from Nasopharyngeal swab specimens and should not be used as a sole basis for treatment. Nasal washings and aspirates are unacceptable for Xpert Xpress SARS-CoV-2/FLU/RSV testing.  Fact Sheet for Patients: EntrepreneurPulse.com.au  Fact Sheet for Healthcare Providers: IncredibleEmployment.be  This test is not yet approved or cleared by the [place] FDA and has been authorized for detection and/or diagnosis of SARS-CoV-2 by FDA under an Emergency Use Authorization (EUA). This EUA will remain in effect (meaning this test can be used) for the duration of the COVID-19 declaration under Section 564(b)(1) of the Act, 21 U.S.C. section 360bbb-3(b)(1), unless the authorization is terminated or revoked.  Performed at [facility], [address] [place], [place] [address]   Surgical pcr screen     Status: None   Collection Time: 01/19/21 12:02 PM   Specimen: Nasal Mucosa; Nasal Swab  Result Value Ref Range Status   MRSA, PCR NEGATIVE  NEGATIVE Final   Staphylococcus aureus NEGATIVE NEGATIVE Final    Comment: (NOTE) The Xpert SA Assay (FDA approved for NASAL specimens in patients 97 years of age and older), is one component of a comprehensive surveillance program. It is not intended to diagnose infection nor to guide or monitor treatment. Performed at [facility], [address], [place], [place] [address]      Labs: BNP (last 3 results) No results for input(s): BNP in the last 8760 hours. Basic Metabolic Panel: Recent Labs  Lab 01/18/21 0007 01/19/21 0641 01/20/21 0635 01/21/21 0710  NA 136 132* 131* 132*  K 4.0 3.9 3.8 3.6  CL 99 99 99 99  CO2 26 26 25 27   GLUCOSE 129* 119* 123* 122*  BUN 14 14 16 11   CREATININE 0.83 0.71 0.63 0.62  CALCIUM 8.9 8.5* 8.5* 8.2*  MG  --   --  1.9 1.8   Liver Function Tests: Recent Labs  Lab 01/19/21 0641  AST 20  ALT 12  ALKPHOS 41  BILITOT 0.7  PROT 5.8*  ALBUMIN 3.5   No results for input(s): LIPASE, AMYLASE in the last 168 hours. No results for input(s): AMMONIA in the last 168 hours. CBC: Recent Labs  Lab 01/18/21 0007 01/19/21 0641 01/20/21 0635 01/21/21 0710  WBC 9.7 6.9 7.2 6.8  NEUTROABS 8.4*  --   --   --   HGB 12.8 10.9* 10.2* 9.1*  HCT 38.4 33.5* 31.2* 28.1*  MCV 94.3 93.8 95.4 94.6  PLT 191 171 161 141*   Cardiac Enzymes: No results for input(s): CKTOTAL, CKMB, CKMBINDEX, TROPONINI in the last 168 hours. BNP: Invalid input(s): POCBNP CBG: No results for input(s): GLUCAP in the last 168 hours. D-Dimer No results for input(s): DDIMER in the last 72 hours. Hgb A1c No results for input(s): HGBA1C in the last 72 hours. Lipid Profile No results for input(s): CHOL, HDL, LDLCALC, TRIG, CHOLHDL, LDLDIRECT in the last 72 hours. Thyroid function studies No results for input(s): TSH, T4TOTAL, T3FREE, THYROIDAB in the last 72 hours.  Invalid input(s): FREET3 Anemia work up No results for input(s): VITAMINB12, FOLATE, FERRITIN, TIBC, IRON,  RETICCTPCT in the last 72 hours. Urinalysis    Component Value Date/Time   COLORURINE YELLOW 01/19/2021 1529   APPEARANCEUR HAZY (A) 01/19/2021 1529   LABSPEC 1.019 01/19/2021 1529   PHURINE 7.0 01/19/2021 1529   GLUCOSEU NEGATIVE 01/19/2021 1529   HGBUR SMALL (A) 01/19/2021 1529   BILIRUBINUR NEGATIVE 01/19/2021 1529  BILIRUBINUR negative 08/03/2020 1437   KETONESUR 5 (A) 01/19/2021 1529   PROTEINUR 30 (A) 01/19/2021 1529   UROBILINOGEN 0.2 08/03/2020 1437   NITRITE NEGATIVE 01/19/2021 1529   LEUKOCYTESUR NEGATIVE 01/19/2021 1529   Sepsis Labs Invalid input(s): PROCALCITONIN,  WBC,  LACTICIDVEN Microbiology Recent Results (from the past 240 hour(s))  Resp Panel by RT-PCR (Flu A&B, Covid) Nasopharyngeal Swab     Status: None   Collection Time: 01/18/21 11:53 PM   Specimen: Nasopharyngeal Swab; Nasopharyngeal(NP) swabs in vial transport medium  Result Value Ref Range Status   SARS Coronavirus 2 by RT PCR NEGATIVE NEGATIVE Final    Comment: (NOTE) SARS-CoV-2 target nucleic acids are NOT DETECTED.  The SARS-CoV-2 RNA is generally detectable in upper respiratory specimens during the acute phase of infection. The lowest concentration of SARS-CoV-2 viral copies this assay can detect is 138 copies/mL. A negative result does not preclude SARS-Cov-2 infection and should not be used as the sole basis for treatment or other patient management decisions. A negative result may occur with  improper specimen collection/handling, submission of specimen other than nasopharyngeal swab, presence of viral mutation(s) within the areas targeted by this assay, and inadequate number of viral copies(<138 copies/mL). A negative result must be combined with clinical observations, patient history, and epidemiological information. The expected result is Negative.  Fact Sheet for Patients:  EntrepreneurPulse.com.au  Fact Sheet for Healthcare Providers:   IncredibleEmployment.be  This test is no t yet approved or cleared by the [place] FDA and  has been authorized for detection and/or diagnosis of SARS-CoV-2 by FDA under an Emergency Use Authorization (EUA). This EUA will remain  in effect (meaning this test can be used) for the duration of the COVID-19 declaration under Section 564(b)(1) of the Act, 21 U.S.C.section 360bbb-3(b)(1), unless the authorization is terminated  or revoked sooner.       Influenza A by PCR NEGATIVE NEGATIVE Final   Influenza B by PCR NEGATIVE NEGATIVE Final    Comment: (NOTE) The Xpert Xpress SARS-CoV-2/FLU/RSV plus assay is intended as an aid in the diagnosis of influenza from Nasopharyngeal swab specimens and should not be used as a sole basis for treatment. Nasal washings and aspirates are unacceptable for Xpert Xpress SARS-CoV-2/FLU/RSV testing.  Fact Sheet for Patients: EntrepreneurPulse.com.au  Fact Sheet for Healthcare Providers: IncredibleEmployment.be  This test is not yet approved or cleared by the [place] FDA and has been authorized for detection and/or diagnosis of SARS-CoV-2 by FDA under an Emergency Use Authorization (EUA). This EUA will remain in effect (meaning this test can be used) for the duration of the COVID-19 declaration under Section 564(b)(1) of the Act, 21 U.S.C. section 360bbb-3(b)(1), unless the authorization is terminated or revoked.  Performed at [facility], [address] [place], [place] [address]   Surgical pcr screen     Status: None   Collection Time: 01/19/21 12:02 PM   Specimen: Nasal Mucosa; Nasal Swab  Result Value Ref Range Status   MRSA, PCR NEGATIVE NEGATIVE Final   Staphylococcus aureus NEGATIVE NEGATIVE Final    Comment: (NOTE) The Xpert SA Assay (FDA approved for NASAL specimens in patients 23 years of age and older), is one component of a comprehensive surveillance program. It is not  intended to diagnose infection nor to guide or monitor treatment. Performed at [facility], [address], [place], [place] [address]      Time coordinating discharge: 35 minutes  SIGNED:   Rodena Goldmann, DO [facility] 01/21/2021, 12:51 PM  If 7PM-7AM, please contact night-coverage [email or web address]

## 2021-01-21 NOTE — Anesthesia Postprocedure Evaluation (Signed)
Anesthesia Post Note  Patient: Amy Sawyer  Procedure(s) Performed: OPERATIVE FIXATION OF LEFT HIP FRACTURE (Left Hip)  Patient location during evaluation: Phase II Anesthesia Type: Spinal Level of consciousness: awake Pain management: pain level controlled Vital Signs Assessment: post-procedure vital signs reviewed and stable Respiratory status: spontaneous breathing and respiratory function stable Cardiovascular status: blood pressure returned to baseline and stable Postop Assessment: no headache and no apparent nausea or vomiting Anesthetic complications: no Comments: Late entry   No complications documented.   Last Vitals:  Vitals:   01/21/21 0104 01/21/21 0410  BP:  118/75  Pulse:  100  Resp:  18  Temp:  36.7 C  SpO2: 96% 99%    Last Pain:  Vitals:   01/21/21 1001  TempSrc:   PainSc: [place]

## 2021-01-21 NOTE — Progress Notes (Signed)
SLP Cancellation Note  Patient Details Name: Amy Sawyer MRN: [id number] DOB: 10-21-1930   Cancelled treatment:       Reason Eval/Treat Not Completed: Patient declined, no reason specified (Pt reported she was in too much pain to complete MBSS when radiology arrived to transport her for MBSS. Will change liquids to NTL and complete MBSS tomorrow)  Thank you,  Genene Churn, [place]  [place] 01/21/2021, 12:15 PM

## 2021-01-21 NOTE — Evaluation (Signed)
Physical Therapy Evaluation Patient Details Name: Amy Sawyer MRN: [id number] DOB: March 26, 1931 Today's Date: 01/21/2021   History of Present Illness  Amy Sawyer  is a 87 y.o. female, who is extremely hard of hearing, s/p INTRAMEDULLARY (IM) NAIL INTERTROCHANTERIC; CMN for L IT fracture on 01/20/21  with history of stroke, hyperlipidemia, hypertension presents the ED with a chief complaint of fall.  History is limited by patient has hearing impairment and also the fact that she was just given pain medication and is calm drowsy.  Daughter at bedside does her best to explain what happened.  Apparently, patient was in the kitchen putting away groceries when she went to turn around lost balance and fell backwards.  She fell against the fridge.  I do think she hit her head, but she did not lose consciousness.  She has not had any change in altered mental status prior to the pain medication.  Patient lives with husband, who was not able to get her up.  They called daughter who lives across the street and was able to come over and help.  At that time patient was complaining mostly of left knee pain.  Of note patient ambulates with a walker, but he continues to smoke get the walker into it.  She often has spells where she loses her balance.  Otherwise she has been in her normal state of.    Clinical Impression  Patient demonstrates slow labored movement for sitting up at bedside with c/o severe pain and guarding of LLE due to increased pain, poor standing balance and requires verbal/tactile cueing to keep hands on RW when standing due to tendency to reach for nearby objects for support due to LLE pain when weightbearing. Patient tolerated sitting up in chair after therapy - nursing staff aware.  Patient will benefit from continued physical therapy in hospital and recommended venue below to increase strength, balance, endurance for safe ADLs and gait.      Follow Up Recommendations SNF    Equipment  Recommendations  None recommended by PT    Recommendations for Other Services       Precautions / Restrictions Precautions Precautions: Fall Restrictions Weight Bearing Restrictions: Yes LLE Weight Bearing: Weight bearing as tolerated      Mobility  Bed Mobility Overal bed mobility: Needs Assistance Bed Mobility: Supine to Sit     Supine to sit: Mod assist     General bed mobility comments: increased time, labored movement, c/o severe pain LLE with movement    Transfers Overall transfer level: Needs assistance Equipment used: Rolling walker (2 wheeled) Transfers: Sit to/from [facility] Sit to Stand: Mod assist Stand pivot transfers: Mod assist       General transfer comment: slow labored movement with limited weighbearing on LLE  Ambulation/Gait Ambulation/Gait assistance: Mod assist;Max assist Gait Distance (Feet): 4 Feet Assistive device: Rolling walker (2 wheeled) Gait Pattern/deviations: Decreased step length - right;Decreased step length - left;Decreased stance time - left;Decreased stride length;Antalgic Gait velocity: slow   General Gait Details: limited to a few steps with poor tolerance for weightbearing on LLE due to increased pain and requires verbal/tactile cueing to keep hands on RW due tendeny to reach for nearby objects for support  Stairs            Wheelchair Mobility    Modified Rankin (Stroke Patients Only)       Balance Overall balance assessment: Needs assistance Sitting-balance support: Feet supported;No upper extremity supported Sitting balance-Leahy Scale: [place] Sitting  balance - Comments: seated at EOB   Standing balance support: During functional activity;Bilateral upper extremity supported Standing balance-Leahy Scale: Poor Standing balance comment: using RW                             Pertinent Vitals/Pain Pain Assessment: Faces Faces Pain Scale: Hurts whole lot Pain Location: LLE with  movement Pain Descriptors / Indicators: Grimacing;Moaning;Guarding;Sore Pain Intervention(s): Limited activity within patient's tolerance;Monitored during session;Repositioned;RN gave pain meds during session    [name] expects to be discharged to:: Private residence Living Arrangements: Spouse/significant other Available Help at Discharge: Family;Available 24 hours/day Type of Home: House Home Access: Ramped entrance     Home Layout: Two level Home Equipment: Walker - 2 wheels;Bedside commode;Cane - single point;Wheelchair - manual Additional Comments: information taken from previous admission due to patient very HOH    Prior Function Level of Independence: Independent with assistive device(s)         Comments: household and short distanced community ambulator using [facility] PRN     Hand Dominance   Dominant Hand: Right    Extremity/Trunk Assessment   Upper Extremity Assessment Upper Extremity Assessment: Defer to OT evaluation    Lower Extremity Assessment Lower Extremity Assessment: Generalized weakness;LLE deficits/detail LLE Deficits / Details: grossly -3/5 LLE: Unable to fully assess due to pain LLE Sensation: WNL LLE Coordination: WNL    Cervical / Trunk Assessment Cervical / Trunk Assessment: Normal  Communication   Communication: HOH  Cognition Arousal/Alertness: Awake/alert Behavior During Therapy: WFL for tasks assessed/performed Overall Cognitive Status: Within Functional Limits for tasks assessed                                        General Comments      Exercises     Assessment/Plan    PT Assessment Patient needs continued PT services  PT Problem List Decreased strength;Decreased activity tolerance;Decreased balance;Decreased mobility       PT Treatment Interventions DME instruction;Stair training;Gait training;Functional mobility training;Therapeutic activities;Therapeutic exercise;Patient/family  education;Balance training    PT Goals (Current goals can be found in the Care Plan section)  Acute Rehab PT Goals Patient Stated Goal: return home after rehab PT Goal Formulation: With patient Time For Goal Achievement: 02/04/21 Potential to Achieve Goals: Good    Frequency Min 4X/week   Barriers to discharge        Co-evaluation               AM-PAC PT "6 Clicks" Mobility  Outcome Measure Help needed turning from your back to your side while in a flat bed without using bedrails?: A Lot Help needed moving from lying on your back to sitting on the side of a flat bed without using bedrails?: A Lot Help needed moving to and from a bed to a chair (including a wheelchair)?: A Lot Help needed standing up from a chair using your arms (e.g., wheelchair or bedside chair)?: A Lot Help needed to walk in hospital room?: A Lot Help needed climbing 3-5 steps with a railing? : Total 6 Click Score: 11    End of Session   Activity Tolerance: Patient tolerated treatment well;Patient limited by fatigue;Patient limited by pain Patient left: in chair;with call bell/phone within reach Nurse Communication: Mobility status PT Visit Diagnosis: Unsteadiness on feet (R26.81);Other abnormalities of gait and mobility (R26.89);Muscle weakness (  generalized) (M62.81)    Time: 3074-6002 PT Time Calculation (min) (ACUTE ONLY): 28 min   Charges:   PT Evaluation $PT Eval Moderate Complexity: 1 Mod PT Treatments $Therapeutic Activity: 23-37 mins        3:28 PM, 01/21/21 Lonell Grandchild, MPT Physical Therapist with [facility] [phone] office [phone] mobile phone

## 2021-01-21 NOTE — Plan of Care (Signed)
  Problem: Acute Rehab PT Goals(only PT should resolve) Goal: Pt Will Go Supine/Side To Sit Outcome: Progressing Flowsheets (Taken 01/21/2021 1529) Pt will go Supine/Side to Sit:  with minimal assist  with moderate assist Goal: Patient Will Transfer Sit To/From Stand Outcome: Progressing Flowsheets (Taken 01/21/2021 1529) Patient will transfer sit to/from stand:  with minimal assist  with moderate assist Goal: Pt Will Transfer Bed To Chair/Chair To Bed Outcome: Progressing Flowsheets (Taken 01/21/2021 1529) Pt will Transfer Bed to Chair/Chair to Bed:  with min assist  with mod assist Goal: Pt Will Ambulate Outcome: Progressing Flowsheets (Taken 01/21/2021 1529) Pt will Ambulate:  25 feet  with minimal assist  with moderate assist  with rolling walker   3:30 PM, 01/21/21 Lonell Grandchild, MPT Physical Therapist with [facility] [phone] office [phone] mobile phone

## 2021-01-21 NOTE — Progress Notes (Signed)
   ORTHOPAEDIC PROGRESS NOTE  S/p Procedure(s): INTRAMEDULLARY (IM) NAIL INTERTROCHANTERIC; CMN for L IT fracture  DOS: 01/20/21  SUBJECTIVE: No issues over night.  Complaining of pain in her legs, left worse than right.  She is seated at bedside, daughter visiting today.   OBJECTIVE: PE:  Resting comfortably, no acute distress; thin elderly female  In bedside chair  Lateral hip dressings are clean, dry and intact Toes are warm and well perfused Active motion of great toe and ankle witnessed Responds to light touch   Vitals:   01/21/21 0104 01/21/21 0410  BP:  118/75  Pulse:  100  Resp:  18  Temp:  98 F (36.7 C)  SpO2: 96% 99%   XR of the left hip demonstrates improved alignment of comminuted IT fracture with stable position of the intramedullary rod.  No acute injuries.   ASSESSMENT: Amy Sawyer is a 87 y.o. female doing well postop  PLAN: Weightbearing: WBAT LLE Insicional and dressing care: Reinforce dressings as needed Orthopedic device(s): None VTE prophylaxis: to resume POD#1  Pain control: PO medications PRN, limit sedating medications Follow - up plan: 2 weeks postop; staples can be removed in SNF.  Follow up in clinic in 6 weeks if staples removed elsewhere.    Contact information:     Brody Kump A. Amedeo Kinsman, MD [facility] [address] [place],  [place]  [address] Phone: [phone] Fax: [phone]

## 2021-01-21 NOTE — Progress Notes (Signed)
  Speech Language Pathology Treatment: Dysphagia  Patient Details Name: Amy Sawyer MRN: [id number] DOB: [birth year] Today's Date: 01/21/2021 Time: 6553-7482 SLP Time Calculation (min) (ACUTE ONLY): 21 min  Assessment / Plan / Recommendation Clinical Impression  Pt seen for ongoing dysphagia intervention following hip surgery yesterday (BSE completed on Monday with recommendation for D2/NTL). Pt alert, cooperative, and expressed excitement for water and coffee. Pt with delayed cough x1 and some throat clearing after thin liquids (appears slightly improved since Monday). Given significant weight loss, poor po intake at home, variable signs of aspiration with thins, and now hip fracture with repair, will completed MBSS later today with full results to follow.    HPI HPI: Amy Sawyer  is a 87 y.o. female, who is extremely hard of hearing, with history of stroke, hyperlipidemia, hypertension presents the ED with a chief complaint of fall.  History is limited by patient has hearing impairment.  She has sustained a left intertrochanteric hip fracture s/p fall. Orthopedics planning to do IM Nail on 5/18.BSE requested due to weight loss and coughing with medication with water earlier today.      SLP Plan  MBS       Recommendations  Diet recommendations: Dysphagia 2 (fine chop);Nectar-thick liquid Liquids provided via: Cup Medication Administration: Whole meds with puree Supervision: Staff to assist with self feeding;Full supervision/cueing for compensatory strategies Compensations: Slow rate;Small sips/bites Postural Changes and/or Swallow Maneuvers: Seated upright 90 degrees;Upright 30-60 min after meal                Oral Care Recommendations: Oral care BID;Staff/trained caregiver to provide oral care Follow up Recommendations: Skilled Nursing facility SLP Visit Diagnosis: Dysphagia, unspecified (R13.10) Plan: MBS       Thank you,  Genene Churn,  [place]                 [place] 01/21/2021, 9:30 AM

## 2021-01-22 ENCOUNTER — Inpatient Hospital Stay (HOSPITAL_COMMUNITY): Payer: Medicare Other

## 2021-01-22 ENCOUNTER — Inpatient Hospital Stay
Admission: RE | Admit: 2021-01-22 | Discharge: 2021-02-24 | Disposition: A | Discharge status: Home / Self Care | Payer: Medicare Other | Source: Ambulatory Visit | Attending: Internal Medicine | Admitting: Internal Medicine

## 2021-01-22 DIAGNOSIS — M6281 Muscle weakness (generalized): Secondary | ICD-10-CM | POA: Diagnosis not present

## 2021-01-22 DIAGNOSIS — U071 COVID-19: Secondary | ICD-10-CM | POA: Diagnosis not present

## 2021-01-22 DIAGNOSIS — I700 Atherosclerosis of aorta: Secondary | ICD-10-CM | POA: Diagnosis not present

## 2021-01-22 DIAGNOSIS — H9193 Unspecified hearing loss, bilateral: Secondary | ICD-10-CM | POA: Diagnosis not present

## 2021-01-22 DIAGNOSIS — D6869 Other thrombophilia: Secondary | ICD-10-CM | POA: Diagnosis not present

## 2021-01-22 DIAGNOSIS — E43 Unspecified severe protein-calorie malnutrition: Secondary | ICD-10-CM | POA: Diagnosis not present

## 2021-01-22 DIAGNOSIS — I10 Essential (primary) hypertension: Secondary | ICD-10-CM | POA: Diagnosis not present

## 2021-01-22 DIAGNOSIS — E440 Moderate protein-calorie malnutrition: Secondary | ICD-10-CM | POA: Diagnosis not present

## 2021-01-22 DIAGNOSIS — K5909 Other constipation: Secondary | ICD-10-CM | POA: Diagnosis not present

## 2021-01-22 DIAGNOSIS — F339 Major depressive disorder, recurrent, unspecified: Secondary | ICD-10-CM | POA: Diagnosis not present

## 2021-01-22 DIAGNOSIS — R2681 Unsteadiness on feet: Secondary | ICD-10-CM | POA: Diagnosis not present

## 2021-01-22 DIAGNOSIS — I639 Cerebral infarction, unspecified: Secondary | ICD-10-CM | POA: Diagnosis not present

## 2021-01-22 DIAGNOSIS — D62 Acute posthemorrhagic anemia: Secondary | ICD-10-CM | POA: Diagnosis not present

## 2021-01-22 DIAGNOSIS — S72002G Fracture of unspecified part of neck of left femur, subsequent encounter for closed fracture with delayed healing: Secondary | ICD-10-CM | POA: Diagnosis not present

## 2021-01-22 DIAGNOSIS — Z8673 Personal history of transient ischemic attack (TIA), and cerebral infarction without residual deficits: Secondary | ICD-10-CM | POA: Diagnosis not present

## 2021-01-22 DIAGNOSIS — J1282 Pneumonia due to coronavirus disease 2019: Secondary | ICD-10-CM | POA: Diagnosis not present

## 2021-01-22 DIAGNOSIS — Z66 Do not resuscitate: Secondary | ICD-10-CM | POA: Diagnosis not present

## 2021-01-22 DIAGNOSIS — Z9181 History of falling: Secondary | ICD-10-CM | POA: Diagnosis not present

## 2021-01-22 DIAGNOSIS — S72142D Displaced intertrochanteric fracture of left femur, subsequent encounter for closed fracture with routine healing: Secondary | ICD-10-CM | POA: Diagnosis not present

## 2021-01-22 DIAGNOSIS — E785 Hyperlipidemia, unspecified: Secondary | ICD-10-CM | POA: Diagnosis not present

## 2021-01-22 DIAGNOSIS — R059 Cough, unspecified: Secondary | ICD-10-CM | POA: Diagnosis not present

## 2021-01-22 DIAGNOSIS — R262 Difficulty in walking, not elsewhere classified: Secondary | ICD-10-CM | POA: Diagnosis not present

## 2021-01-22 NOTE — Care Management Important Message (Signed)
Important Message  Patient Details  Name: Amy Sawyer MRN: [id number] Date of Birth: May 09, 1931   Medicare Important Message Given:  Yes     Tommy Medal 01/22/2021, 12:17 PM

## 2021-01-22 NOTE — Progress Notes (Signed)
Discharged to [facility], report called and given to [facility] LPN. Family aware of discharge, Vital signs stable. Awaiting for transport to facility.

## 2021-01-22 NOTE — Progress Notes (Signed)
Patient seen and evaluated at bedside with no acute events documented overnight.  She has undergone her MBS this morning with recommendations to continue on dysphagia 2 diet.  She is in stable condition for discharge to rehab facility.  Please refer to discharge summary dictated 5/19 for full details.  Total care time: 15 minutes.

## 2021-01-22 NOTE — TOC Transition Note (Deleted)
Transition of Care [facility]) - CM/SW Discharge Note   Patient Details  Name: Amy Sawyer MRN: [id number] Date of Birth: 25-Jul-1931  Transition of Care [facility]) CM/SW Contact:  Natasha Bence, LCSW Phone Number: 01/22/2021, 12:17 PM   Clinical Narrative:    CSW notified of patient's readiness for discharge. Kerri with [facility] agreeable to take patient. CSW faxed discharge summary to [facility] TOC signing off.   Final next level of care: Skilled Nursing Facility Barriers to Discharge: Barriers Resolved   Patient Goals and CMS Choice Patient states their goals for this hospitalization and ongoing recovery are:: rehab then home CMS Medicare.gov Compare Post Acute Care list provided to:: Patient Choice offered to / list presented to : Patient  Discharge Placement                    Patient and family notified of of transfer: 01/22/21  Discharge Plan and Services     Post Acute Care Choice: Durable Medical Equipment                               Social Determinants of Health (SDOH) Interventions     Readmission Risk Interventions No flowsheet data found.

## 2021-01-22 NOTE — Progress Notes (Signed)
   ORTHOPAEDIC PROGRESS NOTE  S/p Procedure(s): INTRAMEDULLARY (IM) NAIL INTERTROCHANTERIC; CMN for L IT fracture  DOS: 01/20/21  SUBJECTIVE: No issues over night.  Resting comfortably this morning.  No family at bedside.   OBJECTIVE: PE:  Resting comfortably, no acute distress; thin elderly female  Lateral hip dressings are clean, dry and intact Toes are warm and well perfused Active motion of great toe and ankle witnessed Responds to light touch   Vitals:   01/21/21 2013 01/22/21 0422  BP: (!) 145/78 (!) 146/74  Pulse: (!) 101 66  Resp: 19 18  Temp: 97.6 F (36.4 C) 98.2 F (36.8 C)  SpO2: 96% 99%   XR of the left hip demonstrates improved alignment of comminuted IT fracture with stable position of the intramedullary rod.  No acute injuries.   ASSESSMENT: Amy Sawyer is a 87 y.o. female doing well postop  PLAN: Weightbearing: WBAT LLE Insicional and dressing care: Reinforce dressings as needed; do not need to be changed prior to DC Orthopedic device(s): None VTE prophylaxis: to resume POD#1; at the discretion of the primary team Pain control: PO medications PRN, limit sedating medications Follow - up plan: 2 weeks postop; staples can be removed in SNF.  Follow up in clinic in 6 weeks if staples removed elsewhere.    Contact information:     Nickalos Petersen A. Amedeo Kinsman, MD [facility] [address] [place],  [place]  [address] Phone: [phone] Fax: [phone]

## 2021-01-22 NOTE — TOC Transition Note (Signed)
Transition of Care [facility]) - CM/SW Discharge Note   Patient Details  Name: DARLETTE DUBOW MRN: [id number] Date of Birth: [birth year]  [facility] CM/SW Contact:  Natasha Bence, LCSW Phone Number: 01/22/2021, 12:20 PM   Clinical Narrative:    CSW notified of patient's readiness for discharge. Kerri with [facility] agreeable to take patient. CSW faxed discharge summary to [facility]. TOC signing off.    Final next level of care: Skilled Nursing Facility Barriers to Discharge: Barriers Resolved   Patient Goals and CMS Choice Patient states their goals for this hospitalization and ongoing recovery are:: rehab then home CMS Medicare.gov Compare Post Acute Care list provided to:: Patient Choice offered to / list presented to : Patient  Discharge Placement                Patient to be transferred to facility by: [facility] Name of family member notified: Deeann Saint Patient and family notified of of transfer: 01/22/21  Discharge Plan and Services     Post Acute Care Choice: Durable Medical Equipment                               Social Determinants of Health (SDOH) Interventions     Readmission Risk Interventions No flowsheet data found.

## 2021-01-22 NOTE — Evaluation (Signed)
  Modified Barium Swallow Progress Note  Patient Details  Name: Amy Sawyer MRN: [id number] Date of Birth: 1931/08/23  Today's Date: 01/22/2021  Modified Barium Swallow completed.  Full report located under Chart Review in the Imaging Section.  Brief recommendations include the following:  Clinical Impression  Pt presents with mild pharyngeal dysphagia characterized by consistent deep penetration of thin liquids that is always sensed either by a reflexive cough or throat clear, however penetrates are not always cleared from the airway and despite not being visualized are questionably falling below the cords after the swallow; with consecutive straw sips of thin liquids note a moderate amount falling to cords and trace aspiration. Pt demonstrated decreased laryngeal vestibule closure and decreased pharyngeal squeeze; decreased squeeze results in mild to mod valleculae and pyriform residue after the swallow across consistencies; residue is cleared by a repeat swallow. Pt consumed NTL with one episode of flash penetration that was completely cleared during the swallow. Pt was unable to swallow the barium tablet -- eventually it was expectorated. Solid textures and puree textures were consumed without incident. Recommend continue with D2/fine chop diet and NTL - recommend meds to be crushed in puree. Further recommend initiate dysphagia therapy to facilitate pharyngeal strengthening and trials of thin liquids; Pt is a good candidate for free water protocol *water only in between meals AFTER oral care*. Repeat MBS may be indicated when clinically appropriate, however, no silent aspiration was observed, therefore, clinical/bedside upgrade may be appropriate. ST will continue to follow acutely, thank you   Swallow Evaluation Recommendations       SLP Diet Recommendations: Dysphagia 2 (Fine chop) solids;Nectar thick liquid   Liquid Administration via: Cup;Straw   Medication Administration: Crushed  with puree   Supervision: Patient able to self feed   Compensations: Slow rate;Small sips/bites   Postural Changes: Seated upright at 90 degrees   Oral Care Recommendations: Oral care BID   Other Recommendations: Order thickener from pharmacy;Prohibited food (jello, ice cream, thin soups);Clarify dietary restrictions  Mauri Tolen H. Roddie Mc, [place] Speech Language Pathologist   Wende Bushy 01/22/2021,10:38 AM

## 2021-01-22 NOTE — Progress Notes (Signed)
Nsg Discharge Note  Admit Date:  01/18/2021 Discharge date: 01/22/2021   Amy Sawyer to be D/C'd Skilled nursing facility per MD order.  AVS completed.  Copy for chart, and copy for patient signed, and dated. Patient/caregiver able to verbalize understanding.  Discharge Medication: Allergies as of 01/22/2021      Reactions   Demerol [meperidine] Anaphylaxis   Hydrocodone-acetaminophen Nausea And Vomiting      Medication List    STOP taking these medications   oxyCODONE-acetaminophen 5-325 MG tablet Commonly known as: PERCOCET/ROXICET     TAKE these medications   acetaminophen 500 MG tablet Commonly known as: TYLENOL Take 500 mg by mouth every 6 (six) hours as needed for mild pain or headache.   amLODipine 5 MG tablet Commonly known as: NORVASC Take 0.5 tablets (2.5 mg total) by mouth daily. What changed:   medication strength  how much to take  Another medication with the same name was removed. Continue taking this medication, and follow the directions you see here.   aspirin 325 MG EC tablet Take 1 tablet (325 mg total) by mouth daily.   clonazePAM 0.5 MG tablet Commonly known as: KLONOPIN Take 0.5 tablets (0.25 mg total) by mouth 2 (two) times daily as needed for anxiety.   feeding supplement Liqd Take 237 mLs by mouth 3 (three) times daily with meals.   oxyCODONE 5 MG immediate release tablet Commonly known as: Oxy IR/ROXICODONE Take 1 tablet (5 mg total) by mouth every 4 (four) hours as needed for moderate pain.   polyethylene glycol 17 g packet Commonly known as: MiraLax Take 17 g by mouth daily.   ramipril 10 MG capsule Commonly known as: ALTACE Take 10 mg by mouth daily.   sertraline 50 MG tablet Commonly known as: ZOLOFT Take 1 tablet (50 mg total) by mouth daily.   simvastatin 20 MG tablet Commonly known as: ZOCOR Take 1 tablet (20 mg total) by mouth daily.   trolamine salicylate 10 % cream Commonly known as: ASPERCREME Apply 1  application topically 2 (two) times daily as needed for muscle pain.            Discharge Care Instructions  (From admission, onward)         Start     Ordered   01/21/21 0000  If the dressing is still on your incision site when you go home, remove it on the third day after your surgery date. Remove dressing if it begins to fall off, or if it is dirty or damaged before the third day.        01/21/21 1139          Discharge Assessment: Vitals:   01/22/21 0422 01/22/21 1421  BP: (!) 146/74 (!) 107/52  Pulse: 66 98  Resp: 18 18  Temp: 98.2 F (36.8 C) 98.2 F (36.8 C)  SpO2: 99% 93%   Skin clean, dry and intact without evidence of skin break down, no evidence of skin tears noted. IV catheter discontinued intact. Site without signs and symptoms of complications - no redness or edema noted at insertion site, patient denies c/o pain - only slight tenderness at site.  Dressing with slight pressure applied.  D/c Instructions-Education: Discharge instructions given to patient/family with verbalized understanding. D/c education completed with patient/family including follow up instructions, medication list, d/c activities limitations if indicated, with other d/c instructions as indicated by MD - patient able to verbalize understanding, all questions fully answered. Patient instructed to return to ED, call  911, or call MD for any changes in condition.  Patient escorted via [place], and D/C home via private auto.  Dorcas Mcmurray, LPN 05/21/3845 6:59 PM

## 2021-01-25 ENCOUNTER — Encounter: Payer: Self-pay | Admitting: Adult Health

## 2021-01-25 ENCOUNTER — Other Ambulatory Visit: Payer: Self-pay | Admitting: Adult Health

## 2021-01-25 ENCOUNTER — Non-Acute Institutional Stay (SKILLED_NURSING_FACILITY): Payer: Medicare Other | Admitting: Adult Health

## 2021-01-25 DIAGNOSIS — I10 Essential (primary) hypertension: Secondary | ICD-10-CM

## 2021-01-25 DIAGNOSIS — K5909 Other constipation: Secondary | ICD-10-CM | POA: Diagnosis not present

## 2021-01-25 DIAGNOSIS — D62 Acute posthemorrhagic anemia: Secondary | ICD-10-CM | POA: Diagnosis not present

## 2021-01-25 DIAGNOSIS — I700 Atherosclerosis of aorta: Secondary | ICD-10-CM

## 2021-01-25 DIAGNOSIS — F339 Major depressive disorder, recurrent, unspecified: Secondary | ICD-10-CM

## 2021-01-25 DIAGNOSIS — E43 Unspecified severe protein-calorie malnutrition: Secondary | ICD-10-CM

## 2021-01-25 DIAGNOSIS — E785 Hyperlipidemia, unspecified: Secondary | ICD-10-CM

## 2021-01-25 DIAGNOSIS — Z8673 Personal history of transient ischemic attack (TIA), and cerebral infarction without residual deficits: Secondary | ICD-10-CM | POA: Diagnosis not present

## 2021-01-25 DIAGNOSIS — S72002G Fracture of unspecified part of neck of left femur, subsequent encounter for closed fracture with delayed healing: Secondary | ICD-10-CM

## 2021-01-25 MED ORDER — OXYCODONE HCL 5 MG PO TABS: 5.0000 mg | ORAL_TABLET | Freq: Four times a day (QID) | ORAL | 0 refills | Status: AC

## 2021-01-25 NOTE — Progress Notes (Signed)
Location:  [facility] Room Number: 154-W Place of Service:  SNF (31)   CODE STATUS: DNR  Allergies  Allergen Reactions  . Demerol [Meperidine] Anaphylaxis  . Hydrocodone-Acetaminophen Nausea And Vomiting    Chief Complaint  Patient presents with  . Hospitalization Follow-up    Hospital follow-up.    HPI:  She is a 87 year old woman who has been hospitalized from 01-18-21 through 01-21-21. Her medical history includes: hypertension; hyperlipidemia; anxiety; depression. She had a fall at home and suffered a left hip fracture. She had a IM nail performed on 01-20-21. She is here for short term rehab with her goal to return back home. She does have significant left hip pain states that it hurts "all the time". There are no reports of depressive thoughts. She will continue to be followed for her chronic illnesses including: Protein calorie malnutrition severe:   Aortic atherosclerosis Primary hypertension:    History of cva   Past Medical History:  Diagnosis Date  . HOH (hard of hearing)   . HTN (hypertension)   . Hyperlipemia   . Stroke [facility])     Past Surgical History:  Procedure Laterality Date  . BREAST SURGERY    . INTRAMEDULLARY (IM) NAIL INTERTROCHANTERIC Left 01/20/2021   Procedure: OPERATIVE FIXATION OF LEFT HIP FRACTURE;  Surgeon: Mordecai Rasmussen, MD;  Location: [facility];  Service: Orthopedics;  Laterality: Left;  . LEG SURGERY      Social History   Socioeconomic History  . Marital status: Married    Spouse name: Not on file  . Number of children: Not on file  . Years of education: 46  . Highest education level: Not on file  Occupational History  . Not on file  Tobacco Use  . Smoking status: Never Smoker  . Smokeless tobacco: Never Used  Vaping Use  . Vaping Use: Never used  Substance and Sexual Activity  . Alcohol use: No  . Drug use: No  . Sexual activity: Not Currently    Birth control/protection: Post-menopausal  Other Topics Concern   . Not on file  Social History Narrative  . Not on file   Social Determinants of Health   Financial Resource Strain: Not on file  Food Insecurity: No Food Insecurity  . Worried About Charity fundraiser in the Last Year: Never true  . Ran Out of Food in the Last Year: Never true  Transportation Needs: No Transportation Needs  . Lack of Transportation (Medical): No  . Lack of Transportation (Non-Medical): No  Physical Activity: Inactive  . Days of Exercise per Week: 0 days  . Minutes of Exercise per Session: 20 min  Stress: No Stress Concern Present  . Feeling of Stress : Only a little  Social Connections: Moderately Isolated  . Frequency of Communication with Friends and Family: Twice a week  . Frequency of Social Gatherings with Friends and Family: Twice a week  . Attends Religious Services: Never  . Active Member of Clubs or Organizations: No  . Attends Archivist Meetings: Never  . Marital Status: Married  Human resources officer Violence: Not At Risk  . Fear of Current or Ex-Partner: No  . Emotionally Abused: No  . Physically Abused: No  . Sexually Abused: No   Family History  Problem Relation Age of Onset  . Heart disease Other   . Diabetes Other   . Stroke Mother       VITAL SIGNS BP 125/66   Pulse  74   Temp 97.9 F (36.6 C)   Ht 5\' 1"  (1.549 m)   Wt 104 lb (47.2 kg)   SpO2 95%   BMI 19.65 kg/m   Outpatient Encounter Medications as of 01/25/2021  Medication Sig  . acetaminophen (TYLENOL) 500 MG tablet Take 500 mg by mouth every 6 (six) hours as needed for mild pain or headache.  Marland Kitchen amLODipine (NORVASC) 5 MG tablet Take 0.5 tablets (2.5 mg total) by mouth daily.  Marland Kitchen aspirin EC 325 MG EC tablet Take 1 tablet (325 mg total) by mouth daily.  . clonazePAM (KLONOPIN) 0.5 MG tablet Take 0.5 tablets (0.25 mg total) by mouth 2 (two) times daily as needed for anxiety.  . feeding supplement (ENSURE ENLIVE / ENSURE PLUS) LIQD Take 237 mLs by mouth 3 (three) times  daily with meals.  Marland Kitchen oxyCODONE (OXY IR/ROXICODONE) 5 MG immediate release tablet Take 1 tablet (5 mg total) by mouth every 4 (four) hours as needed for moderate pain.  . polyethylene glycol (MIRALAX) 17 g packet Take 17 g by mouth daily.  . ramipril (ALTACE) 10 MG capsule Take 10 mg by mouth daily.  . sertraline (ZOLOFT) 50 MG tablet Take 1 tablet (50 mg total) by mouth daily.  . simvastatin (ZOCOR) 20 MG tablet Take 1 tablet (20 mg total) by mouth daily.  Marland Kitchen trolamine salicylate (ASPERCREME) 10 % cream Apply 1 application topically 2 (two) times daily as needed for muscle pain.   No facility-administered encounter medications on file as of 01/25/2021.     SIGNIFICANT DIAGNOSTIC EXAMS  TODAY  01-18-21: left knee x-ray:  1. Degenerative changes without an acute osseous abnormality. 2. Very small joint effusion.  01-19-21: left hip x-ray:  Acute, comminuted, displaced, angulated left intratrochanteric femoral neck fracture with avulsion of the lesser trochanter.  01-19-21: chest x-ray:  Nodular 2.3 cm left upper lobe opacity which may represent artifact due to patient rotation, consider further evaluation with chest CT.  LABS REVIEWED:   01-18-21: wbc 9.7; hgb 12.8; hct 38.4; mcv 94.3 plt 191; glucose 129; bun 14; creat 0.83; k+ 4.0; na++ 136; ca 8.9 GFR>60 01-19-21: wbc 6.9; hgb 10.9; hct 33.5; mcv 93.8 plt 171; glucose 119; bun 14; creat 0.71; k+ 3.9; na++ 132; ca 8.5; GFR>60  01-21-21: wbc 6.8; hgb 9.1; hct 28.1; mcv 94.6 plt 141; glucose 122; bun 11; creat 0.62; k+ 3.6; na++ 132; ca 8.3 GFR>60; mag 1.8    Review of Systems  Constitutional: Negative for malaise/fatigue.  Respiratory: Negative for cough and shortness of breath.   Cardiovascular: Negative for chest pain, palpitations and leg swelling.  Gastrointestinal: Negative for abdominal pain, constipation and heartburn.  Musculoskeletal: Positive for joint pain. Negative for back pain and myalgias.       Left hip and leg pain    Skin: Negative.   Neurological: Negative for dizziness.  Psychiatric/Behavioral: The patient is not nervous/anxious.     Physical Exam Constitutional:      General: She is not in acute distress.    Appearance: She is well-developed. She is not diaphoretic.     Comments: Frail   HENT:     Ears:     Comments: Very HOH Neck:     Thyroid: No thyromegaly.  Cardiovascular:     Rate and Rhythm: Normal rate and regular rhythm.     Pulses: Normal pulses.     Heart sounds: Normal heart sounds.  Pulmonary:     Effort: Pulmonary effort is normal. No respiratory distress.  Breath sounds: Normal breath sounds.  Abdominal:     General: Bowel sounds are normal. There is no distension.     Palpations: Abdomen is soft.     Tenderness: There is no abdominal tenderness.  Musculoskeletal:     Cervical back: Neck supple.     Right lower leg: No edema.     Left lower leg: No edema.     Comments: Is able to move all extremities  Status post left hip nail 01-20-21  Lymphadenopathy:     Cervical: No cervical adenopathy.  Skin:    General: Skin is warm and dry.     Comments: Left hip incision line without signs of infection present  Left heel with a blister Bilateral lower extremities discolored   Neurological:     Mental Status: She is alert and oriented to person, place, and time.  Psychiatric:        Mood and Affect: Mood normal.       ASSESSMENT/ PLAN:  TODAY   1. Closed fracture of left hip with delayed healing subsequent encounter: is status post left IM nail. Is stable will continue therapy as directed; will follow up with orthopedics. She is having significant pain in her left hip; Will change to oxycodone 5 mg every 6 hours through 02-03-21 and will begin robaxin 500 mg daily prior to therapy.   2. Protein calorie malnutrition severe: albumin 3.5 with a low protein; is on ensure three times daily and will begin prostat 30 mL twice daily will monitor her status.   3. Aortic  atherosclerosis (07-17-20) will monitor   4. Primary hypertension: is stable b/p 125/66 will continue norvasc 2.5 mg daily altace 10 mg daily asa 325 mg daily   5. History of cva (cerebrovascular disease) is stable will monitor   6. Chronic constipation: is stable will continue miralax daily   7. Major depression recurrent chronic: is stable will continue zoloft 50 mg daily and has klonopin 0.25 mg twice daily as needed through 02-04-21  8. Hyperlipidemia unspecified hyperlipidemia type: is stable will continue zocor 20 mg daily   9. Acute blood loss anemia: hgb 9.1 will monitor her labs  Will check cbc cmp.   Time spent with patient: 45 minutes: medication review; goals of care; therapy needs.    Ok Edwards NP [facility]  Contact [phone] Monday through Friday 8am- 5pm  After hours call [phone]

## 2021-01-27 ENCOUNTER — Non-Acute Institutional Stay (SKILLED_NURSING_FACILITY): Payer: Medicare Other | Admitting: Internal Medicine

## 2021-01-27 ENCOUNTER — Encounter: Payer: Self-pay | Admitting: Internal Medicine

## 2021-01-27 DIAGNOSIS — D62 Acute posthemorrhagic anemia: Secondary | ICD-10-CM

## 2021-01-27 DIAGNOSIS — E440 Moderate protein-calorie malnutrition: Secondary | ICD-10-CM | POA: Diagnosis not present

## 2021-01-27 DIAGNOSIS — I10 Essential (primary) hypertension: Secondary | ICD-10-CM

## 2021-01-27 DIAGNOSIS — S72002G Fracture of unspecified part of neck of left femur, subsequent encounter for closed fracture with delayed healing: Secondary | ICD-10-CM | POA: Diagnosis not present

## 2021-01-27 NOTE — Patient Instructions (Signed)
See assessment and plan under each diagnosis in the problem list and acutely for this visit 

## 2021-01-27 NOTE — Progress Notes (Signed)
NURSING HOME LOCATION:  [facility] ROOM NUMBER:  154 W  CODE STATUS:  DNR  PCP: Asencion Noble MD  This is a comprehensive admission note to this SNFperformed on this date less than 30 days from date of admission. Included are preadmission medical/surgical history; reconciled medication list; family history; social history and comprehensive review of systems.  Corrections and additions to the records were documented. Comprehensive physical exam was also performed. Additionally a clinical summary was entered for each active diagnosis pertinent to this admission in the Problem List to enhance continuity of care.  HPI: Patient was hospitalized 5/16 - 01/21/2021 for surgical treatment of left intertrochanteric hip fracture sustained in a fall.  The exact nature of the fall was difficult to discern as the patient is profoundly hard of hearing.  Dr. Amedeo Kinsman completed cephalomedullary nailing of the left intertrochanteric femur fracture 5/10 without complication. Post discharge she was to continue 325 mg of aspirin as prophylaxis.  Surgical staples were to be removed in 2 weeks.  Orthopedic follow-up was to be in 6 weeks.  Past medical and surgical history: Includes history of stroke, history of humeral fracture, essential hypertension, and dyslipidemia. Surgeries and procedures include breast surgery.  Social history: Nondrinker; never smoked.  Family history: Noncontributory due to advanced age.   Review of systems: Communications was virtually impossible due to profound auditory deficit despite wearing a hearing aid on the left.  She states that she has a right hearing aid but needs a battery.  She states that she is "not good today"; she describes being "sick on her stomach".  She states "constant pain in leg causes pain all over".  Physical exam:  Pertinent or positive findings: She appears chronically ill and malnourished.  As noted she is profoundly hard of hearing,  essentially stone deaf despite wearing a hearing aid on the left.  Eyebrows are absent.  Her eyes are sunken and there is temporal wasting present.  The maxilla is edentulous.  The lower teeth are coated. There is a slight gallop cadence with accentuation of both heart sounds.  Breath sounds are decreased.  Dorsalis pedis pulses are stronger than the posterior tibial pulses.  There is interosseous wasting and the limbs are atrophic.  As I lifted the linens off the legs she began to cry out in pain.  She complained of increased pain with light palpation of the dorsum of the feet for pulse assessment.  There is marked deformity of the toes.  The left great toe overlaps the second toe and the fifth toe overlaps the fourth.  The toes themselves are elongated.  On the right , the great toe overlaps the second and the right third toe partially overlaps the second toe.  General appearance: no acute distress, increased work of breathing is present.   Lymphatic: No lymphadenopathy about the head, neck, axilla. Eyes: No conjunctival inflammation or lid edema is present. There is no scleral icterus. Ears:  External ear exam shows no significant lesions or deformities.   Nose:  External nasal examination shows no deformity or inflammation. Nasal mucosa are pink and moist without lesions, exudates Neck:  No thyromegaly, masses, tenderness noted.    Heart:  No murmur, click, rub.  Lungs:  without wheezes, rhonchi, rales, rubs. Abdomen: Bowel sounds are normal.  Abdomen is soft and nontender with no organomegaly, hernias, masses. GU: Deferred  Extremities:  No cyanosis, clubbing, edema. Neurologic exam:Balance, Rhomberg, finger to nose testing could not be completed due to  clinical state Skin: Warm & dry w/o tenting. No significant lesions or rash.  See clinical summary under each active problem in the Problem List with associated updated therapeutic plan

## 2021-01-27 NOTE — Assessment & Plan Note (Addendum)
PT /OT @ SNF with [facility] Wound Care Nurse reports stapled surgical op site clean

## 2021-01-27 NOTE — Assessment & Plan Note (Signed)
BP controlled; no change in antihypertensive medications  

## 2021-01-27 NOTE — Assessment & Plan Note (Signed)
Current albumi is low normal at 3.5; total protein is 5.8.  Nutritionist to monitor at [facility].

## 2021-01-27 NOTE — Assessment & Plan Note (Signed)
Preop H/H12.8/38.4; postop 9.1/28.1. No bleeding dyscrasias reported at the SNF.  CBC will be monitored and iron supplement initiated as clinically indicated.

## 2021-02-01 ENCOUNTER — Other Ambulatory Visit (HOSPITAL_COMMUNITY)
Admission: RE | Admit: 2021-02-01 | Discharge: 2021-02-01 | Disposition: A | Discharge status: Home / Self Care | Payer: Medicare Other | Source: Skilled Nursing Facility | Attending: Adult Health | Admitting: Adult Health

## 2021-02-01 DIAGNOSIS — S72142D Displaced intertrochanteric fracture of left femur, subsequent encounter for closed fracture with routine healing: Secondary | ICD-10-CM | POA: Insufficient documentation

## 2021-02-01 LAB — COMPREHENSIVE METABOLIC PANEL
ALT: 9 U/L (ref 0–44)
AST: 18 U/L (ref 15–41)
Albumin: 2.5 g/dL — ABNORMAL LOW (ref 3.5–5.0)
Alkaline Phosphatase: 49 U/L (ref 38–126)
Anion gap: 9 (ref 5–15)
BUN: 16 mg/dL (ref 8–23)
CO2: 25 mmol/L (ref 22–32)
Calcium: 7.8 mg/dL — ABNORMAL LOW (ref 8.9–10.3)
Chloride: 90 mmol/L — ABNORMAL LOW (ref 98–111)
Creatinine, Ser: 0.67 mg/dL (ref 0.44–1.00)
GFR, Estimated: >60 mL/min (ref >60)
Glucose, Bld: 94 mg/dL (ref 70–99)
Potassium: 4.1 mmol/L (ref 3.5–5.1)
Sodium: 124 mmol/L — ABNORMAL LOW (ref 135–145)
Total Bilirubin: 1.1 mg/dL (ref 0.3–1.2)
Total Protein: 5.2 g/dL — ABNORMAL LOW (ref 6.5–8.1)

## 2021-02-01 LAB — CBC
HCT: 27.4 % — ABNORMAL LOW (ref 36.0–46.0)
Hemoglobin: 9.2 g/dL — ABNORMAL LOW (ref 12.0–15.0)
MCH: 31.5 pg (ref 26.0–34.0)
MCHC: 33.6 g/dL (ref 30.0–36.0)
MCV: 93.8 fL (ref 80.0–100.0)
Platelets: 345 10*3/uL (ref 150–400)
RBC: 2.92 MIL/uL — ABNORMAL LOW (ref 3.87–5.11)
RDW: 15.2 % (ref 11.5–15.5)
WBC: 6.8 10*3/uL (ref 4.0–10.5)
nRBC: 0 % (ref 0.0–0.2)

## 2021-02-01 LAB — D-DIMER, QUANTITATIVE: D-Dimer, Quant: 8.24 ug/mL-FEU — ABNORMAL HIGH (ref 0.00–0.50)

## 2021-02-02 ENCOUNTER — Telehealth: Payer: Self-pay | Admitting: Orthopedic Surgery

## 2021-02-02 ENCOUNTER — Encounter: Payer: Self-pay | Admitting: Adult Health

## 2021-02-02 ENCOUNTER — Other Ambulatory Visit: Payer: Self-pay | Admitting: Adult Health

## 2021-02-02 ENCOUNTER — Other Ambulatory Visit (HOSPITAL_COMMUNITY)
Admission: RE | Admit: 2021-02-02 | Discharge: 2021-02-02 | Disposition: A | Discharge status: Home / Self Care | Payer: Medicare Other | Source: Skilled Nursing Facility | Attending: Adult Health | Admitting: Adult Health

## 2021-02-02 ENCOUNTER — Encounter: Payer: Medicare Other | Admitting: Orthopedic Surgery

## 2021-02-02 ENCOUNTER — Non-Acute Institutional Stay (SKILLED_NURSING_FACILITY): Payer: Medicare Other | Admitting: Adult Health

## 2021-02-02 DIAGNOSIS — J1282 Pneumonia due to coronavirus disease 2019: Secondary | ICD-10-CM

## 2021-02-02 DIAGNOSIS — Z66 Do not resuscitate: Secondary | ICD-10-CM | POA: Diagnosis not present

## 2021-02-02 DIAGNOSIS — D6869 Other thrombophilia: Secondary | ICD-10-CM

## 2021-02-02 DIAGNOSIS — U071 COVID-19: Secondary | ICD-10-CM | POA: Diagnosis not present

## 2021-02-02 LAB — C-REACTIVE PROTEIN: CRP: 6.9 mg/dL — ABNORMAL HIGH (ref <1.0)

## 2021-02-02 MED ORDER — OXYCODONE HCL 5 MG PO TABS: 5.0000 mg | ORAL_TABLET | Freq: Two times a day (BID) | ORAL | 0 refills | Status: AC

## 2021-02-02 NOTE — Telephone Encounter (Signed)
Spoke with nurse at facility and verbal order given for staple removal if wound looks well enough to remove. Discussed f/u and possible x-rays, pt will keep appointment for 03/05/21 .

## 2021-02-02 NOTE — Telephone Encounter (Signed)
Call received from Santiago Glad at [facility], relaying that patient tested positive for covid-19; therefore, will not be attending today's (02/02/21) appointment. Please advise if okay for patient's nurse to remove staples. Also, we have not yet taken today's appointment off in event there may be a need for a telephone visit. Also, Santiago Glad relays that facility indicates patient is to have a 6 week follow up appointment as well, for 03/05/21, at 10:30am; please verify so that we may add onto appointment schedule.  [place], [place], ph# [phone]; said to ask for patient's nurse.

## 2021-02-02 NOTE — Progress Notes (Signed)
Location:  [facility] Room Number: [place] of Service:  SNF (31)   CODE STATUS: DNR  Allergies  Allergen Reactions  . Demerol [Meperidine] Anaphylaxis  . Hydrocodone-Acetaminophen Nausea And Vomiting    Chief Complaint  Patient presents with  . Acute Visit    Positive covid consult     HPI:  She has tested positive with covid 50. She was started on vitd; zinc and vitd. Her d-dimer is 8.24 she will need to be stared on anticoagulation therapy. He is complaining of cough; shortness of breath wheezing; and sore throat. There are no reports of fevers present. I have spoken with pharmacy will start her on antivirals.   Past Medical History:  Diagnosis Date  . HOH (hard of hearing)   . HTN (hypertension)   . Hyperlipemia   . Proximal humerus fracture 03/13/2012   doi 03-13-2012 left    . Stroke [facility])     Past Surgical History:  Procedure Laterality Date  . BREAST SURGERY    . INTRAMEDULLARY (IM) NAIL INTERTROCHANTERIC Left 01/20/2021   Procedure: OPERATIVE FIXATION OF LEFT HIP FRACTURE;  Surgeon: Mordecai Rasmussen, MD;  Location: [facility];  Service: Orthopedics;  Laterality: Left;  . LEG SURGERY      Social History   Socioeconomic History  . Marital status: Married    Spouse name: Not on file  . Number of children: Not on file  . Years of education: 50  . Highest education level: Not on file  Occupational History  . Not on file  Tobacco Use  . Smoking status: Never Smoker  . Smokeless tobacco: Never Used  Vaping Use  . Vaping Use: Never used  Substance and Sexual Activity  . Alcohol use: No  . Drug use: No  . Sexual activity: Not Currently    Birth control/protection: Post-menopausal  Other Topics Concern  . Not on file  Social History Narrative  . Not on file   Social Determinants of Health   Financial Resource Strain: Not on file  Food Insecurity: No Food Insecurity  . Worried About Charity fundraiser in the Last Year: Never true   . Ran Out of Food in the Last Year: Never true  Transportation Needs: No Transportation Needs  . Lack of Transportation (Medical): No  . Lack of Transportation (Non-Medical): No  Physical Activity: Inactive  . Days of Exercise per Week: 0 days  . Minutes of Exercise per Session: 20 min  Stress: No Stress Concern Present  . Feeling of Stress : Only a little  Social Connections: Moderately Isolated  . Frequency of Communication with Friends and Family: Twice a week  . Frequency of Social Gatherings with Friends and Family: Twice a week  . Attends Religious Services: Never  . Active Member of Clubs or Organizations: No  . Attends Archivist Meetings: Never  . Marital Status: Married  Human resources officer Violence: Not At Risk  . Fear of Current or Ex-Partner: No  . Emotionally Abused: No  . Physically Abused: No  . Sexually Abused: No   Family History  Problem Relation Age of Onset  . Heart disease Other   . Diabetes Other   . Stroke Mother       VITAL SIGNS BP 112/66   Pulse 82   Temp 99 F (37.2 C)   Resp 20   SpO2 90%   Outpatient Encounter Medications as of 02/02/2021  Medication Sig Note  . acetaminophen (TYLENOL)  500 MG tablet Take 500 mg by mouth every 6 (six) hours as needed for mild pain or headache.   . Amino Acids-Protein Hydrolys (FEEDING SUPPLEMENT, PRO-STAT SUGAR FREE 64,) LIQD Take 30 mLs by mouth in the morning and at bedtime.   Marland Kitchen amLODipine (NORVASC) 2.5 MG tablet Take 2.5 mg by mouth daily.   Marland Kitchen apixaban (ELIQUIS) 5 MG TABS tablet Take 5 mg by mouth 2 (two) times daily.   Roseanne Kaufman Peru-Castor Oil (VENELEX) OINT Apply 1 application topically in the morning, at noon, and at bedtime. Every Shift; Day, Evening, Night apply to sacrum and bilateral buttocks   . Cholecalciferol (VITAMIN D3) 1.25 MG (50000 UT) CAPS Take 1 capsule by mouth once a week. On Monday   . clonazePAM (KLONOPIN) 0.5 MG tablet Take 0.5 tablets (0.25 mg total) by mouth 2 (two) times  daily as needed for anxiety.   . feeding supplement (ENSURE ENLIVE / ENSURE PLUS) LIQD Take 237 mLs by mouth 3 (three) times daily with meals.   Marland Kitchen lisinopril (ZESTRIL) 40 MG tablet Take 40 mg by mouth daily. (FORMULARY SUB FOR RAMIPRIL 10MG )   . methocarbamol (ROBAXIN) 500 MG tablet Take 500 mg by mouth daily as needed for muscle spasms. Give prior to therapy 01/27/2021: 1 pre PT/OT  . ondansetron (ZOFRAN-ODT) 4 MG disintegrating tablet Take 4 mg by mouth every 6 (six) hours as needed for nausea or vomiting.   . polyethylene glycol (MIRALAX) 17 g packet Take 17 g by mouth daily.   . sertraline (ZOLOFT) 50 MG tablet Take 1 tablet (50 mg total) by mouth daily.   . simvastatin (ZOCOR) 20 MG tablet Take 1 tablet (20 mg total) by mouth daily.   Marland Kitchen trolamine salicylate (ASPERCREME) 10 % cream Apply 1 application topically 2 (two) times daily as needed for muscle pain.   Marland Kitchen UNABLE TO FIND Diet: NAS   . vitamin C (ASCORBIC ACID) 500 MG tablet Take 1,000 mg by mouth daily.   Marland Kitchen ZINC-VITAMIN C PO Take 1 lozenge by mouth 5 (five) times daily.   . [DISCONTINUED] oxyCODONE (OXY IR/ROXICODONE) 5 MG immediate release tablet Take 5 mg by mouth every 6 (six) hours. For displaced intertrochanteric fracture of left femur, subsequent encounter for closed fracture with routine healing   . [DISCONTINUED] amLODipine (NORVASC) 5 MG tablet Take 0.5 tablets (2.5 mg total) by mouth daily.   . [DISCONTINUED] aspirin EC 325 MG EC tablet Take 1 tablet (325 mg total) by mouth daily.   . [DISCONTINUED] ramipril (ALTACE) 10 MG capsule Take 10 mg by mouth daily.    No facility-administered encounter medications on file as of 02/02/2021.     SIGNIFICANT DIAGNOSTIC EXAMS  PREVIOUS   01-18-21: left knee x-ray:  1. Degenerative changes without an acute osseous abnormality. 2. Very small joint effusion.  01-19-21: left hip x-ray:  Acute, comminuted, displaced, angulated left intratrochanteric femoral neck fracture with avulsion of  the lesser trochanter.  01-19-21: chest x-ray:  Nodular 2.3 cm left upper lobe opacity which may represent artifact due to patient rotation, consider further evaluation with chest CT.  NO NEW EXAMS   LABS REVIEWED: PREVIOUS   01-18-21: wbc 9.7; hgb 12.8; hct 38.4; mcv 94.3 plt 191; glucose 129; bun 14; creat 0.83; k+ 4.0; na++ 136; ca 8.9 GFR>60 01-19-21: wbc 6.9; hgb 10.9; hct 33.5; mcv 93.8 plt 171; glucose 119; bun 14; creat 0.71; k+ 3.9; na++ 132; ca 8.5; GFR>60  01-21-21: wbc 6.8; hgb 9.1; hct 28.1; mcv 94.6 plt  141; glucose 122; bun 11; creat 0.62; k+ 3.6; na++ 132; ca 8.3 GFR>60; mag 1.8   TODAY   02-01-21: wbc 6.8; hgb 9.2; hct 27.4  mcv 93.8 plt 345; glucose 94; bun 16; creat 0.67; k+ 4.1; na++ 124; ca 7.8; GFR>60; liver normal albumin 2.5 d-dimer: 8.24 CRP 6.9   Review of Systems  Constitutional: Negative for malaise/fatigue.  HENT: Positive for sore throat. Negative for congestion.   Respiratory: Positive for cough, sputum production, shortness of breath and wheezing.   Cardiovascular: Negative for chest pain, palpitations and leg swelling.  Gastrointestinal: Negative for abdominal pain, constipation and heartburn.  Musculoskeletal: Negative for back pain, joint pain and myalgias.  Skin: Negative.   Neurological: Negative for dizziness.  Psychiatric/Behavioral: The patient is not nervous/anxious.     Physical Exam Constitutional:      General: She is not in acute distress.    Appearance: She is well-developed. She is not diaphoretic.     Comments: Frail   HENT:     Ears:     Comments: HOH Neck:     Thyroid: No thyromegaly.  Cardiovascular:     Rate and Rhythm: Normal rate and regular rhythm.     Heart sounds: Normal heart sounds.  Pulmonary:     Effort: Pulmonary effort is normal. No respiratory distress.     Breath sounds: Wheezing present.  Abdominal:     General: Bowel sounds are normal. There is no distension.     Palpations: Abdomen is soft.     Tenderness:  There is no abdominal tenderness.  Musculoskeletal:     Right lower leg: No edema.     Left lower leg: No edema.     Comments: Is able to move all extremities  Status post left hip nail 01-20-21   Lymphadenopathy:     Cervical: No cervical adenopathy.  Skin:    General: Skin is warm and dry.     Comments: Left hip incision line without signs of infection present  Left heel with a blister Bilateral lower extremities discolored    Neurological:     Mental Status: She is alert and oriented to person, place, and time.  Psychiatric:        Mood and Affect: Mood normal.      ASSESSMENT/ PLAN:  TODAY  1. Hypercoagulable state associated with covid 19 2. Lab test positive for detection of  covid 19 virus 3. Pneumonia due to covid 19 virus  Will begin the following:  eliquis 5 mg twice daily will repeat d-dimer: 02-08-21 Will hold statin and norvasc while on paxlovid  Will lower oxycodone to 5 mg twice daily  Will begin paxlovid 150/100 mg twice daily through 02-06-21 Will begin doxycycline 100 mg twice daily through 02-12-21 Will being nacl 1 gm twice daily for na++ of 124  Chest x-ray results pending.   Time spent with patient 45 minutes: plan of care; coordination of care.    Ok Edwards NP [facility]  Contact [phone] Monday through Friday 8am- 5pm  After hours call [phone]

## 2021-02-03 DIAGNOSIS — U071 COVID-19: Secondary | ICD-10-CM | POA: Insufficient documentation

## 2021-02-03 DIAGNOSIS — J1282 Pneumonia due to coronavirus disease 2019: Secondary | ICD-10-CM | POA: Insufficient documentation

## 2021-02-03 DIAGNOSIS — D6869 Other thrombophilia: Secondary | ICD-10-CM | POA: Insufficient documentation

## 2021-02-04 ENCOUNTER — Non-Acute Institutional Stay (SKILLED_NURSING_FACILITY): Payer: Medicare Other | Admitting: Adult Health

## 2021-02-04 ENCOUNTER — Encounter: Payer: Self-pay | Admitting: Adult Health

## 2021-02-04 DIAGNOSIS — D6869 Other thrombophilia: Secondary | ICD-10-CM

## 2021-02-04 DIAGNOSIS — F339 Major depressive disorder, recurrent, unspecified: Secondary | ICD-10-CM

## 2021-02-04 DIAGNOSIS — I700 Atherosclerosis of aorta: Secondary | ICD-10-CM | POA: Diagnosis not present

## 2021-02-04 DIAGNOSIS — S72002G Fracture of unspecified part of neck of left femur, subsequent encounter for closed fracture with delayed healing: Secondary | ICD-10-CM | POA: Diagnosis not present

## 2021-02-04 DIAGNOSIS — U071 COVID-19: Secondary | ICD-10-CM

## 2021-02-04 NOTE — Progress Notes (Signed)
Location:  [facility] Room Number: 154-W Place of Service:  SNF (31)   CODE STATUS: DNR  Allergies  Allergen Reactions  . Demerol [Meperidine] Anaphylaxis  . Hydrocodone-Acetaminophen Nausea And Vomiting    Chief Complaint  Patient presents with  . Acute Visit    Care plan meeting.    HPI:  We have come together for her care plan meeting  BIMS 9/15; mood 9/30: not sleeping well; tires easily restless. She requires extensive assist of her adls. She is nonambulatory she feeds herself. She is incontinent of bladder and bowel. She is hard of hearing. She has declined prostat for her albumin of 2.5. she has tested positive for COVID; is on paxlovid; and doxycycline for her pneumonia. Therapy mod to max bed mobility adls; no ambulation. Mod/max for standing.    Dietary 118 pounds down slightly since admission; poor appetite; has supplements ordered.    She continues to be followed for her chronic illnesses including: Aortic atherosclerosis  Closed fracture of left hip with delayed healing subsequent encounter  Major depression recurrent   Hypercoagulable state associated with COVID 19   Past Medical History:  Diagnosis Date  . HOH (hard of hearing)   . HTN (hypertension)   . Hyperlipemia   . Proximal humerus fracture 03/13/2012   doi 03-13-2012 left    . Stroke [facility])     Past Surgical History:  Procedure Laterality Date  . BREAST SURGERY    . INTRAMEDULLARY (IM) NAIL INTERTROCHANTERIC Left 01/20/2021   Procedure: OPERATIVE FIXATION OF LEFT HIP FRACTURE;  Surgeon: Mordecai Rasmussen, MD;  Location: [facility];  Service: Orthopedics;  Laterality: Left;  . LEG SURGERY      Social History   Socioeconomic History  . Marital status: Married    Spouse name: Not on file  . Number of children: Not on file  . Years of education: 13  . Highest education level: Not on file  Occupational History  . Not on file  Tobacco Use  . Smoking status: Never Smoker  . Smokeless  tobacco: Never Used  Vaping Use  . Vaping Use: Never used  Substance and Sexual Activity  . Alcohol use: No  . Drug use: No  . Sexual activity: Not Currently    Birth control/protection: Post-menopausal  Other Topics Concern  . Not on file  Social History Narrative  . Not on file   Social Determinants of Health   Financial Resource Strain: Not on file  Food Insecurity: No Food Insecurity  . Worried About Charity fundraiser in the Last Year: Never true  . Ran Out of Food in the Last Year: Never true  Transportation Needs: No Transportation Needs  . Lack of Transportation (Medical): No  . Lack of Transportation (Non-Medical): No  Physical Activity: Inactive  . Days of Exercise per Week: 0 days  . Minutes of Exercise per Session: 20 min  Stress: No Stress Concern Present  . Feeling of Stress : Only a little  Social Connections: Moderately Isolated  . Frequency of Communication with Friends and Family: Twice a week  . Frequency of Social Gatherings with Friends and Family: Twice a week  . Attends Religious Services: Never  . Active Member of Clubs or Organizations: No  . Attends Archivist Meetings: Never  . Marital Status: Married  Human resources officer Violence: Not At Risk  . Fear of Current or Ex-Partner: No  . Emotionally Abused: No  . Physically Abused: No  .  Sexually Abused: No   Family History  Problem Relation Age of Onset  . Heart disease Other   . Diabetes Other   . Stroke Mother       VITAL SIGNS BP 112/66   Pulse 82   Temp 99 F (37.2 C)   Resp 20   Ht 5\' 1"  (1.549 m)   SpO2 95%   BMI 19.65 kg/m   Outpatient Encounter Medications as of 02/04/2021  Medication Sig Note  . acetaminophen (TYLENOL) 500 MG tablet Take 500 mg by mouth every 6 (six) hours as needed for mild pain or headache.   . Amino Acids-Protein Hydrolys (FEEDING SUPPLEMENT, PRO-STAT SUGAR FREE 64,) LIQD Take 30 mLs by mouth in the morning and at bedtime.   Marland Kitchen apixaban (ELIQUIS)  5 MG TABS tablet Take 5 mg by mouth 2 (two) times daily.   Roseanne Kaufman [place]-Castor Oil (VENELEX) OINT Apply 1 application topically in the morning, at noon, and at bedtime. Every Shift; Day, Evening, Night apply to sacrum and bilateral buttocks   . Cholecalciferol (VITAMIN D3) 1.25 MG (50000 UT) CAPS Take 1 capsule by mouth once a week. On Monday   . clonazePAM (KLONOPIN) 0.5 MG tablet Take 0.5 tablets (0.25 mg total) by mouth 2 (two) times daily as needed for anxiety.   Marland Kitchen DOXYCYCLINE HYCLATE PO Take 100 mg by mouth 2 (two) times daily. Take 100 mg capsule  Special Instructions: for covid pneumonia   . feeding supplement (ENSURE ENLIVE / ENSURE PLUS) LIQD Take 237 mLs by mouth 3 (three) times daily with meals.   Marland Kitchen lisinopril (ZESTRIL) 40 MG tablet Take 40 mg by mouth daily. (FORMULARY SUB FOR RAMIPRIL 10MG )   . methocarbamol (ROBAXIN) 500 MG tablet Take 500 mg by mouth daily as needed for muscle spasms. Give prior to therapy 01/27/2021: 1 pre PT/OT  . Nirmatrelvir-Ritonavir (PAXLOVID PO) Take by mouth. 150 mg x 2- 100 mg; oral Special Instructions: DO NOT CRUSH OR CHEW FOR SYMPTOMATIC COVID; TAKE 2 TABLETS =300MG  (NIRMATRELVIR) ALONG WITH 1 TABLET =100MG  OF RITONAVIR BY MOUTH EVERY 12 HOURS FOR 5 DAYS WITH OR WITHOUT FOOD *DO NOT CRUSH* *NOTE DOSE* Every 12 Hours   . ondansetron (ZOFRAN-ODT) 4 MG disintegrating tablet Take 4 mg by mouth every 6 (six) hours as needed for nausea or vomiting.   Marland Kitchen oxyCODONE (OXY IR/ROXICODONE) 5 MG immediate release tablet Take 1 tablet (5 mg total) by mouth in the morning and at bedtime for 6 days. For displaced intertrochanteric fracture of left femur, subsequent encounter for closed fracture with routine healing   . polyethylene glycol (MIRALAX) 17 g packet Take 17 g by mouth daily.   . sertraline (ZOLOFT) 50 MG tablet Take 1 tablet (50 mg total) by mouth daily.   . sodium chloride 1 g tablet Take 1 g by mouth 2 (two) times daily. Special Instructions: for na++ 124    . trolamine salicylate (ASPERCREME) 10 % cream Apply 1 application topically 2 (two) times daily as needed for muscle pain.   Marland Kitchen UNABLE TO FIND Diet: NAS   . vitamin C (ASCORBIC ACID) 500 MG tablet Take 1,000 mg by mouth daily.   Marland Kitchen ZINC-VITAMIN C PO Take 1 lozenge by mouth 5 (five) times daily.   . [DISCONTINUED] amLODipine (NORVASC) 2.5 MG tablet Take 2.5 mg by mouth daily. (Patient not taking: Reported on 02/04/2021)   . [DISCONTINUED] simvastatin (ZOCOR) 20 MG tablet Take 1 tablet (20 mg total) by mouth daily. (Patient not taking: Reported on  02/04/2021)    No facility-administered encounter medications on file as of 02/04/2021.     SIGNIFICANT DIAGNOSTIC EXAMS   PREVIOUS   01-18-21: left knee x-ray:  1. Degenerative changes without an acute osseous abnormality. 2. Very small joint effusion.  01-19-21: left hip x-ray:  Acute, comminuted, displaced, angulated left intratrochanteric femoral neck fracture with avulsion of the lesser trochanter.  01-19-21: chest x-ray:  Nodular 2.3 cm left upper lobe opacity which may represent artifact due to patient rotation, consider further evaluation with chest CT.  NO NEW EXAMS   LABS REVIEWED: PREVIOUS   01-18-21: wbc 9.7; hgb 12.8; hct 38.4; mcv 94.3 plt 191; glucose 129; bun 14; creat 0.83; k+ 4.0; na++ 136; ca 8.9 GFR>60 01-19-21: wbc 6.9; hgb 10.9; hct 33.5; mcv 93.8 plt 171; glucose 119; bun 14; creat 0.71; k+ 3.9; na++ 132; ca 8.5; GFR>60  01-21-21: wbc 6.8; hgb 9.1; hct 28.1; mcv 94.6 plt 141; glucose 122; bun 11; creat 0.62; k+ 3.6; na++ 132; ca 8.3 GFR>60; mag 1.8  02-01-21: wbc 6.8; hgb 9.2; hct 27.4  mcv 93.8 plt 345; glucose 94; bun 16; creat 0.67; k+ 4.1; na++ 124; ca 7.8; GFR>60; liver normal albumin 2.5 d-dimer: 8.24 CRP 6.9  NO NEW LABS.    Review of Systems  Constitutional: Positive for malaise/fatigue.  Respiratory: Positive for cough, shortness of breath and wheezing.   Cardiovascular: Negative for chest pain, palpitations and leg  swelling.  Gastrointestinal: Negative for abdominal pain, constipation and heartburn.  Musculoskeletal: Negative for back pain, joint pain and myalgias.  Skin: Negative.   Neurological: Negative for dizziness.  Psychiatric/Behavioral: The patient is not nervous/anxious.    Physical Exam Constitutional:      General: She is not in acute distress.    Appearance: She is well-developed. She is not diaphoretic.     Comments: frail  HENT:     Ears:     Comments: HOH Neck:     Thyroid: No thyromegaly.  Cardiovascular:     Rate and Rhythm: Normal rate and regular rhythm.     Heart sounds: Normal heart sounds.  Pulmonary:     Effort: Pulmonary effort is normal. No respiratory distress.     Breath sounds: Wheezing present.  Abdominal:     General: Bowel sounds are normal. There is no distension.     Palpations: Abdomen is soft.     Tenderness: There is no abdominal tenderness.  Musculoskeletal:     Right lower leg: No edema.     Left lower leg: No edema.     Comments: Is able to move all extremities  Status post left hip nail 01-20-21    Lymphadenopathy:     Cervical: No cervical adenopathy.  Skin:    General: Skin is warm and dry.     Comments: Bilateral lower extremities discolored   Neurological:     Mental Status: She is alert. Mental status is at baseline.  Psychiatric:        Mood and Affect: Mood normal.       ASSESSMENT/ PLAN:  TODAY  1. Aortic atherosclerosis 2. Closed fracture of left hip with delayed healing subsequent encounter 3. Major depression recurrent 4. Hypercoagulable state associated with COVID 19   Will continue current medications Will continue current plan of care Will continue therapy as ordered Goal of care: to go home.   Time spent with patient: 40 minutes: coordination of care; therapy goals of care; medications.    Ok Edwards NP [facility] Adult  Medicine  Contact [phone] Monday through Friday 8am- 5pm  After hours call  [phone]

## 2021-02-08 ENCOUNTER — Encounter (HOSPITAL_COMMUNITY)
Admission: RE | Admit: 2021-02-08 | Discharge: 2021-02-08 | Disposition: A | Discharge status: Home / Self Care | Payer: Medicare Other | Source: Skilled Nursing Facility | Attending: Adult Health | Admitting: Adult Health

## 2021-02-08 ENCOUNTER — Other Ambulatory Visit: Payer: Self-pay | Admitting: *Deleted

## 2021-02-08 DIAGNOSIS — U071 COVID-19: Secondary | ICD-10-CM | POA: Diagnosis not present

## 2021-02-08 LAB — D-DIMER, QUANTITATIVE: D-Dimer, Quant: 2.64 ug/mL-FEU — ABNORMAL HIGH (ref 0.00–0.50)

## 2021-02-08 NOTE — Patient Outreach (Signed)
Member screened for potential care coordination needs.   Mrs. Amy Sawyer resides in [facility]. Update received from SNF SW indicating transition plan is to return home. Transition date not set at this time.  Will plan outreach, as appropriate, to DPR/daughter Amy Sawyer to discuss potential care coordination needs. Member's PCP office has embedded [facility] care coordination team.   Will continue to follow while Amy Sawyer resides in SNF.    Amy Rolling, MSN, RN,BSN [place] Acute Care Coordinator [phone] [facility]) [phone]  (Toll free office)

## 2021-02-22 ENCOUNTER — Other Ambulatory Visit (HOSPITAL_COMMUNITY)
Admission: RE | Admit: 2021-02-22 | Discharge: 2021-02-22 | Disposition: A | Discharge status: Home / Self Care | Payer: Medicare Other | Source: Skilled Nursing Facility | Attending: Adult Health | Admitting: Adult Health

## 2021-02-22 DIAGNOSIS — U071 COVID-19: Secondary | ICD-10-CM | POA: Insufficient documentation

## 2021-02-22 LAB — D-DIMER, QUANTITATIVE: D-Dimer, Quant: 1.33 ug/mL-FEU — ABNORMAL HIGH (ref 0.00–0.50)

## 2021-02-23 ENCOUNTER — Encounter: Payer: Self-pay | Admitting: Adult Health

## 2021-02-23 ENCOUNTER — Other Ambulatory Visit: Payer: Self-pay | Admitting: Adult Health

## 2021-02-23 ENCOUNTER — Non-Acute Institutional Stay (SKILLED_NURSING_FACILITY): Payer: Medicare Other | Admitting: Adult Health

## 2021-02-23 DIAGNOSIS — D6869 Other thrombophilia: Secondary | ICD-10-CM | POA: Diagnosis not present

## 2021-02-23 DIAGNOSIS — I700 Atherosclerosis of aorta: Secondary | ICD-10-CM

## 2021-02-23 DIAGNOSIS — U071 COVID-19: Secondary | ICD-10-CM

## 2021-02-23 DIAGNOSIS — S72002G Fracture of unspecified part of neck of left femur, subsequent encounter for closed fracture with delayed healing: Secondary | ICD-10-CM | POA: Diagnosis not present

## 2021-02-23 DIAGNOSIS — J1282 Pneumonia due to coronavirus disease 2019: Secondary | ICD-10-CM

## 2021-02-23 MED ORDER — METHOCARBAMOL 500 MG PO TABS: 500.0000 mg | ORAL_TABLET | Freq: Every day | ORAL | 0 refills | Status: DC | PRN

## 2021-02-23 MED ORDER — SERTRALINE HCL 50 MG PO TABS: 50.0000 mg | ORAL_TABLET | Freq: Every day | ORAL | 0 refills | Status: DC

## 2021-02-23 MED ORDER — LISINOPRIL 40 MG PO TABS: 40.0000 mg | ORAL_TABLET | Freq: Every day | ORAL | 0 refills | Status: AC

## 2021-02-23 MED ORDER — SODIUM CHLORIDE 1 G PO TABS: 1.0000 g | ORAL_TABLET | Freq: Two times a day (BID) | ORAL | 0 refills | Status: DC

## 2021-02-23 MED ORDER — ONDANSETRON 4 MG PO TBDP: 4.0000 mg | ORAL_TABLET | Freq: Four times a day (QID) | ORAL | 0 refills | Status: DC | PRN

## 2021-02-23 MED ORDER — ELIQUIS 5 MG PO TABS: 5.0000 mg | ORAL_TABLET | Freq: Two times a day (BID) | ORAL | 0 refills | Status: DC

## 2021-02-23 NOTE — Progress Notes (Addendum)
Location:   [facility] Room Number: 154 Place of Service:  SNF (31)    CODE STATUS dnr  Allergies  Allergen Reactions   Demerol [Meperidine] Anaphylaxis   Hydrocodone-Acetaminophen Nausea And Vomiting    Chief Complaint  Patient presents with   Discharge Note    HPI:  She is being discharged to home with home health for pt/ot/rn. She will need a wheelchair; her medications written and will need to follow up with her medical provider. She had been hospitalized for a left hip fracture. She was admitted to this facility for short term rehab. She has participated in pt/ot. She did develop covid 19 and covid associated pneumonia for which she was treated without further complications. She is now ready to complete her therapy on a home health basis.    Past Medical History:  Diagnosis Date   HOH (hard of hearing)    HTN (hypertension)    Hyperlipemia    Proximal humerus fracture 03/13/2012   doi 03-13-2012 left     Stroke [facility])     Past Surgical History:  Procedure Laterality Date   BREAST SURGERY     INTRAMEDULLARY (IM) NAIL INTERTROCHANTERIC Left 01/20/2021   Procedure: OPERATIVE FIXATION OF LEFT HIP FRACTURE;  Surgeon: Mordecai Rasmussen, MD;  Location: [facility] ORS;  Service: Orthopedics;  Laterality: Left;   LEG SURGERY      Social History   Socioeconomic History   Marital status: Married    Spouse name: Not on file   Number of children: Not on file   Years of education: 10   Highest education level: Not on file  Occupational History   Not on file  Tobacco Use   Smoking status: Never   Smokeless tobacco: Never  Vaping Use   Vaping Use: Never used  Substance and Sexual Activity   Alcohol use: No   Drug use: No   Sexual activity: Not Currently    Birth control/protection: Post-menopausal  Other Topics Concern   Not on file  Social History Narrative   Not on file   Social Determinants of Health   Financial Resource Strain: Not on file   Food Insecurity: No Food Insecurity   Worried About Running Out of Food in the Last Year: Never true   Ran Out of Food in the Last Year: Never true  Transportation Needs: No Transportation Needs   Lack of Transportation (Medical): No   Lack of Transportation (Non-Medical): No  Physical Activity: Inactive   Days of Exercise per Week: 0 days   Minutes of Exercise per Session: 20 min  Stress: No Stress Concern Present   Feeling of Stress : Only a little  Social Connections: Moderately Isolated   Frequency of Communication with Friends and Family: Twice a week   Frequency of Social Gatherings with Friends and Family: Twice a week   Attends Religious Services: Never   Marine scientist or Organizations: No   Attends Music therapist: Never   Marital Status: Married  Human resources officer Violence: Not At Risk   Fear of Current or Ex-Partner: No   Emotionally Abused: No   Physically Abused: No   Sexually Abused: No   Family History  Problem Relation Age of Onset   Heart disease Other    Diabetes Other    Stroke Mother     VITAL SIGNS BP (!) 119/56   Pulse 69   Temp 98.1 F (36.7 C)   Resp 16  Ht 5\' 1"  (1.549 m)   Wt 110 lb (49.9 kg)   SpO2 97%   BMI 20.78 kg/m   Patient's Medications  New Prescriptions   No medications on file  Previous Medications   ACETAMINOPHEN (TYLENOL) 500 MG TABLET    Take 500 mg by mouth every 6 (six) hours as needed for mild pain or headache.   AMINO ACIDS-PROTEIN HYDROLYS (FEEDING SUPPLEMENT, PRO-STAT SUGAR FREE 64,) LIQD    Take 30 mLs by mouth in the morning and at bedtime.   APIXABAN (ELIQUIS) 5 MG TABS TABLET    Take 5 mg by mouth 2 (two) times daily.   BALSAM PERU-CASTOR OIL (VENELEX) OINT    Apply 1 application topically in the morning, at noon, and at bedtime. Every Shift; Day, Evening, Night apply to sacrum and bilateral buttocks   FEEDING SUPPLEMENT (ENSURE ENLIVE / ENSURE PLUS) LIQD    Take 237 mLs by mouth 3 (three)  times daily with meals.   LISINOPRIL (ZESTRIL) 40 MG TABLET    Take 40 mg by mouth daily. (FORMULARY SUB FOR RAMIPRIL 10MG )   METHOCARBAMOL (ROBAXIN) 500 MG TABLET    Take 500 mg by mouth daily as needed for muscle spasms. Give prior to therapy   ONDANSETRON (ZOFRAN-ODT) 4 MG DISINTEGRATING TABLET    Take 4 mg by mouth every 6 (six) hours as needed for nausea or vomiting.   POLYETHYLENE GLYCOL (MIRALAX) 17 G PACKET    Take 17 g by mouth daily.   SERTRALINE (ZOLOFT) 50 MG TABLET    Take 1 tablet (50 mg total) by mouth daily.   SODIUM CHLORIDE 1 G TABLET    Take 1 g by mouth 2 (two) times daily. Special Instructions: for na++ 620   TROLAMINE SALICYLATE (ASPERCREME) 10 % CREAM    Apply 1 application topically 2 (two) times daily as needed for muscle pain.   UNABLE TO FIND    Diet: NAS  Modified Medications   No medications on file  Discontinued Medications   CLONAZEPAM (KLONOPIN) 0.5 MG TABLET    Take 0.5 tablets (0.25 mg total) by mouth 2 (two) times daily as needed for anxiety.   DOXYCYCLINE HYCLATE PO    Take 100 mg by mouth 2 (two) times daily. Take 100 mg capsule  Special Instructions: for covid pneumonia   NIRMATRELVIR-RITONAVIR (PAXLOVID PO)    Take by mouth. 150 mg x 2- 100 mg; oral Special Instructions: DO NOT CRUSH OR CHEW FOR SYMPTOMATIC COVID; TAKE 2 TABLETS =300MG  (NIRMATRELVIR) ALONG WITH 1 TABLET =100MG  OF RITONAVIR BY MOUTH EVERY 12 HOURS FOR 5 DAYS WITH OR WITHOUT FOOD *DO NOT CRUSH* *NOTE DOSE* Every 12 Hours     SIGNIFICANT DIAGNOSTIC EXAMS   PREVIOUS   01-18-21: left knee x-ray:  1. Degenerative changes without an acute osseous abnormality. 2. Very small joint effusion.  01-19-21: left hip x-ray:  Acute, comminuted, displaced, angulated left intratrochanteric femoral neck fracture with avulsion of the lesser trochanter.  01-19-21: chest x-ray:  Nodular 2.3 cm left upper lobe opacity which may represent artifact due to patient rotation, consider further evaluation  with chest CT.  NO NEW EXAMS   LABS REVIEWED: PREVIOUS   01-18-21: wbc 9.7; hgb 12.8; hct 38.4; mcv 94.3 plt 191; glucose 129; bun 14; creat 0.83; k+ 4.0; na++ 136; ca 8.9 GFR>60 01-19-21: wbc 6.9; hgb 10.9; hct 33.5; mcv 93.8 plt 171; glucose 119; bun 14; creat 0.71; k+ 3.9; na++ 132; ca 8.5; GFR>60  01-21-21: wbc 6.8; hgb  9.1; hct 28.1; mcv 94.6 plt 141; glucose 122; bun 11; creat 0.62; k+ 3.6; na++ 132; ca 8.3 GFR>60; mag 1.8  02-01-21: wbc 6.8; hgb 9.2; hct 27.4  mcv 93.8 plt 345; glucose 94; bun 16; creat 0.67; k+ 4.1; na++ 124; ca 7.8; GFR>60; liver normal albumin 2.5 d-dimer: 8.24 CRP 6.9  NO NEW LABS.    Review of Systems  Constitutional:  Negative for malaise/fatigue.  Respiratory:  Negative for cough and shortness of breath.   Cardiovascular:  Negative for chest pain, palpitations and leg swelling.  Gastrointestinal:  Negative for abdominal pain, constipation and heartburn.  Musculoskeletal:  Negative for back pain, joint pain and myalgias.  Skin: Negative.   Neurological:  Negative for dizziness.  Psychiatric/Behavioral:  The patient is not nervous/anxious.    Physical Exam Constitutional:      General: She is not in acute distress.    Appearance: She is well-developed. She is not diaphoretic.     Comments: Frail   HENT:     Ears:     Comments: HOH Neck:     Thyroid: No thyromegaly.  Cardiovascular:     Rate and Rhythm: Normal rate and regular rhythm.     Heart sounds: Normal heart sounds.  Pulmonary:     Effort: Pulmonary effort is normal. No respiratory distress.     Breath sounds: Normal breath sounds.  Abdominal:     General: Bowel sounds are normal. There is no distension.     Palpations: Abdomen is soft.     Tenderness: There is no abdominal tenderness.  Musculoskeletal:     Cervical back: Neck supple.     Right lower leg: No edema.     Left lower leg: No edema.     Comments:  Is able to move all extremities  Status post left hip nail 01-20-21      Lymphadenopathy:     Cervical: No cervical adenopathy.  Skin:    General: Skin is warm and dry.     Comments:  Bilateral lower extremities discolored    Neurological:     Mental Status: She is alert. Mental status is at baseline.  Psychiatric:        Mood and Affect: Mood normal.     ASSESSMENT/ PLAN:    Patient is being discharged with the following home health services:  pt/ot/rn: to evaluate and treat as indicated for gait balance strength adl training medication management.   Patient is being discharged with the following durable medical equipment:  wheelchair to allow her to maintain her current level of independence with her adls which cannot be achieved with a cane walker crutches; she iw able to self propel wheelchair.   Patient has been advised to f/u with their PCP in 1-2 weeks to bring them up to date on their rehab stay.  Social services at facility was responsible for arranging this appointment.  Pt was provided with a 30 day supply of prescriptions for medications and refills must be obtained from their PCP.  For controlled substances, a more limited supply may be provided adequate until PCP appointment only.  A 30 day supply of her prescription medications have been sent to [facility] [address].   She will need another 14 days of eliquis due to her elevated d-dimer from having covid 19.   Time spent with patient 45 minutes: dme; medications home health needs.    Ok Edwards NP [facility] Adult Medicine  Contact [phone] Monday through Friday 8am- 5pm  After hours call [phone]

## 2021-02-25 DIAGNOSIS — Z993 Dependence on wheelchair: Secondary | ICD-10-CM | POA: Diagnosis not present

## 2021-02-25 DIAGNOSIS — I10 Essential (primary) hypertension: Secondary | ICD-10-CM | POA: Diagnosis not present

## 2021-02-25 DIAGNOSIS — L89151 Pressure ulcer of sacral region, stage 1: Secondary | ICD-10-CM | POA: Diagnosis not present

## 2021-02-25 DIAGNOSIS — I700 Atherosclerosis of aorta: Secondary | ICD-10-CM | POA: Diagnosis not present

## 2021-02-25 DIAGNOSIS — L89622 Pressure ulcer of left heel, stage 2: Secondary | ICD-10-CM | POA: Diagnosis not present

## 2021-02-25 DIAGNOSIS — M1712 Unilateral primary osteoarthritis, left knee: Secondary | ICD-10-CM | POA: Diagnosis not present

## 2021-02-25 DIAGNOSIS — D62 Acute posthemorrhagic anemia: Secondary | ICD-10-CM | POA: Diagnosis not present

## 2021-02-25 DIAGNOSIS — Z7901 Long term (current) use of anticoagulants: Secondary | ICD-10-CM | POA: Diagnosis not present

## 2021-02-25 DIAGNOSIS — E785 Hyperlipidemia, unspecified: Secondary | ICD-10-CM | POA: Diagnosis not present

## 2021-02-25 DIAGNOSIS — S72142G Displaced intertrochanteric fracture of left femur, subsequent encounter for closed fracture with delayed healing: Secondary | ICD-10-CM | POA: Diagnosis not present

## 2021-02-25 DIAGNOSIS — K5909 Other constipation: Secondary | ICD-10-CM | POA: Diagnosis not present

## 2021-02-25 DIAGNOSIS — Z8673 Personal history of transient ischemic attack (TIA), and cerebral infarction without residual deficits: Secondary | ICD-10-CM | POA: Diagnosis not present

## 2021-02-25 DIAGNOSIS — F419 Anxiety disorder, unspecified: Secondary | ICD-10-CM | POA: Diagnosis not present

## 2021-02-25 DIAGNOSIS — W19XXXD Unspecified fall, subsequent encounter: Secondary | ICD-10-CM | POA: Diagnosis not present

## 2021-02-25 DIAGNOSIS — Z9181 History of falling: Secondary | ICD-10-CM | POA: Diagnosis not present

## 2021-02-25 DIAGNOSIS — F339 Major depressive disorder, recurrent, unspecified: Secondary | ICD-10-CM | POA: Diagnosis not present

## 2021-02-25 DIAGNOSIS — E43 Unspecified severe protein-calorie malnutrition: Secondary | ICD-10-CM | POA: Diagnosis not present

## 2021-03-02 DIAGNOSIS — S72142G Displaced intertrochanteric fracture of left femur, subsequent encounter for closed fracture with delayed healing: Secondary | ICD-10-CM | POA: Diagnosis not present

## 2021-03-02 DIAGNOSIS — D62 Acute posthemorrhagic anemia: Secondary | ICD-10-CM | POA: Diagnosis not present

## 2021-03-02 DIAGNOSIS — E43 Unspecified severe protein-calorie malnutrition: Secondary | ICD-10-CM | POA: Diagnosis not present

## 2021-03-02 DIAGNOSIS — L89622 Pressure ulcer of left heel, stage 2: Secondary | ICD-10-CM | POA: Diagnosis not present

## 2021-03-02 DIAGNOSIS — M1712 Unilateral primary osteoarthritis, left knee: Secondary | ICD-10-CM | POA: Diagnosis not present

## 2021-03-02 DIAGNOSIS — L89151 Pressure ulcer of sacral region, stage 1: Secondary | ICD-10-CM | POA: Diagnosis not present

## 2021-03-04 DIAGNOSIS — L89622 Pressure ulcer of left heel, stage 2: Secondary | ICD-10-CM | POA: Diagnosis not present

## 2021-03-04 DIAGNOSIS — S72142G Displaced intertrochanteric fracture of left femur, subsequent encounter for closed fracture with delayed healing: Secondary | ICD-10-CM | POA: Diagnosis not present

## 2021-03-04 DIAGNOSIS — L89151 Pressure ulcer of sacral region, stage 1: Secondary | ICD-10-CM | POA: Diagnosis not present

## 2021-03-04 DIAGNOSIS — D62 Acute posthemorrhagic anemia: Secondary | ICD-10-CM | POA: Diagnosis not present

## 2021-03-04 DIAGNOSIS — E43 Unspecified severe protein-calorie malnutrition: Secondary | ICD-10-CM | POA: Diagnosis not present

## 2021-03-04 DIAGNOSIS — M1712 Unilateral primary osteoarthritis, left knee: Secondary | ICD-10-CM | POA: Diagnosis not present

## 2021-03-05 DIAGNOSIS — D62 Acute posthemorrhagic anemia: Secondary | ICD-10-CM | POA: Diagnosis not present

## 2021-03-05 DIAGNOSIS — L89151 Pressure ulcer of sacral region, stage 1: Secondary | ICD-10-CM | POA: Diagnosis not present

## 2021-03-05 DIAGNOSIS — S72142G Displaced intertrochanteric fracture of left femur, subsequent encounter for closed fracture with delayed healing: Secondary | ICD-10-CM | POA: Diagnosis not present

## 2021-03-05 DIAGNOSIS — M1712 Unilateral primary osteoarthritis, left knee: Secondary | ICD-10-CM | POA: Diagnosis not present

## 2021-03-05 DIAGNOSIS — L89622 Pressure ulcer of left heel, stage 2: Secondary | ICD-10-CM | POA: Diagnosis not present

## 2021-03-05 DIAGNOSIS — E43 Unspecified severe protein-calorie malnutrition: Secondary | ICD-10-CM | POA: Diagnosis not present

## 2021-03-09 DIAGNOSIS — L89620 Pressure ulcer of left heel, unstageable: Secondary | ICD-10-CM | POA: Diagnosis not present

## 2021-03-09 DIAGNOSIS — S72002A Fracture of unspecified part of neck of left femur, initial encounter for closed fracture: Secondary | ICD-10-CM | POA: Diagnosis not present

## 2021-03-09 DIAGNOSIS — E440 Moderate protein-calorie malnutrition: Secondary | ICD-10-CM | POA: Diagnosis not present

## 2021-03-10 DIAGNOSIS — L89622 Pressure ulcer of left heel, stage 2: Secondary | ICD-10-CM | POA: Diagnosis not present

## 2021-03-10 DIAGNOSIS — M1712 Unilateral primary osteoarthritis, left knee: Secondary | ICD-10-CM | POA: Diagnosis not present

## 2021-03-10 DIAGNOSIS — D62 Acute posthemorrhagic anemia: Secondary | ICD-10-CM | POA: Diagnosis not present

## 2021-03-10 DIAGNOSIS — S72142G Displaced intertrochanteric fracture of left femur, subsequent encounter for closed fracture with delayed healing: Secondary | ICD-10-CM | POA: Diagnosis not present

## 2021-03-10 DIAGNOSIS — E43 Unspecified severe protein-calorie malnutrition: Secondary | ICD-10-CM | POA: Diagnosis not present

## 2021-03-10 DIAGNOSIS — L89151 Pressure ulcer of sacral region, stage 1: Secondary | ICD-10-CM | POA: Diagnosis not present

## 2021-03-11 ENCOUNTER — Other Ambulatory Visit (HOSPITAL_COMMUNITY): Payer: Self-pay | Admitting: Internal Medicine

## 2021-03-11 DIAGNOSIS — M1712 Unilateral primary osteoarthritis, left knee: Secondary | ICD-10-CM | POA: Diagnosis not present

## 2021-03-11 DIAGNOSIS — L89151 Pressure ulcer of sacral region, stage 1: Secondary | ICD-10-CM | POA: Diagnosis not present

## 2021-03-11 DIAGNOSIS — D62 Acute posthemorrhagic anemia: Secondary | ICD-10-CM | POA: Diagnosis not present

## 2021-03-11 DIAGNOSIS — E43 Unspecified severe protein-calorie malnutrition: Secondary | ICD-10-CM | POA: Diagnosis not present

## 2021-03-11 DIAGNOSIS — S72142G Displaced intertrochanteric fracture of left femur, subsequent encounter for closed fracture with delayed healing: Secondary | ICD-10-CM | POA: Diagnosis not present

## 2021-03-11 DIAGNOSIS — L89622 Pressure ulcer of left heel, stage 2: Secondary | ICD-10-CM | POA: Diagnosis not present

## 2021-03-11 DIAGNOSIS — R0781 Pleurodynia: Secondary | ICD-10-CM

## 2021-03-11 DIAGNOSIS — R079 Chest pain, unspecified: Secondary | ICD-10-CM | POA: Diagnosis not present

## 2021-03-12 DIAGNOSIS — S72142G Displaced intertrochanteric fracture of left femur, subsequent encounter for closed fracture with delayed healing: Secondary | ICD-10-CM | POA: Diagnosis not present

## 2021-03-12 DIAGNOSIS — D62 Acute posthemorrhagic anemia: Secondary | ICD-10-CM | POA: Diagnosis not present

## 2021-03-12 DIAGNOSIS — L89622 Pressure ulcer of left heel, stage 2: Secondary | ICD-10-CM | POA: Diagnosis not present

## 2021-03-12 DIAGNOSIS — M1712 Unilateral primary osteoarthritis, left knee: Secondary | ICD-10-CM | POA: Diagnosis not present

## 2021-03-12 DIAGNOSIS — L89151 Pressure ulcer of sacral region, stage 1: Secondary | ICD-10-CM | POA: Diagnosis not present

## 2021-03-12 DIAGNOSIS — E43 Unspecified severe protein-calorie malnutrition: Secondary | ICD-10-CM | POA: Diagnosis not present

## 2021-03-15 DIAGNOSIS — M1712 Unilateral primary osteoarthritis, left knee: Secondary | ICD-10-CM | POA: Diagnosis not present

## 2021-03-15 DIAGNOSIS — D62 Acute posthemorrhagic anemia: Secondary | ICD-10-CM | POA: Diagnosis not present

## 2021-03-15 DIAGNOSIS — S72142G Displaced intertrochanteric fracture of left femur, subsequent encounter for closed fracture with delayed healing: Secondary | ICD-10-CM | POA: Diagnosis not present

## 2021-03-15 DIAGNOSIS — L89622 Pressure ulcer of left heel, stage 2: Secondary | ICD-10-CM | POA: Diagnosis not present

## 2021-03-15 DIAGNOSIS — E43 Unspecified severe protein-calorie malnutrition: Secondary | ICD-10-CM | POA: Diagnosis not present

## 2021-03-15 DIAGNOSIS — L89151 Pressure ulcer of sacral region, stage 1: Secondary | ICD-10-CM | POA: Diagnosis not present

## 2021-03-16 ENCOUNTER — Ambulatory Visit (INDEPENDENT_AMBULATORY_CARE_PROVIDER_SITE_OTHER): Payer: Medicare Other | Admitting: Orthopedic Surgery

## 2021-03-16 ENCOUNTER — Ambulatory Visit: Payer: Medicare Other

## 2021-03-16 ENCOUNTER — Encounter: Payer: Self-pay | Admitting: Orthopedic Surgery

## 2021-03-16 ENCOUNTER — Other Ambulatory Visit: Payer: Self-pay

## 2021-03-16 DIAGNOSIS — S72002D Fracture of unspecified part of neck of left femur, subsequent encounter for closed fracture with routine healing: Secondary | ICD-10-CM | POA: Diagnosis not present

## 2021-03-16 DIAGNOSIS — M1712 Unilateral primary osteoarthritis, left knee: Secondary | ICD-10-CM | POA: Diagnosis not present

## 2021-03-16 DIAGNOSIS — E43 Unspecified severe protein-calorie malnutrition: Secondary | ICD-10-CM | POA: Diagnosis not present

## 2021-03-16 DIAGNOSIS — L89151 Pressure ulcer of sacral region, stage 1: Secondary | ICD-10-CM | POA: Diagnosis not present

## 2021-03-16 DIAGNOSIS — L89622 Pressure ulcer of left heel, stage 2: Secondary | ICD-10-CM | POA: Diagnosis not present

## 2021-03-16 DIAGNOSIS — D62 Acute posthemorrhagic anemia: Secondary | ICD-10-CM | POA: Diagnosis not present

## 2021-03-16 DIAGNOSIS — S72142G Displaced intertrochanteric fracture of left femur, subsequent encounter for closed fracture with delayed healing: Secondary | ICD-10-CM | POA: Diagnosis not present

## 2021-03-17 DIAGNOSIS — L89622 Pressure ulcer of left heel, stage 2: Secondary | ICD-10-CM | POA: Diagnosis not present

## 2021-03-17 DIAGNOSIS — M1712 Unilateral primary osteoarthritis, left knee: Secondary | ICD-10-CM | POA: Diagnosis not present

## 2021-03-17 DIAGNOSIS — L89151 Pressure ulcer of sacral region, stage 1: Secondary | ICD-10-CM | POA: Diagnosis not present

## 2021-03-17 DIAGNOSIS — E43 Unspecified severe protein-calorie malnutrition: Secondary | ICD-10-CM | POA: Diagnosis not present

## 2021-03-17 DIAGNOSIS — S72142G Displaced intertrochanteric fracture of left femur, subsequent encounter for closed fracture with delayed healing: Secondary | ICD-10-CM | POA: Diagnosis not present

## 2021-03-17 DIAGNOSIS — D62 Acute posthemorrhagic anemia: Secondary | ICD-10-CM | POA: Diagnosis not present

## 2021-03-17 NOTE — Progress Notes (Signed)
Orthopaedic Postop Note  Assessment: Amy Sawyer is a 87 y.o. female s/p left cephalomedullary nail for intertrochanteric femur fracture  DOS: 01/20/2021  Plan: Surgical incisions are healing well.  Radiographs demonstrates healing, without hardware failure.  Continue to work with physical therapy and occupational therapy to improve strength.  No pain in her left hip on exam today.  Continue with wound care for the ulceration on the medial aspect of the heel.  I have advised family to contact myself if there are any concerns regarding the ulcer.  Encouraged them to continue to follow-up with Dr. Willey Blade and take antibiotics as recommended by Dr. Willey Blade.  We will plan to see her back in approximately 6 weeks for repeat evaluation.  If there are any issues in the interim, that she contact the clinic.   Follow-up: Return in about 6 weeks (around 04/27/2021). XR at next visit: [facility] pelvis, left femur  Subjective:  Chief Complaint  Patient presents with   Post-op Follow-up    01/20/21 left hip surgery     History of Present Illness: Amy Sawyer is a 87 y.o. female who presents following the above stated procedure.  This is her first postoperative visit.  Following surgery, she was discharged to a rehab facility.  During that hospital stay, she did contract COVID.  She developed pneumonia, but has since recovered.  She has been discharged home, is currently under the care of home health, as well as physical therapy and Occupational Therapy.  She denies any issues with her left hip.  She does not have any pain.  She requires a lot of assistance to ambulate at this point, but is not complaining of pain.  Of greater concern to the family is an ulceration that has developed within the medial aspect of her left heel.  This is being treated with regular wound care by the home health physical therapy team.  Family members are also changing her dressings daily.  She has follow-up with Dr. Willey Blade, who  provided her with a prescription for an antibiotic due to some lower leg cellulitis.  She is in her usual state of health otherwise.  Review of Systems: No fevers or chills No numbness or tingling No Chest Pain No shortness of breath   Objective: Physical Exam:  Thin, elderly female, seated in a wheelchair.  Surgical incisions are healing well.  No surrounding erythema or drainage.  There is no tenderness palpation at the incision sites.  She does have some chronic discoloration of her lower leg.  Some swelling of the foot.  Chronic bunion deformity.  She has an ulceration over the medial aspect of the heel.  This is not tender to palpation.  There is no drainage.  It is covered by an eschar.  She does have some bruising and discoloration of the mid shin, with some small lacerations.  She is able to dorsiflex her ankle, and great toe.  Sensation is intact over the dorsum of the foot.  IMAGING: I personally ordered and reviewed the following images:  X-rays of the left hip and femur were obtained in clinic today and demonstrates excellent alignment of the intertrochanteric femur fracture.  There has been no interval subsidence or hardware failure.  Comminution of the lesser trochanter remains stable.  No acute injuries.  Impression: Healing left intertrochanteric femur fracture in excellent alignment without hardware failure.   Mordecai Rasmussen, MD 03/17/2021 8:14 AM

## 2021-03-18 ENCOUNTER — Other Ambulatory Visit: Payer: Self-pay | Admitting: Adult Health

## 2021-03-18 DIAGNOSIS — D62 Acute posthemorrhagic anemia: Secondary | ICD-10-CM | POA: Diagnosis not present

## 2021-03-18 DIAGNOSIS — L89151 Pressure ulcer of sacral region, stage 1: Secondary | ICD-10-CM | POA: Diagnosis not present

## 2021-03-18 DIAGNOSIS — S72142G Displaced intertrochanteric fracture of left femur, subsequent encounter for closed fracture with delayed healing: Secondary | ICD-10-CM | POA: Diagnosis not present

## 2021-03-18 DIAGNOSIS — E43 Unspecified severe protein-calorie malnutrition: Secondary | ICD-10-CM | POA: Diagnosis not present

## 2021-03-18 DIAGNOSIS — M1712 Unilateral primary osteoarthritis, left knee: Secondary | ICD-10-CM | POA: Diagnosis not present

## 2021-03-18 DIAGNOSIS — L89622 Pressure ulcer of left heel, stage 2: Secondary | ICD-10-CM | POA: Diagnosis not present

## 2021-03-22 DIAGNOSIS — J678 Hypersensitivity pneumonitis due to other organic dusts: Secondary | ICD-10-CM | POA: Diagnosis not present

## 2021-03-22 DIAGNOSIS — S72002A Fracture of unspecified part of neck of left femur, initial encounter for closed fracture: Secondary | ICD-10-CM | POA: Diagnosis not present

## 2021-03-22 DIAGNOSIS — Z8673 Personal history of transient ischemic attack (TIA), and cerebral infarction without residual deficits: Secondary | ICD-10-CM | POA: Diagnosis not present

## 2021-03-22 DIAGNOSIS — U071 COVID-19: Secondary | ICD-10-CM | POA: Diagnosis not present

## 2021-03-22 DIAGNOSIS — L89620 Pressure ulcer of left heel, unstageable: Secondary | ICD-10-CM | POA: Diagnosis not present

## 2021-03-23 DIAGNOSIS — M1712 Unilateral primary osteoarthritis, left knee: Secondary | ICD-10-CM | POA: Diagnosis not present

## 2021-03-23 DIAGNOSIS — L89151 Pressure ulcer of sacral region, stage 1: Secondary | ICD-10-CM | POA: Diagnosis not present

## 2021-03-23 DIAGNOSIS — L89622 Pressure ulcer of left heel, stage 2: Secondary | ICD-10-CM | POA: Diagnosis not present

## 2021-03-23 DIAGNOSIS — S72142G Displaced intertrochanteric fracture of left femur, subsequent encounter for closed fracture with delayed healing: Secondary | ICD-10-CM | POA: Diagnosis not present

## 2021-03-23 DIAGNOSIS — D62 Acute posthemorrhagic anemia: Secondary | ICD-10-CM | POA: Diagnosis not present

## 2021-03-23 DIAGNOSIS — E43 Unspecified severe protein-calorie malnutrition: Secondary | ICD-10-CM | POA: Diagnosis not present

## 2021-03-24 DIAGNOSIS — L89151 Pressure ulcer of sacral region, stage 1: Secondary | ICD-10-CM | POA: Diagnosis not present

## 2021-03-24 DIAGNOSIS — E43 Unspecified severe protein-calorie malnutrition: Secondary | ICD-10-CM | POA: Diagnosis not present

## 2021-03-24 DIAGNOSIS — D62 Acute posthemorrhagic anemia: Secondary | ICD-10-CM | POA: Diagnosis not present

## 2021-03-24 DIAGNOSIS — M1712 Unilateral primary osteoarthritis, left knee: Secondary | ICD-10-CM | POA: Diagnosis not present

## 2021-03-24 DIAGNOSIS — S72142G Displaced intertrochanteric fracture of left femur, subsequent encounter for closed fracture with delayed healing: Secondary | ICD-10-CM | POA: Diagnosis not present

## 2021-03-24 DIAGNOSIS — L89622 Pressure ulcer of left heel, stage 2: Secondary | ICD-10-CM | POA: Diagnosis not present

## 2021-03-25 DIAGNOSIS — S72142G Displaced intertrochanteric fracture of left femur, subsequent encounter for closed fracture with delayed healing: Secondary | ICD-10-CM | POA: Diagnosis not present

## 2021-03-25 DIAGNOSIS — E43 Unspecified severe protein-calorie malnutrition: Secondary | ICD-10-CM | POA: Diagnosis not present

## 2021-03-25 DIAGNOSIS — D62 Acute posthemorrhagic anemia: Secondary | ICD-10-CM | POA: Diagnosis not present

## 2021-03-25 DIAGNOSIS — M1712 Unilateral primary osteoarthritis, left knee: Secondary | ICD-10-CM | POA: Diagnosis not present

## 2021-03-25 DIAGNOSIS — L89151 Pressure ulcer of sacral region, stage 1: Secondary | ICD-10-CM | POA: Diagnosis not present

## 2021-03-25 DIAGNOSIS — L89622 Pressure ulcer of left heel, stage 2: Secondary | ICD-10-CM | POA: Diagnosis not present

## 2021-03-26 DIAGNOSIS — L89151 Pressure ulcer of sacral region, stage 1: Secondary | ICD-10-CM | POA: Diagnosis not present

## 2021-03-26 DIAGNOSIS — M1712 Unilateral primary osteoarthritis, left knee: Secondary | ICD-10-CM | POA: Diagnosis not present

## 2021-03-26 DIAGNOSIS — L89622 Pressure ulcer of left heel, stage 2: Secondary | ICD-10-CM | POA: Diagnosis not present

## 2021-03-26 DIAGNOSIS — S72142G Displaced intertrochanteric fracture of left femur, subsequent encounter for closed fracture with delayed healing: Secondary | ICD-10-CM | POA: Diagnosis not present

## 2021-03-26 DIAGNOSIS — E43 Unspecified severe protein-calorie malnutrition: Secondary | ICD-10-CM | POA: Diagnosis not present

## 2021-03-26 DIAGNOSIS — D62 Acute posthemorrhagic anemia: Secondary | ICD-10-CM | POA: Diagnosis not present

## 2021-03-27 DIAGNOSIS — W19XXXD Unspecified fall, subsequent encounter: Secondary | ICD-10-CM | POA: Diagnosis not present

## 2021-03-27 DIAGNOSIS — F339 Major depressive disorder, recurrent, unspecified: Secondary | ICD-10-CM | POA: Diagnosis not present

## 2021-03-27 DIAGNOSIS — I10 Essential (primary) hypertension: Secondary | ICD-10-CM | POA: Diagnosis not present

## 2021-03-27 DIAGNOSIS — Z7901 Long term (current) use of anticoagulants: Secondary | ICD-10-CM | POA: Diagnosis not present

## 2021-03-27 DIAGNOSIS — Z9181 History of falling: Secondary | ICD-10-CM | POA: Diagnosis not present

## 2021-03-27 DIAGNOSIS — M1712 Unilateral primary osteoarthritis, left knee: Secondary | ICD-10-CM | POA: Diagnosis not present

## 2021-03-27 DIAGNOSIS — S72142G Displaced intertrochanteric fracture of left femur, subsequent encounter for closed fracture with delayed healing: Secondary | ICD-10-CM | POA: Diagnosis not present

## 2021-03-27 DIAGNOSIS — Z8673 Personal history of transient ischemic attack (TIA), and cerebral infarction without residual deficits: Secondary | ICD-10-CM | POA: Diagnosis not present

## 2021-03-27 DIAGNOSIS — K5909 Other constipation: Secondary | ICD-10-CM | POA: Diagnosis not present

## 2021-03-27 DIAGNOSIS — L89622 Pressure ulcer of left heel, stage 2: Secondary | ICD-10-CM | POA: Diagnosis not present

## 2021-03-27 DIAGNOSIS — L89151 Pressure ulcer of sacral region, stage 1: Secondary | ICD-10-CM | POA: Diagnosis not present

## 2021-03-27 DIAGNOSIS — E43 Unspecified severe protein-calorie malnutrition: Secondary | ICD-10-CM | POA: Diagnosis not present

## 2021-03-27 DIAGNOSIS — D62 Acute posthemorrhagic anemia: Secondary | ICD-10-CM | POA: Diagnosis not present

## 2021-03-27 DIAGNOSIS — I700 Atherosclerosis of aorta: Secondary | ICD-10-CM | POA: Diagnosis not present

## 2021-03-27 DIAGNOSIS — Z993 Dependence on wheelchair: Secondary | ICD-10-CM | POA: Diagnosis not present

## 2021-03-27 DIAGNOSIS — E785 Hyperlipidemia, unspecified: Secondary | ICD-10-CM | POA: Diagnosis not present

## 2021-03-27 DIAGNOSIS — F419 Anxiety disorder, unspecified: Secondary | ICD-10-CM | POA: Diagnosis not present

## 2021-03-29 DIAGNOSIS — S72142G Displaced intertrochanteric fracture of left femur, subsequent encounter for closed fracture with delayed healing: Secondary | ICD-10-CM | POA: Diagnosis not present

## 2021-03-29 DIAGNOSIS — L89622 Pressure ulcer of left heel, stage 2: Secondary | ICD-10-CM | POA: Diagnosis not present

## 2021-03-29 DIAGNOSIS — M1712 Unilateral primary osteoarthritis, left knee: Secondary | ICD-10-CM | POA: Diagnosis not present

## 2021-03-29 DIAGNOSIS — L89151 Pressure ulcer of sacral region, stage 1: Secondary | ICD-10-CM | POA: Diagnosis not present

## 2021-03-29 DIAGNOSIS — D62 Acute posthemorrhagic anemia: Secondary | ICD-10-CM | POA: Diagnosis not present

## 2021-03-29 DIAGNOSIS — E43 Unspecified severe protein-calorie malnutrition: Secondary | ICD-10-CM | POA: Diagnosis not present

## 2021-04-01 DIAGNOSIS — L89151 Pressure ulcer of sacral region, stage 1: Secondary | ICD-10-CM | POA: Diagnosis not present

## 2021-04-01 DIAGNOSIS — L89622 Pressure ulcer of left heel, stage 2: Secondary | ICD-10-CM | POA: Diagnosis not present

## 2021-04-01 DIAGNOSIS — D62 Acute posthemorrhagic anemia: Secondary | ICD-10-CM | POA: Diagnosis not present

## 2021-04-01 DIAGNOSIS — M1712 Unilateral primary osteoarthritis, left knee: Secondary | ICD-10-CM | POA: Diagnosis not present

## 2021-04-01 DIAGNOSIS — E43 Unspecified severe protein-calorie malnutrition: Secondary | ICD-10-CM | POA: Diagnosis not present

## 2021-04-01 DIAGNOSIS — S72142G Displaced intertrochanteric fracture of left femur, subsequent encounter for closed fracture with delayed healing: Secondary | ICD-10-CM | POA: Diagnosis not present

## 2021-04-06 DIAGNOSIS — E43 Unspecified severe protein-calorie malnutrition: Secondary | ICD-10-CM | POA: Diagnosis not present

## 2021-04-06 DIAGNOSIS — L89151 Pressure ulcer of sacral region, stage 1: Secondary | ICD-10-CM | POA: Diagnosis not present

## 2021-04-06 DIAGNOSIS — D62 Acute posthemorrhagic anemia: Secondary | ICD-10-CM | POA: Diagnosis not present

## 2021-04-06 DIAGNOSIS — S72142G Displaced intertrochanteric fracture of left femur, subsequent encounter for closed fracture with delayed healing: Secondary | ICD-10-CM | POA: Diagnosis not present

## 2021-04-06 DIAGNOSIS — M1712 Unilateral primary osteoarthritis, left knee: Secondary | ICD-10-CM | POA: Diagnosis not present

## 2021-04-06 DIAGNOSIS — L89622 Pressure ulcer of left heel, stage 2: Secondary | ICD-10-CM | POA: Diagnosis not present

## 2021-04-07 DIAGNOSIS — E43 Unspecified severe protein-calorie malnutrition: Secondary | ICD-10-CM | POA: Diagnosis not present

## 2021-04-07 DIAGNOSIS — L89151 Pressure ulcer of sacral region, stage 1: Secondary | ICD-10-CM | POA: Diagnosis not present

## 2021-04-07 DIAGNOSIS — S72142G Displaced intertrochanteric fracture of left femur, subsequent encounter for closed fracture with delayed healing: Secondary | ICD-10-CM | POA: Diagnosis not present

## 2021-04-07 DIAGNOSIS — D62 Acute posthemorrhagic anemia: Secondary | ICD-10-CM | POA: Diagnosis not present

## 2021-04-07 DIAGNOSIS — L89622 Pressure ulcer of left heel, stage 2: Secondary | ICD-10-CM | POA: Diagnosis not present

## 2021-04-07 DIAGNOSIS — M1712 Unilateral primary osteoarthritis, left knee: Secondary | ICD-10-CM | POA: Diagnosis not present

## 2021-04-14 DIAGNOSIS — L89622 Pressure ulcer of left heel, stage 2: Secondary | ICD-10-CM | POA: Diagnosis not present

## 2021-04-14 DIAGNOSIS — M1712 Unilateral primary osteoarthritis, left knee: Secondary | ICD-10-CM | POA: Diagnosis not present

## 2021-04-14 DIAGNOSIS — E43 Unspecified severe protein-calorie malnutrition: Secondary | ICD-10-CM | POA: Diagnosis not present

## 2021-04-14 DIAGNOSIS — L89151 Pressure ulcer of sacral region, stage 1: Secondary | ICD-10-CM | POA: Diagnosis not present

## 2021-04-14 DIAGNOSIS — D62 Acute posthemorrhagic anemia: Secondary | ICD-10-CM | POA: Diagnosis not present

## 2021-04-14 DIAGNOSIS — S72142G Displaced intertrochanteric fracture of left femur, subsequent encounter for closed fracture with delayed healing: Secondary | ICD-10-CM | POA: Diagnosis not present

## 2021-04-16 DIAGNOSIS — S72142G Displaced intertrochanteric fracture of left femur, subsequent encounter for closed fracture with delayed healing: Secondary | ICD-10-CM | POA: Diagnosis not present

## 2021-04-16 DIAGNOSIS — M1712 Unilateral primary osteoarthritis, left knee: Secondary | ICD-10-CM | POA: Diagnosis not present

## 2021-04-16 DIAGNOSIS — L89622 Pressure ulcer of left heel, stage 2: Secondary | ICD-10-CM | POA: Diagnosis not present

## 2021-04-16 DIAGNOSIS — L89151 Pressure ulcer of sacral region, stage 1: Secondary | ICD-10-CM | POA: Diagnosis not present

## 2021-04-16 DIAGNOSIS — E43 Unspecified severe protein-calorie malnutrition: Secondary | ICD-10-CM | POA: Diagnosis not present

## 2021-04-16 DIAGNOSIS — D62 Acute posthemorrhagic anemia: Secondary | ICD-10-CM | POA: Diagnosis not present

## 2021-04-21 DIAGNOSIS — D62 Acute posthemorrhagic anemia: Secondary | ICD-10-CM | POA: Diagnosis not present

## 2021-04-21 DIAGNOSIS — L89622 Pressure ulcer of left heel, stage 2: Secondary | ICD-10-CM | POA: Diagnosis not present

## 2021-04-21 DIAGNOSIS — L89151 Pressure ulcer of sacral region, stage 1: Secondary | ICD-10-CM | POA: Diagnosis not present

## 2021-04-21 DIAGNOSIS — E43 Unspecified severe protein-calorie malnutrition: Secondary | ICD-10-CM | POA: Diagnosis not present

## 2021-04-21 DIAGNOSIS — S72142G Displaced intertrochanteric fracture of left femur, subsequent encounter for closed fracture with delayed healing: Secondary | ICD-10-CM | POA: Diagnosis not present

## 2021-04-21 DIAGNOSIS — M1712 Unilateral primary osteoarthritis, left knee: Secondary | ICD-10-CM | POA: Diagnosis not present

## 2021-04-22 DIAGNOSIS — S72142G Displaced intertrochanteric fracture of left femur, subsequent encounter for closed fracture with delayed healing: Secondary | ICD-10-CM | POA: Diagnosis not present

## 2021-04-22 DIAGNOSIS — L89622 Pressure ulcer of left heel, stage 2: Secondary | ICD-10-CM | POA: Diagnosis not present

## 2021-04-22 DIAGNOSIS — M1712 Unilateral primary osteoarthritis, left knee: Secondary | ICD-10-CM | POA: Diagnosis not present

## 2021-04-22 DIAGNOSIS — L89151 Pressure ulcer of sacral region, stage 1: Secondary | ICD-10-CM | POA: Diagnosis not present

## 2021-04-22 DIAGNOSIS — E43 Unspecified severe protein-calorie malnutrition: Secondary | ICD-10-CM | POA: Diagnosis not present

## 2021-04-22 DIAGNOSIS — L89620 Pressure ulcer of left heel, unstageable: Secondary | ICD-10-CM | POA: Diagnosis not present

## 2021-04-22 DIAGNOSIS — S72002A Fracture of unspecified part of neck of left femur, initial encounter for closed fracture: Secondary | ICD-10-CM | POA: Diagnosis not present

## 2021-04-22 DIAGNOSIS — D62 Acute posthemorrhagic anemia: Secondary | ICD-10-CM | POA: Diagnosis not present

## 2021-04-26 DIAGNOSIS — E43 Unspecified severe protein-calorie malnutrition: Secondary | ICD-10-CM | POA: Diagnosis not present

## 2021-04-26 DIAGNOSIS — E785 Hyperlipidemia, unspecified: Secondary | ICD-10-CM | POA: Diagnosis not present

## 2021-04-26 DIAGNOSIS — F419 Anxiety disorder, unspecified: Secondary | ICD-10-CM | POA: Diagnosis not present

## 2021-04-26 DIAGNOSIS — S72142G Displaced intertrochanteric fracture of left femur, subsequent encounter for closed fracture with delayed healing: Secondary | ICD-10-CM | POA: Diagnosis not present

## 2021-04-26 DIAGNOSIS — Z8673 Personal history of transient ischemic attack (TIA), and cerebral infarction without residual deficits: Secondary | ICD-10-CM | POA: Diagnosis not present

## 2021-04-26 DIAGNOSIS — K5909 Other constipation: Secondary | ICD-10-CM | POA: Diagnosis not present

## 2021-04-26 DIAGNOSIS — L89626 Pressure-induced deep tissue damage of left heel: Secondary | ICD-10-CM | POA: Diagnosis not present

## 2021-04-26 DIAGNOSIS — M1712 Unilateral primary osteoarthritis, left knee: Secondary | ICD-10-CM | POA: Diagnosis not present

## 2021-04-26 DIAGNOSIS — F339 Major depressive disorder, recurrent, unspecified: Secondary | ICD-10-CM | POA: Diagnosis not present

## 2021-04-26 DIAGNOSIS — I700 Atherosclerosis of aorta: Secondary | ICD-10-CM | POA: Diagnosis not present

## 2021-04-26 DIAGNOSIS — Z9181 History of falling: Secondary | ICD-10-CM | POA: Diagnosis not present

## 2021-04-26 DIAGNOSIS — W19XXXD Unspecified fall, subsequent encounter: Secondary | ICD-10-CM | POA: Diagnosis not present

## 2021-04-26 DIAGNOSIS — I10 Essential (primary) hypertension: Secondary | ICD-10-CM | POA: Diagnosis not present

## 2021-04-30 DIAGNOSIS — M1712 Unilateral primary osteoarthritis, left knee: Secondary | ICD-10-CM | POA: Diagnosis not present

## 2021-04-30 DIAGNOSIS — S72142G Displaced intertrochanteric fracture of left femur, subsequent encounter for closed fracture with delayed healing: Secondary | ICD-10-CM | POA: Diagnosis not present

## 2021-04-30 DIAGNOSIS — L89626 Pressure-induced deep tissue damage of left heel: Secondary | ICD-10-CM | POA: Diagnosis not present

## 2021-04-30 DIAGNOSIS — I700 Atherosclerosis of aorta: Secondary | ICD-10-CM | POA: Diagnosis not present

## 2021-04-30 DIAGNOSIS — E43 Unspecified severe protein-calorie malnutrition: Secondary | ICD-10-CM | POA: Diagnosis not present

## 2021-04-30 DIAGNOSIS — I10 Essential (primary) hypertension: Secondary | ICD-10-CM | POA: Diagnosis not present

## 2021-05-04 ENCOUNTER — Ambulatory Visit: Payer: Medicare Other | Admitting: Orthopedic Surgery

## 2021-05-06 DIAGNOSIS — M1712 Unilateral primary osteoarthritis, left knee: Secondary | ICD-10-CM | POA: Diagnosis not present

## 2021-05-06 DIAGNOSIS — I700 Atherosclerosis of aorta: Secondary | ICD-10-CM | POA: Diagnosis not present

## 2021-05-06 DIAGNOSIS — L89626 Pressure-induced deep tissue damage of left heel: Secondary | ICD-10-CM | POA: Diagnosis not present

## 2021-05-06 DIAGNOSIS — E43 Unspecified severe protein-calorie malnutrition: Secondary | ICD-10-CM | POA: Diagnosis not present

## 2021-05-06 DIAGNOSIS — S72142G Displaced intertrochanteric fracture of left femur, subsequent encounter for closed fracture with delayed healing: Secondary | ICD-10-CM | POA: Diagnosis not present

## 2021-05-06 DIAGNOSIS — I10 Essential (primary) hypertension: Secondary | ICD-10-CM | POA: Diagnosis not present

## 2021-05-07 DIAGNOSIS — M1712 Unilateral primary osteoarthritis, left knee: Secondary | ICD-10-CM | POA: Diagnosis not present

## 2021-05-07 DIAGNOSIS — I700 Atherosclerosis of aorta: Secondary | ICD-10-CM | POA: Diagnosis not present

## 2021-05-07 DIAGNOSIS — S72142G Displaced intertrochanteric fracture of left femur, subsequent encounter for closed fracture with delayed healing: Secondary | ICD-10-CM | POA: Diagnosis not present

## 2021-05-07 DIAGNOSIS — E43 Unspecified severe protein-calorie malnutrition: Secondary | ICD-10-CM | POA: Diagnosis not present

## 2021-05-07 DIAGNOSIS — L89626 Pressure-induced deep tissue damage of left heel: Secondary | ICD-10-CM | POA: Diagnosis not present

## 2021-05-07 DIAGNOSIS — I10 Essential (primary) hypertension: Secondary | ICD-10-CM | POA: Diagnosis not present

## 2021-05-13 DIAGNOSIS — S72142G Displaced intertrochanteric fracture of left femur, subsequent encounter for closed fracture with delayed healing: Secondary | ICD-10-CM | POA: Diagnosis not present

## 2021-05-13 DIAGNOSIS — I700 Atherosclerosis of aorta: Secondary | ICD-10-CM | POA: Diagnosis not present

## 2021-05-13 DIAGNOSIS — I10 Essential (primary) hypertension: Secondary | ICD-10-CM | POA: Diagnosis not present

## 2021-05-13 DIAGNOSIS — E43 Unspecified severe protein-calorie malnutrition: Secondary | ICD-10-CM | POA: Diagnosis not present

## 2021-05-13 DIAGNOSIS — L89626 Pressure-induced deep tissue damage of left heel: Secondary | ICD-10-CM | POA: Diagnosis not present

## 2021-05-13 DIAGNOSIS — M1712 Unilateral primary osteoarthritis, left knee: Secondary | ICD-10-CM | POA: Diagnosis not present

## 2021-05-14 DIAGNOSIS — L89626 Pressure-induced deep tissue damage of left heel: Secondary | ICD-10-CM | POA: Diagnosis not present

## 2021-05-14 DIAGNOSIS — E43 Unspecified severe protein-calorie malnutrition: Secondary | ICD-10-CM | POA: Diagnosis not present

## 2021-05-14 DIAGNOSIS — M1712 Unilateral primary osteoarthritis, left knee: Secondary | ICD-10-CM | POA: Diagnosis not present

## 2021-05-14 DIAGNOSIS — I10 Essential (primary) hypertension: Secondary | ICD-10-CM | POA: Diagnosis not present

## 2021-05-14 DIAGNOSIS — S72142G Displaced intertrochanteric fracture of left femur, subsequent encounter for closed fracture with delayed healing: Secondary | ICD-10-CM | POA: Diagnosis not present

## 2021-05-14 DIAGNOSIS — I700 Atherosclerosis of aorta: Secondary | ICD-10-CM | POA: Diagnosis not present

## 2021-05-17 ENCOUNTER — Ambulatory Visit: Payer: Medicare Other | Admitting: Orthopedic Surgery

## 2021-05-18 DIAGNOSIS — S72142G Displaced intertrochanteric fracture of left femur, subsequent encounter for closed fracture with delayed healing: Secondary | ICD-10-CM | POA: Diagnosis not present

## 2021-05-18 DIAGNOSIS — L89626 Pressure-induced deep tissue damage of left heel: Secondary | ICD-10-CM | POA: Diagnosis not present

## 2021-05-18 DIAGNOSIS — E43 Unspecified severe protein-calorie malnutrition: Secondary | ICD-10-CM | POA: Diagnosis not present

## 2021-05-18 DIAGNOSIS — M1712 Unilateral primary osteoarthritis, left knee: Secondary | ICD-10-CM | POA: Diagnosis not present

## 2021-05-18 DIAGNOSIS — I10 Essential (primary) hypertension: Secondary | ICD-10-CM | POA: Diagnosis not present

## 2021-05-18 DIAGNOSIS — I700 Atherosclerosis of aorta: Secondary | ICD-10-CM | POA: Diagnosis not present

## 2021-05-19 DIAGNOSIS — S72142G Displaced intertrochanteric fracture of left femur, subsequent encounter for closed fracture with delayed healing: Secondary | ICD-10-CM | POA: Diagnosis not present

## 2021-05-19 DIAGNOSIS — M1712 Unilateral primary osteoarthritis, left knee: Secondary | ICD-10-CM | POA: Diagnosis not present

## 2021-05-19 DIAGNOSIS — E43 Unspecified severe protein-calorie malnutrition: Secondary | ICD-10-CM | POA: Diagnosis not present

## 2021-05-19 DIAGNOSIS — I10 Essential (primary) hypertension: Secondary | ICD-10-CM | POA: Diagnosis not present

## 2021-05-19 DIAGNOSIS — L89626 Pressure-induced deep tissue damage of left heel: Secondary | ICD-10-CM | POA: Diagnosis not present

## 2021-05-19 DIAGNOSIS — I700 Atherosclerosis of aorta: Secondary | ICD-10-CM | POA: Diagnosis not present

## 2021-05-20 DIAGNOSIS — I10 Essential (primary) hypertension: Secondary | ICD-10-CM | POA: Diagnosis not present

## 2021-05-20 DIAGNOSIS — S72142G Displaced intertrochanteric fracture of left femur, subsequent encounter for closed fracture with delayed healing: Secondary | ICD-10-CM | POA: Diagnosis not present

## 2021-05-20 DIAGNOSIS — M1712 Unilateral primary osteoarthritis, left knee: Secondary | ICD-10-CM | POA: Diagnosis not present

## 2021-05-20 DIAGNOSIS — L89626 Pressure-induced deep tissue damage of left heel: Secondary | ICD-10-CM | POA: Diagnosis not present

## 2021-05-20 DIAGNOSIS — I700 Atherosclerosis of aorta: Secondary | ICD-10-CM | POA: Diagnosis not present

## 2021-05-20 DIAGNOSIS — E43 Unspecified severe protein-calorie malnutrition: Secondary | ICD-10-CM | POA: Diagnosis not present

## 2021-05-24 DIAGNOSIS — S72142G Displaced intertrochanteric fracture of left femur, subsequent encounter for closed fracture with delayed healing: Secondary | ICD-10-CM | POA: Diagnosis not present

## 2021-05-24 DIAGNOSIS — S92902B Unspecified fracture of left foot, initial encounter for open fracture: Secondary | ICD-10-CM | POA: Diagnosis not present

## 2021-05-24 DIAGNOSIS — M1712 Unilateral primary osteoarthritis, left knee: Secondary | ICD-10-CM | POA: Diagnosis not present

## 2021-05-24 DIAGNOSIS — S72002A Fracture of unspecified part of neck of left femur, initial encounter for closed fracture: Secondary | ICD-10-CM | POA: Diagnosis not present

## 2021-05-24 DIAGNOSIS — E43 Unspecified severe protein-calorie malnutrition: Secondary | ICD-10-CM | POA: Diagnosis not present

## 2021-05-24 DIAGNOSIS — I700 Atherosclerosis of aorta: Secondary | ICD-10-CM | POA: Diagnosis not present

## 2021-05-24 DIAGNOSIS — L89626 Pressure-induced deep tissue damage of left heel: Secondary | ICD-10-CM | POA: Diagnosis not present

## 2021-05-24 DIAGNOSIS — I10 Essential (primary) hypertension: Secondary | ICD-10-CM | POA: Diagnosis not present

## 2021-05-25 DIAGNOSIS — M1712 Unilateral primary osteoarthritis, left knee: Secondary | ICD-10-CM | POA: Diagnosis not present

## 2021-05-25 DIAGNOSIS — L89626 Pressure-induced deep tissue damage of left heel: Secondary | ICD-10-CM | POA: Diagnosis not present

## 2021-05-25 DIAGNOSIS — S72142G Displaced intertrochanteric fracture of left femur, subsequent encounter for closed fracture with delayed healing: Secondary | ICD-10-CM | POA: Diagnosis not present

## 2021-05-25 DIAGNOSIS — I10 Essential (primary) hypertension: Secondary | ICD-10-CM | POA: Diagnosis not present

## 2021-05-25 DIAGNOSIS — E43 Unspecified severe protein-calorie malnutrition: Secondary | ICD-10-CM | POA: Diagnosis not present

## 2021-05-25 DIAGNOSIS — I700 Atherosclerosis of aorta: Secondary | ICD-10-CM | POA: Diagnosis not present

## 2021-05-26 DIAGNOSIS — Z9181 History of falling: Secondary | ICD-10-CM | POA: Diagnosis not present

## 2021-05-26 DIAGNOSIS — E785 Hyperlipidemia, unspecified: Secondary | ICD-10-CM | POA: Diagnosis not present

## 2021-05-26 DIAGNOSIS — I10 Essential (primary) hypertension: Secondary | ICD-10-CM | POA: Diagnosis not present

## 2021-05-26 DIAGNOSIS — I700 Atherosclerosis of aorta: Secondary | ICD-10-CM | POA: Diagnosis not present

## 2021-05-26 DIAGNOSIS — L89626 Pressure-induced deep tissue damage of left heel: Secondary | ICD-10-CM | POA: Diagnosis not present

## 2021-05-26 DIAGNOSIS — K5909 Other constipation: Secondary | ICD-10-CM | POA: Diagnosis not present

## 2021-05-26 DIAGNOSIS — W19XXXD Unspecified fall, subsequent encounter: Secondary | ICD-10-CM | POA: Diagnosis not present

## 2021-05-26 DIAGNOSIS — E43 Unspecified severe protein-calorie malnutrition: Secondary | ICD-10-CM | POA: Diagnosis not present

## 2021-05-26 DIAGNOSIS — M1712 Unilateral primary osteoarthritis, left knee: Secondary | ICD-10-CM | POA: Diagnosis not present

## 2021-05-26 DIAGNOSIS — F419 Anxiety disorder, unspecified: Secondary | ICD-10-CM | POA: Diagnosis not present

## 2021-05-26 DIAGNOSIS — F339 Major depressive disorder, recurrent, unspecified: Secondary | ICD-10-CM | POA: Diagnosis not present

## 2021-05-26 DIAGNOSIS — S72142G Displaced intertrochanteric fracture of left femur, subsequent encounter for closed fracture with delayed healing: Secondary | ICD-10-CM | POA: Diagnosis not present

## 2021-05-26 DIAGNOSIS — Z8673 Personal history of transient ischemic attack (TIA), and cerebral infarction without residual deficits: Secondary | ICD-10-CM | POA: Diagnosis not present

## 2021-05-31 ENCOUNTER — Encounter: Payer: Self-pay | Admitting: Orthopedic Surgery

## 2021-05-31 ENCOUNTER — Ambulatory Visit: Payer: Medicare Other

## 2021-05-31 ENCOUNTER — Other Ambulatory Visit: Payer: Self-pay

## 2021-05-31 ENCOUNTER — Ambulatory Visit (INDEPENDENT_AMBULATORY_CARE_PROVIDER_SITE_OTHER): Payer: Medicare Other | Admitting: Orthopedic Surgery

## 2021-05-31 DIAGNOSIS — S72142G Displaced intertrochanteric fracture of left femur, subsequent encounter for closed fracture with delayed healing: Secondary | ICD-10-CM | POA: Diagnosis not present

## 2021-05-31 DIAGNOSIS — S72002G Fracture of unspecified part of neck of left femur, subsequent encounter for closed fracture with delayed healing: Secondary | ICD-10-CM | POA: Diagnosis not present

## 2021-05-31 DIAGNOSIS — M1712 Unilateral primary osteoarthritis, left knee: Secondary | ICD-10-CM | POA: Diagnosis not present

## 2021-05-31 DIAGNOSIS — L89626 Pressure-induced deep tissue damage of left heel: Secondary | ICD-10-CM | POA: Diagnosis not present

## 2021-05-31 DIAGNOSIS — E43 Unspecified severe protein-calorie malnutrition: Secondary | ICD-10-CM | POA: Diagnosis not present

## 2021-05-31 DIAGNOSIS — I10 Essential (primary) hypertension: Secondary | ICD-10-CM | POA: Diagnosis not present

## 2021-05-31 DIAGNOSIS — S72002D Fracture of unspecified part of neck of left femur, subsequent encounter for closed fracture with routine healing: Secondary | ICD-10-CM | POA: Diagnosis not present

## 2021-05-31 DIAGNOSIS — I700 Atherosclerosis of aorta: Secondary | ICD-10-CM | POA: Diagnosis not present

## 2021-05-31 NOTE — Progress Notes (Addendum)
Orthopaedic Postop Note  Assessment: Amy Sawyer is a 87 y.o. female s/p left cephalomedullary nail for intertrochanteric femur fracture  DOS: 01/20/2021  Plan: Hip is doing well.  Incisions are healed.  Unfortunately, she developed a left heel pressure ulcer that is slowing her down.  She is getting appropriate wound care; this should continued.  XR are stable.  She has healed the fracture.  No pain in the hip.  Continue to ambulate with an assistive device.  Patient's daughter has requested that no more follow ups are scheduled as it is difficult to get the patient to clinic, and it also causes a great deal of anxiety.  I think this is reasonable but I have asked them to contact the clinic if they have any further concerns.  Follow up as needed.    Follow-up: Return if symptoms worsen or fail to improve. XR at next visit: [facility] pelvis, left femur  Subjective:  Chief Complaint  Patient presents with   Hip Pain    DOS 01/20/21    History of Present Illness: Amy Sawyer is a 87 y.o. female who presents following the above stated procedure.  Surgery was 3 months ago.  She remains at home.  Continues to get wound care on right heel ulcer.  Denies pain in her left hip.  According to her daughter, she has never complained of pain in her hip.  She ambulates with a walker.  Her heel ulcer is restricting her ability to recover and get stronger at this point.  No fevers or chills.   Review of Systems: No fevers or chills No numbness or tingling No Chest Pain No shortness of breath   Objective: Physical Exam:  Thin, elderly female, seated in a wheelchair.  Surgical incisions are healed. No surrounding erythema or drainage.   There is no tenderness palpation at the incision sites.   chronic discoloration of her lower leg.   Ulceration over the medial aspect of the heel.  This is not tender to palpation.  There is no drainage; covered by an eschar.   She is able to dorsiflex her  ankle, and great toe.  Sensation is intact over the dorsum of the foot.  IMAGING: I personally ordered and reviewed the following images:  X-rays of the left hip and femur were obtained in clinic today and demonstrates appropriate alignment of the intertrochanteric femur fracture.  There has been no interval subsidence or hardware failure. No evidence of hardware failure or lag screw cut out.  Interval consolidation of the fracture.   Impression: Healed left intertrochanteric femur fracture in stable alignment without hardware failure.   Mordecai Rasmussen, MD 05/31/2021 11:51 PM

## 2021-06-02 DIAGNOSIS — S72142G Displaced intertrochanteric fracture of left femur, subsequent encounter for closed fracture with delayed healing: Secondary | ICD-10-CM | POA: Diagnosis not present

## 2021-06-02 DIAGNOSIS — M1712 Unilateral primary osteoarthritis, left knee: Secondary | ICD-10-CM | POA: Diagnosis not present

## 2021-06-02 DIAGNOSIS — E43 Unspecified severe protein-calorie malnutrition: Secondary | ICD-10-CM | POA: Diagnosis not present

## 2021-06-02 DIAGNOSIS — I700 Atherosclerosis of aorta: Secondary | ICD-10-CM | POA: Diagnosis not present

## 2021-06-02 DIAGNOSIS — L89626 Pressure-induced deep tissue damage of left heel: Secondary | ICD-10-CM | POA: Diagnosis not present

## 2021-06-02 DIAGNOSIS — I10 Essential (primary) hypertension: Secondary | ICD-10-CM | POA: Diagnosis not present

## 2021-06-04 DIAGNOSIS — S72142G Displaced intertrochanteric fracture of left femur, subsequent encounter for closed fracture with delayed healing: Secondary | ICD-10-CM | POA: Diagnosis not present

## 2021-06-04 DIAGNOSIS — E43 Unspecified severe protein-calorie malnutrition: Secondary | ICD-10-CM | POA: Diagnosis not present

## 2021-06-04 DIAGNOSIS — L89626 Pressure-induced deep tissue damage of left heel: Secondary | ICD-10-CM | POA: Diagnosis not present

## 2021-06-04 DIAGNOSIS — I700 Atherosclerosis of aorta: Secondary | ICD-10-CM | POA: Diagnosis not present

## 2021-06-04 DIAGNOSIS — I10 Essential (primary) hypertension: Secondary | ICD-10-CM | POA: Diagnosis not present

## 2021-06-04 DIAGNOSIS — M1712 Unilateral primary osteoarthritis, left knee: Secondary | ICD-10-CM | POA: Diagnosis not present

## 2021-06-07 DIAGNOSIS — E43 Unspecified severe protein-calorie malnutrition: Secondary | ICD-10-CM | POA: Diagnosis not present

## 2021-06-07 DIAGNOSIS — S72142G Displaced intertrochanteric fracture of left femur, subsequent encounter for closed fracture with delayed healing: Secondary | ICD-10-CM | POA: Diagnosis not present

## 2021-06-07 DIAGNOSIS — I10 Essential (primary) hypertension: Secondary | ICD-10-CM | POA: Diagnosis not present

## 2021-06-07 DIAGNOSIS — L89626 Pressure-induced deep tissue damage of left heel: Secondary | ICD-10-CM | POA: Diagnosis not present

## 2021-06-07 DIAGNOSIS — I700 Atherosclerosis of aorta: Secondary | ICD-10-CM | POA: Diagnosis not present

## 2021-06-07 DIAGNOSIS — M1712 Unilateral primary osteoarthritis, left knee: Secondary | ICD-10-CM | POA: Diagnosis not present

## 2021-06-16 DIAGNOSIS — I700 Atherosclerosis of aorta: Secondary | ICD-10-CM | POA: Diagnosis not present

## 2021-06-16 DIAGNOSIS — E43 Unspecified severe protein-calorie malnutrition: Secondary | ICD-10-CM | POA: Diagnosis not present

## 2021-06-16 DIAGNOSIS — I10 Essential (primary) hypertension: Secondary | ICD-10-CM | POA: Diagnosis not present

## 2021-06-16 DIAGNOSIS — L89626 Pressure-induced deep tissue damage of left heel: Secondary | ICD-10-CM | POA: Diagnosis not present

## 2021-06-16 DIAGNOSIS — S72142G Displaced intertrochanteric fracture of left femur, subsequent encounter for closed fracture with delayed healing: Secondary | ICD-10-CM | POA: Diagnosis not present

## 2021-06-16 DIAGNOSIS — M1712 Unilateral primary osteoarthritis, left knee: Secondary | ICD-10-CM | POA: Diagnosis not present

## 2021-06-23 ENCOUNTER — Other Ambulatory Visit (HOSPITAL_COMMUNITY)
Admission: RE | Admit: 2021-06-23 | Discharge: 2021-06-23 | Disposition: A | Discharge status: Home / Self Care | Payer: Medicare Other | Source: Other Acute Inpatient Hospital | Attending: Internal Medicine | Admitting: Internal Medicine

## 2021-06-23 DIAGNOSIS — I700 Atherosclerosis of aorta: Secondary | ICD-10-CM | POA: Diagnosis not present

## 2021-06-23 DIAGNOSIS — S72142G Displaced intertrochanteric fracture of left femur, subsequent encounter for closed fracture with delayed healing: Secondary | ICD-10-CM | POA: Diagnosis not present

## 2021-06-23 DIAGNOSIS — E440 Moderate protein-calorie malnutrition: Secondary | ICD-10-CM | POA: Diagnosis not present

## 2021-06-23 DIAGNOSIS — M1712 Unilateral primary osteoarthritis, left knee: Secondary | ICD-10-CM | POA: Diagnosis not present

## 2021-06-23 DIAGNOSIS — E43 Unspecified severe protein-calorie malnutrition: Secondary | ICD-10-CM | POA: Diagnosis not present

## 2021-06-23 DIAGNOSIS — I10 Essential (primary) hypertension: Secondary | ICD-10-CM | POA: Diagnosis not present

## 2021-06-23 DIAGNOSIS — L89620 Pressure ulcer of left heel, unstageable: Secondary | ICD-10-CM | POA: Diagnosis not present

## 2021-06-23 DIAGNOSIS — R944 Abnormal results of kidney function studies: Secondary | ICD-10-CM | POA: Diagnosis not present

## 2021-06-23 DIAGNOSIS — L89626 Pressure-induced deep tissue damage of left heel: Secondary | ICD-10-CM | POA: Diagnosis not present

## 2021-06-23 LAB — COMPREHENSIVE METABOLIC PANEL
ALT: 10 U/L (ref 0–44)
AST: 21 U/L (ref 15–41)
Albumin: 4.3 g/dL (ref 3.5–5.0)
Alkaline Phosphatase: 60 U/L (ref 38–126)
Anion gap: 9 (ref 5–15)
BUN: 8 mg/dL (ref 8–23)
CO2: 30 mmol/L (ref 22–32)
Calcium: 9.4 mg/dL (ref 8.9–10.3)
Chloride: 96 mmol/L — ABNORMAL LOW (ref 98–111)
Creatinine, Ser: 0.68 mg/dL (ref 0.44–1.00)
GFR, Estimated: >60 mL/min (ref >60)
Glucose, Bld: 94 mg/dL (ref 70–99)
Potassium: 3.9 mmol/L (ref 3.5–5.1)
Sodium: 135 mmol/L (ref 135–145)
Total Bilirubin: 0.8 mg/dL (ref 0.3–1.2)
Total Protein: 7.7 g/dL (ref 6.5–8.1)

## 2021-06-23 LAB — CBC WITH DIFFERENTIAL/PLATELET
Abs Immature Granulocytes: 0.02 10*3/uL (ref 0.00–0.07)
Basophils Absolute: 0.1 10*3/uL (ref 0.0–0.1)
Basophils Relative: 1 %
Eosinophils Absolute: 0.1 10*3/uL (ref 0.0–0.5)
Eosinophils Relative: 1 %
HCT: 45.1 % (ref 36.0–46.0)
Hemoglobin: 14.3 g/dL (ref 12.0–15.0)
Immature Granulocytes: 0 %
Lymphocytes Relative: 13 %
Lymphs Abs: 0.9 10*3/uL (ref 0.7–4.0)
MCH: 28.2 pg (ref 26.0–34.0)
MCHC: 31.7 g/dL (ref 30.0–36.0)
MCV: 89 fL (ref 80.0–100.0)
Monocytes Absolute: 0.4 10*3/uL (ref 0.1–1.0)
Monocytes Relative: 6 %
Neutro Abs: 5.4 10*3/uL (ref 1.7–7.7)
Neutrophils Relative %: 79 %
Platelets: 262 10*3/uL (ref 150–400)
RBC: 5.07 MIL/uL (ref 3.87–5.11)
RDW: 15.9 % — ABNORMAL HIGH (ref 11.5–15.5)
WBC: 6.8 10*3/uL (ref 4.0–10.5)
nRBC: 0 % (ref 0.0–0.2)

## 2021-06-25 DIAGNOSIS — E43 Unspecified severe protein-calorie malnutrition: Secondary | ICD-10-CM | POA: Diagnosis not present

## 2021-06-25 DIAGNOSIS — W19XXXD Unspecified fall, subsequent encounter: Secondary | ICD-10-CM | POA: Diagnosis not present

## 2021-06-25 DIAGNOSIS — E785 Hyperlipidemia, unspecified: Secondary | ICD-10-CM | POA: Diagnosis not present

## 2021-06-25 DIAGNOSIS — Z7982 Long term (current) use of aspirin: Secondary | ICD-10-CM | POA: Diagnosis not present

## 2021-06-25 DIAGNOSIS — I10 Essential (primary) hypertension: Secondary | ICD-10-CM | POA: Diagnosis not present

## 2021-06-25 DIAGNOSIS — M1712 Unilateral primary osteoarthritis, left knee: Secondary | ICD-10-CM | POA: Diagnosis not present

## 2021-06-25 DIAGNOSIS — L89626 Pressure-induced deep tissue damage of left heel: Secondary | ICD-10-CM | POA: Diagnosis not present

## 2021-06-25 DIAGNOSIS — K5909 Other constipation: Secondary | ICD-10-CM | POA: Diagnosis not present

## 2021-06-25 DIAGNOSIS — Z8673 Personal history of transient ischemic attack (TIA), and cerebral infarction without residual deficits: Secondary | ICD-10-CM | POA: Diagnosis not present

## 2021-06-25 DIAGNOSIS — Z7983 Long term (current) use of bisphosphonates: Secondary | ICD-10-CM | POA: Diagnosis not present

## 2021-06-25 DIAGNOSIS — F339 Major depressive disorder, recurrent, unspecified: Secondary | ICD-10-CM | POA: Diagnosis not present

## 2021-06-25 DIAGNOSIS — S72142G Displaced intertrochanteric fracture of left femur, subsequent encounter for closed fracture with delayed healing: Secondary | ICD-10-CM | POA: Diagnosis not present

## 2021-06-25 DIAGNOSIS — I700 Atherosclerosis of aorta: Secondary | ICD-10-CM | POA: Diagnosis not present

## 2021-06-25 DIAGNOSIS — Z9181 History of falling: Secondary | ICD-10-CM | POA: Diagnosis not present

## 2021-06-25 DIAGNOSIS — F419 Anxiety disorder, unspecified: Secondary | ICD-10-CM | POA: Diagnosis not present

## 2021-06-30 DIAGNOSIS — S72142G Displaced intertrochanteric fracture of left femur, subsequent encounter for closed fracture with delayed healing: Secondary | ICD-10-CM | POA: Diagnosis not present

## 2021-06-30 DIAGNOSIS — M1712 Unilateral primary osteoarthritis, left knee: Secondary | ICD-10-CM | POA: Diagnosis not present

## 2021-06-30 DIAGNOSIS — L89626 Pressure-induced deep tissue damage of left heel: Secondary | ICD-10-CM | POA: Diagnosis not present

## 2021-06-30 DIAGNOSIS — I700 Atherosclerosis of aorta: Secondary | ICD-10-CM | POA: Diagnosis not present

## 2021-06-30 DIAGNOSIS — I10 Essential (primary) hypertension: Secondary | ICD-10-CM | POA: Diagnosis not present

## 2021-06-30 DIAGNOSIS — E43 Unspecified severe protein-calorie malnutrition: Secondary | ICD-10-CM | POA: Diagnosis not present

## 2021-07-03 DIAGNOSIS — R52 Pain, unspecified: Secondary | ICD-10-CM | POA: Diagnosis not present

## 2021-07-08 DIAGNOSIS — S72142G Displaced intertrochanteric fracture of left femur, subsequent encounter for closed fracture with delayed healing: Secondary | ICD-10-CM | POA: Diagnosis not present

## 2021-07-08 DIAGNOSIS — I10 Essential (primary) hypertension: Secondary | ICD-10-CM | POA: Diagnosis not present

## 2021-07-08 DIAGNOSIS — M1712 Unilateral primary osteoarthritis, left knee: Secondary | ICD-10-CM | POA: Diagnosis not present

## 2021-07-08 DIAGNOSIS — I700 Atherosclerosis of aorta: Secondary | ICD-10-CM | POA: Diagnosis not present

## 2021-07-08 DIAGNOSIS — E43 Unspecified severe protein-calorie malnutrition: Secondary | ICD-10-CM | POA: Diagnosis not present

## 2021-07-08 DIAGNOSIS — L89626 Pressure-induced deep tissue damage of left heel: Secondary | ICD-10-CM | POA: Diagnosis not present

## 2021-07-13 DIAGNOSIS — S72142G Displaced intertrochanteric fracture of left femur, subsequent encounter for closed fracture with delayed healing: Secondary | ICD-10-CM | POA: Diagnosis not present

## 2021-07-13 DIAGNOSIS — M1712 Unilateral primary osteoarthritis, left knee: Secondary | ICD-10-CM | POA: Diagnosis not present

## 2021-07-13 DIAGNOSIS — E43 Unspecified severe protein-calorie malnutrition: Secondary | ICD-10-CM | POA: Diagnosis not present

## 2021-07-13 DIAGNOSIS — L89626 Pressure-induced deep tissue damage of left heel: Secondary | ICD-10-CM | POA: Diagnosis not present

## 2021-07-13 DIAGNOSIS — I10 Essential (primary) hypertension: Secondary | ICD-10-CM | POA: Diagnosis not present

## 2021-07-13 DIAGNOSIS — I700 Atherosclerosis of aorta: Secondary | ICD-10-CM | POA: Diagnosis not present

## 2021-07-19 DIAGNOSIS — L89626 Pressure-induced deep tissue damage of left heel: Secondary | ICD-10-CM | POA: Diagnosis not present

## 2021-07-19 DIAGNOSIS — S72142G Displaced intertrochanteric fracture of left femur, subsequent encounter for closed fracture with delayed healing: Secondary | ICD-10-CM | POA: Diagnosis not present

## 2021-07-19 DIAGNOSIS — M1712 Unilateral primary osteoarthritis, left knee: Secondary | ICD-10-CM | POA: Diagnosis not present

## 2021-07-19 DIAGNOSIS — I10 Essential (primary) hypertension: Secondary | ICD-10-CM | POA: Diagnosis not present

## 2021-07-19 DIAGNOSIS — E43 Unspecified severe protein-calorie malnutrition: Secondary | ICD-10-CM | POA: Diagnosis not present

## 2021-07-19 DIAGNOSIS — I700 Atherosclerosis of aorta: Secondary | ICD-10-CM | POA: Diagnosis not present

## 2021-07-25 DIAGNOSIS — S72142G Displaced intertrochanteric fracture of left femur, subsequent encounter for closed fracture with delayed healing: Secondary | ICD-10-CM | POA: Diagnosis not present

## 2021-07-25 DIAGNOSIS — Z7982 Long term (current) use of aspirin: Secondary | ICD-10-CM | POA: Diagnosis not present

## 2021-07-25 DIAGNOSIS — W19XXXD Unspecified fall, subsequent encounter: Secondary | ICD-10-CM | POA: Diagnosis not present

## 2021-07-25 DIAGNOSIS — Z9181 History of falling: Secondary | ICD-10-CM | POA: Diagnosis not present

## 2021-07-25 DIAGNOSIS — K5909 Other constipation: Secondary | ICD-10-CM | POA: Diagnosis not present

## 2021-07-25 DIAGNOSIS — L89626 Pressure-induced deep tissue damage of left heel: Secondary | ICD-10-CM | POA: Diagnosis not present

## 2021-07-25 DIAGNOSIS — I700 Atherosclerosis of aorta: Secondary | ICD-10-CM | POA: Diagnosis not present

## 2021-07-25 DIAGNOSIS — M1712 Unilateral primary osteoarthritis, left knee: Secondary | ICD-10-CM | POA: Diagnosis not present

## 2021-07-25 DIAGNOSIS — Z7983 Long term (current) use of bisphosphonates: Secondary | ICD-10-CM | POA: Diagnosis not present

## 2021-07-25 DIAGNOSIS — F339 Major depressive disorder, recurrent, unspecified: Secondary | ICD-10-CM | POA: Diagnosis not present

## 2021-07-25 DIAGNOSIS — F419 Anxiety disorder, unspecified: Secondary | ICD-10-CM | POA: Diagnosis not present

## 2021-07-25 DIAGNOSIS — Z8673 Personal history of transient ischemic attack (TIA), and cerebral infarction without residual deficits: Secondary | ICD-10-CM | POA: Diagnosis not present

## 2021-07-25 DIAGNOSIS — I10 Essential (primary) hypertension: Secondary | ICD-10-CM | POA: Diagnosis not present

## 2021-07-25 DIAGNOSIS — E43 Unspecified severe protein-calorie malnutrition: Secondary | ICD-10-CM | POA: Diagnosis not present

## 2021-07-25 DIAGNOSIS — E785 Hyperlipidemia, unspecified: Secondary | ICD-10-CM | POA: Diagnosis not present

## 2021-07-27 DIAGNOSIS — L89626 Pressure-induced deep tissue damage of left heel: Secondary | ICD-10-CM | POA: Diagnosis not present

## 2021-07-27 DIAGNOSIS — S72142G Displaced intertrochanteric fracture of left femur, subsequent encounter for closed fracture with delayed healing: Secondary | ICD-10-CM | POA: Diagnosis not present

## 2021-07-27 DIAGNOSIS — I700 Atherosclerosis of aorta: Secondary | ICD-10-CM | POA: Diagnosis not present

## 2021-07-27 DIAGNOSIS — M1712 Unilateral primary osteoarthritis, left knee: Secondary | ICD-10-CM | POA: Diagnosis not present

## 2021-07-27 DIAGNOSIS — I10 Essential (primary) hypertension: Secondary | ICD-10-CM | POA: Diagnosis not present

## 2021-07-27 DIAGNOSIS — E43 Unspecified severe protein-calorie malnutrition: Secondary | ICD-10-CM | POA: Diagnosis not present

## 2021-08-03 DIAGNOSIS — S72142G Displaced intertrochanteric fracture of left femur, subsequent encounter for closed fracture with delayed healing: Secondary | ICD-10-CM | POA: Diagnosis not present

## 2021-08-03 DIAGNOSIS — E43 Unspecified severe protein-calorie malnutrition: Secondary | ICD-10-CM | POA: Diagnosis not present

## 2021-08-03 DIAGNOSIS — I700 Atherosclerosis of aorta: Secondary | ICD-10-CM | POA: Diagnosis not present

## 2021-08-03 DIAGNOSIS — M1712 Unilateral primary osteoarthritis, left knee: Secondary | ICD-10-CM | POA: Diagnosis not present

## 2021-08-03 DIAGNOSIS — L89626 Pressure-induced deep tissue damage of left heel: Secondary | ICD-10-CM | POA: Diagnosis not present

## 2021-08-03 DIAGNOSIS — I10 Essential (primary) hypertension: Secondary | ICD-10-CM | POA: Diagnosis not present

## 2021-08-06 DIAGNOSIS — R197 Diarrhea, unspecified: Secondary | ICD-10-CM | POA: Diagnosis not present

## 2021-08-09 ENCOUNTER — Other Ambulatory Visit: Payer: Self-pay | Admitting: Adult Health

## 2021-08-10 DIAGNOSIS — S72142G Displaced intertrochanteric fracture of left femur, subsequent encounter for closed fracture with delayed healing: Secondary | ICD-10-CM | POA: Diagnosis not present

## 2021-08-10 DIAGNOSIS — L89626 Pressure-induced deep tissue damage of left heel: Secondary | ICD-10-CM | POA: Diagnosis not present

## 2021-08-10 DIAGNOSIS — I700 Atherosclerosis of aorta: Secondary | ICD-10-CM | POA: Diagnosis not present

## 2021-08-10 DIAGNOSIS — E43 Unspecified severe protein-calorie malnutrition: Secondary | ICD-10-CM | POA: Diagnosis not present

## 2021-08-10 DIAGNOSIS — M1712 Unilateral primary osteoarthritis, left knee: Secondary | ICD-10-CM | POA: Diagnosis not present

## 2021-08-10 DIAGNOSIS — I10 Essential (primary) hypertension: Secondary | ICD-10-CM | POA: Diagnosis not present

## 2021-08-11 DIAGNOSIS — R2681 Unsteadiness on feet: Secondary | ICD-10-CM | POA: Diagnosis not present

## 2021-08-11 DIAGNOSIS — S72142D Displaced intertrochanteric fracture of left femur, subsequent encounter for closed fracture with routine healing: Secondary | ICD-10-CM | POA: Diagnosis not present

## 2021-08-11 DIAGNOSIS — I700 Atherosclerosis of aorta: Secondary | ICD-10-CM | POA: Diagnosis not present

## 2021-08-11 DIAGNOSIS — A09 Infectious gastroenteritis and colitis, unspecified: Secondary | ICD-10-CM | POA: Diagnosis not present

## 2021-08-11 DIAGNOSIS — G311 Senile degeneration of brain, not elsewhere classified: Secondary | ICD-10-CM | POA: Diagnosis not present

## 2021-08-11 DIAGNOSIS — Z9181 History of falling: Secondary | ICD-10-CM | POA: Diagnosis not present

## 2021-08-11 DIAGNOSIS — F339 Major depressive disorder, recurrent, unspecified: Secondary | ICD-10-CM | POA: Diagnosis not present

## 2021-08-11 DIAGNOSIS — R197 Diarrhea, unspecified: Secondary | ICD-10-CM | POA: Diagnosis not present

## 2021-08-11 DIAGNOSIS — J1282 Pneumonia due to coronavirus disease 2019: Secondary | ICD-10-CM | POA: Diagnosis not present

## 2021-08-11 DIAGNOSIS — Z8673 Personal history of transient ischemic attack (TIA), and cerebral infarction without residual deficits: Secondary | ICD-10-CM | POA: Diagnosis not present

## 2021-08-11 DIAGNOSIS — D62 Acute posthemorrhagic anemia: Secondary | ICD-10-CM | POA: Diagnosis not present

## 2021-08-11 DIAGNOSIS — E46 Unspecified protein-calorie malnutrition: Secondary | ICD-10-CM | POA: Diagnosis not present

## 2021-08-11 DIAGNOSIS — E785 Hyperlipidemia, unspecified: Secondary | ICD-10-CM | POA: Diagnosis not present

## 2021-08-11 DIAGNOSIS — R262 Difficulty in walking, not elsewhere classified: Secondary | ICD-10-CM | POA: Diagnosis not present

## 2021-08-11 DIAGNOSIS — M6281 Muscle weakness (generalized): Secondary | ICD-10-CM | POA: Diagnosis not present

## 2021-08-13 DIAGNOSIS — S72142D Displaced intertrochanteric fracture of left femur, subsequent encounter for closed fracture with routine healing: Secondary | ICD-10-CM | POA: Diagnosis not present

## 2021-08-13 DIAGNOSIS — Z8673 Personal history of transient ischemic attack (TIA), and cerebral infarction without residual deficits: Secondary | ICD-10-CM | POA: Diagnosis not present

## 2021-08-13 DIAGNOSIS — G311 Senile degeneration of brain, not elsewhere classified: Secondary | ICD-10-CM | POA: Diagnosis not present

## 2021-08-13 DIAGNOSIS — D62 Acute posthemorrhagic anemia: Secondary | ICD-10-CM | POA: Diagnosis not present

## 2021-08-13 DIAGNOSIS — J1282 Pneumonia due to coronavirus disease 2019: Secondary | ICD-10-CM | POA: Diagnosis not present

## 2021-08-13 DIAGNOSIS — E46 Unspecified protein-calorie malnutrition: Secondary | ICD-10-CM | POA: Diagnosis not present

## 2021-08-16 DIAGNOSIS — S72142D Displaced intertrochanteric fracture of left femur, subsequent encounter for closed fracture with routine healing: Secondary | ICD-10-CM | POA: Diagnosis not present

## 2021-08-16 DIAGNOSIS — G311 Senile degeneration of brain, not elsewhere classified: Secondary | ICD-10-CM | POA: Diagnosis not present

## 2021-08-16 DIAGNOSIS — D62 Acute posthemorrhagic anemia: Secondary | ICD-10-CM | POA: Diagnosis not present

## 2021-08-16 DIAGNOSIS — E46 Unspecified protein-calorie malnutrition: Secondary | ICD-10-CM | POA: Diagnosis not present

## 2021-08-16 DIAGNOSIS — Z8673 Personal history of transient ischemic attack (TIA), and cerebral infarction without residual deficits: Secondary | ICD-10-CM | POA: Diagnosis not present

## 2021-08-16 DIAGNOSIS — J1282 Pneumonia due to coronavirus disease 2019: Secondary | ICD-10-CM | POA: Diagnosis not present

## 2021-08-19 DIAGNOSIS — E46 Unspecified protein-calorie malnutrition: Secondary | ICD-10-CM | POA: Diagnosis not present

## 2021-08-19 DIAGNOSIS — S72142D Displaced intertrochanteric fracture of left femur, subsequent encounter for closed fracture with routine healing: Secondary | ICD-10-CM | POA: Diagnosis not present

## 2021-08-19 DIAGNOSIS — D62 Acute posthemorrhagic anemia: Secondary | ICD-10-CM | POA: Diagnosis not present

## 2021-08-19 DIAGNOSIS — J1282 Pneumonia due to coronavirus disease 2019: Secondary | ICD-10-CM | POA: Diagnosis not present

## 2021-08-19 DIAGNOSIS — Z8673 Personal history of transient ischemic attack (TIA), and cerebral infarction without residual deficits: Secondary | ICD-10-CM | POA: Diagnosis not present

## 2021-08-19 DIAGNOSIS — G311 Senile degeneration of brain, not elsewhere classified: Secondary | ICD-10-CM | POA: Diagnosis not present

## 2021-08-24 DIAGNOSIS — Z8673 Personal history of transient ischemic attack (TIA), and cerebral infarction without residual deficits: Secondary | ICD-10-CM | POA: Diagnosis not present

## 2021-08-24 DIAGNOSIS — G311 Senile degeneration of brain, not elsewhere classified: Secondary | ICD-10-CM | POA: Diagnosis not present

## 2021-08-24 DIAGNOSIS — J1282 Pneumonia due to coronavirus disease 2019: Secondary | ICD-10-CM | POA: Diagnosis not present

## 2021-08-24 DIAGNOSIS — S72142D Displaced intertrochanteric fracture of left femur, subsequent encounter for closed fracture with routine healing: Secondary | ICD-10-CM | POA: Diagnosis not present

## 2021-08-24 DIAGNOSIS — D62 Acute posthemorrhagic anemia: Secondary | ICD-10-CM | POA: Diagnosis not present

## 2021-08-24 DIAGNOSIS — E46 Unspecified protein-calorie malnutrition: Secondary | ICD-10-CM | POA: Diagnosis not present

## 2021-08-26 DIAGNOSIS — E46 Unspecified protein-calorie malnutrition: Secondary | ICD-10-CM | POA: Diagnosis not present

## 2021-08-26 DIAGNOSIS — S72142D Displaced intertrochanteric fracture of left femur, subsequent encounter for closed fracture with routine healing: Secondary | ICD-10-CM | POA: Diagnosis not present

## 2021-08-26 DIAGNOSIS — Z8673 Personal history of transient ischemic attack (TIA), and cerebral infarction without residual deficits: Secondary | ICD-10-CM | POA: Diagnosis not present

## 2021-08-26 DIAGNOSIS — G311 Senile degeneration of brain, not elsewhere classified: Secondary | ICD-10-CM | POA: Diagnosis not present

## 2021-08-26 DIAGNOSIS — D62 Acute posthemorrhagic anemia: Secondary | ICD-10-CM | POA: Diagnosis not present

## 2021-08-26 DIAGNOSIS — J1282 Pneumonia due to coronavirus disease 2019: Secondary | ICD-10-CM | POA: Diagnosis not present

## 2021-08-31 DIAGNOSIS — E46 Unspecified protein-calorie malnutrition: Secondary | ICD-10-CM | POA: Diagnosis not present

## 2021-08-31 DIAGNOSIS — G311 Senile degeneration of brain, not elsewhere classified: Secondary | ICD-10-CM | POA: Diagnosis not present

## 2021-08-31 DIAGNOSIS — D62 Acute posthemorrhagic anemia: Secondary | ICD-10-CM | POA: Diagnosis not present

## 2021-08-31 DIAGNOSIS — S72142D Displaced intertrochanteric fracture of left femur, subsequent encounter for closed fracture with routine healing: Secondary | ICD-10-CM | POA: Diagnosis not present

## 2021-08-31 DIAGNOSIS — Z8673 Personal history of transient ischemic attack (TIA), and cerebral infarction without residual deficits: Secondary | ICD-10-CM | POA: Diagnosis not present

## 2021-08-31 DIAGNOSIS — J1282 Pneumonia due to coronavirus disease 2019: Secondary | ICD-10-CM | POA: Diagnosis not present

## 2021-09-03 DIAGNOSIS — J1282 Pneumonia due to coronavirus disease 2019: Secondary | ICD-10-CM | POA: Diagnosis not present

## 2021-09-03 DIAGNOSIS — Z8673 Personal history of transient ischemic attack (TIA), and cerebral infarction without residual deficits: Secondary | ICD-10-CM | POA: Diagnosis not present

## 2021-09-03 DIAGNOSIS — D62 Acute posthemorrhagic anemia: Secondary | ICD-10-CM | POA: Diagnosis not present

## 2021-09-03 DIAGNOSIS — S72142D Displaced intertrochanteric fracture of left femur, subsequent encounter for closed fracture with routine healing: Secondary | ICD-10-CM | POA: Diagnosis not present

## 2021-09-03 DIAGNOSIS — G311 Senile degeneration of brain, not elsewhere classified: Secondary | ICD-10-CM | POA: Diagnosis not present

## 2021-09-03 DIAGNOSIS — E46 Unspecified protein-calorie malnutrition: Secondary | ICD-10-CM | POA: Diagnosis not present

## 2021-09-05 DIAGNOSIS — R262 Difficulty in walking, not elsewhere classified: Secondary | ICD-10-CM | POA: Diagnosis not present

## 2021-09-05 DIAGNOSIS — M6281 Muscle weakness (generalized): Secondary | ICD-10-CM | POA: Diagnosis not present

## 2021-09-05 DIAGNOSIS — D62 Acute posthemorrhagic anemia: Secondary | ICD-10-CM | POA: Diagnosis not present

## 2021-09-05 DIAGNOSIS — Z8673 Personal history of transient ischemic attack (TIA), and cerebral infarction without residual deficits: Secondary | ICD-10-CM | POA: Diagnosis not present

## 2021-09-05 DIAGNOSIS — E785 Hyperlipidemia, unspecified: Secondary | ICD-10-CM | POA: Diagnosis not present

## 2021-09-05 DIAGNOSIS — R2681 Unsteadiness on feet: Secondary | ICD-10-CM | POA: Diagnosis not present

## 2021-09-05 DIAGNOSIS — E46 Unspecified protein-calorie malnutrition: Secondary | ICD-10-CM | POA: Diagnosis not present

## 2021-09-05 DIAGNOSIS — S72142D Displaced intertrochanteric fracture of left femur, subsequent encounter for closed fracture with routine healing: Secondary | ICD-10-CM | POA: Diagnosis not present

## 2021-09-05 DIAGNOSIS — I700 Atherosclerosis of aorta: Secondary | ICD-10-CM | POA: Diagnosis not present

## 2021-09-05 DIAGNOSIS — G311 Senile degeneration of brain, not elsewhere classified: Secondary | ICD-10-CM | POA: Diagnosis not present

## 2021-09-05 DIAGNOSIS — F339 Major depressive disorder, recurrent, unspecified: Secondary | ICD-10-CM | POA: Diagnosis not present

## 2021-09-05 DIAGNOSIS — J1282 Pneumonia due to coronavirus disease 2019: Secondary | ICD-10-CM | POA: Diagnosis not present

## 2021-09-05 DIAGNOSIS — Z9181 History of falling: Secondary | ICD-10-CM | POA: Diagnosis not present

## 2021-09-08 DIAGNOSIS — J1282 Pneumonia due to coronavirus disease 2019: Secondary | ICD-10-CM | POA: Diagnosis not present

## 2021-09-08 DIAGNOSIS — S72142D Displaced intertrochanteric fracture of left femur, subsequent encounter for closed fracture with routine healing: Secondary | ICD-10-CM | POA: Diagnosis not present

## 2021-09-08 DIAGNOSIS — G311 Senile degeneration of brain, not elsewhere classified: Secondary | ICD-10-CM | POA: Diagnosis not present

## 2021-09-08 DIAGNOSIS — Z8673 Personal history of transient ischemic attack (TIA), and cerebral infarction without residual deficits: Secondary | ICD-10-CM | POA: Diagnosis not present

## 2021-09-08 DIAGNOSIS — D62 Acute posthemorrhagic anemia: Secondary | ICD-10-CM | POA: Diagnosis not present

## 2021-09-08 DIAGNOSIS — E46 Unspecified protein-calorie malnutrition: Secondary | ICD-10-CM | POA: Diagnosis not present

## 2021-09-09 DIAGNOSIS — S72142D Displaced intertrochanteric fracture of left femur, subsequent encounter for closed fracture with routine healing: Secondary | ICD-10-CM | POA: Diagnosis not present

## 2021-09-09 DIAGNOSIS — Z8673 Personal history of transient ischemic attack (TIA), and cerebral infarction without residual deficits: Secondary | ICD-10-CM | POA: Diagnosis not present

## 2021-09-09 DIAGNOSIS — E46 Unspecified protein-calorie malnutrition: Secondary | ICD-10-CM | POA: Diagnosis not present

## 2021-09-09 DIAGNOSIS — J1282 Pneumonia due to coronavirus disease 2019: Secondary | ICD-10-CM | POA: Diagnosis not present

## 2021-09-09 DIAGNOSIS — D62 Acute posthemorrhagic anemia: Secondary | ICD-10-CM | POA: Diagnosis not present

## 2021-09-09 DIAGNOSIS — G311 Senile degeneration of brain, not elsewhere classified: Secondary | ICD-10-CM | POA: Diagnosis not present

## 2021-09-13 DIAGNOSIS — J1282 Pneumonia due to coronavirus disease 2019: Secondary | ICD-10-CM | POA: Diagnosis not present

## 2021-09-13 DIAGNOSIS — E46 Unspecified protein-calorie malnutrition: Secondary | ICD-10-CM | POA: Diagnosis not present

## 2021-09-13 DIAGNOSIS — D62 Acute posthemorrhagic anemia: Secondary | ICD-10-CM | POA: Diagnosis not present

## 2021-09-13 DIAGNOSIS — G311 Senile degeneration of brain, not elsewhere classified: Secondary | ICD-10-CM | POA: Diagnosis not present

## 2021-09-13 DIAGNOSIS — Z8673 Personal history of transient ischemic attack (TIA), and cerebral infarction without residual deficits: Secondary | ICD-10-CM | POA: Diagnosis not present

## 2021-09-13 DIAGNOSIS — S72142D Displaced intertrochanteric fracture of left femur, subsequent encounter for closed fracture with routine healing: Secondary | ICD-10-CM | POA: Diagnosis not present

## 2021-09-14 DIAGNOSIS — D62 Acute posthemorrhagic anemia: Secondary | ICD-10-CM | POA: Diagnosis not present

## 2021-09-14 DIAGNOSIS — E46 Unspecified protein-calorie malnutrition: Secondary | ICD-10-CM | POA: Diagnosis not present

## 2021-09-14 DIAGNOSIS — J1282 Pneumonia due to coronavirus disease 2019: Secondary | ICD-10-CM | POA: Diagnosis not present

## 2021-09-14 DIAGNOSIS — G311 Senile degeneration of brain, not elsewhere classified: Secondary | ICD-10-CM | POA: Diagnosis not present

## 2021-09-14 DIAGNOSIS — S72142D Displaced intertrochanteric fracture of left femur, subsequent encounter for closed fracture with routine healing: Secondary | ICD-10-CM | POA: Diagnosis not present

## 2021-09-14 DIAGNOSIS — Z8673 Personal history of transient ischemic attack (TIA), and cerebral infarction without residual deficits: Secondary | ICD-10-CM | POA: Diagnosis not present

## 2021-09-16 DIAGNOSIS — Z8673 Personal history of transient ischemic attack (TIA), and cerebral infarction without residual deficits: Secondary | ICD-10-CM | POA: Diagnosis not present

## 2021-09-16 DIAGNOSIS — G311 Senile degeneration of brain, not elsewhere classified: Secondary | ICD-10-CM | POA: Diagnosis not present

## 2021-09-16 DIAGNOSIS — S72142D Displaced intertrochanteric fracture of left femur, subsequent encounter for closed fracture with routine healing: Secondary | ICD-10-CM | POA: Diagnosis not present

## 2021-09-16 DIAGNOSIS — J1282 Pneumonia due to coronavirus disease 2019: Secondary | ICD-10-CM | POA: Diagnosis not present

## 2021-09-16 DIAGNOSIS — E46 Unspecified protein-calorie malnutrition: Secondary | ICD-10-CM | POA: Diagnosis not present

## 2021-09-16 DIAGNOSIS — D62 Acute posthemorrhagic anemia: Secondary | ICD-10-CM | POA: Diagnosis not present

## 2021-09-17 DIAGNOSIS — S72142D Displaced intertrochanteric fracture of left femur, subsequent encounter for closed fracture with routine healing: Secondary | ICD-10-CM | POA: Diagnosis not present

## 2021-09-17 DIAGNOSIS — E46 Unspecified protein-calorie malnutrition: Secondary | ICD-10-CM | POA: Diagnosis not present

## 2021-09-17 DIAGNOSIS — D62 Acute posthemorrhagic anemia: Secondary | ICD-10-CM | POA: Diagnosis not present

## 2021-09-17 DIAGNOSIS — G311 Senile degeneration of brain, not elsewhere classified: Secondary | ICD-10-CM | POA: Diagnosis not present

## 2021-09-17 DIAGNOSIS — Z8673 Personal history of transient ischemic attack (TIA), and cerebral infarction without residual deficits: Secondary | ICD-10-CM | POA: Diagnosis not present

## 2021-09-17 DIAGNOSIS — J1282 Pneumonia due to coronavirus disease 2019: Secondary | ICD-10-CM | POA: Diagnosis not present

## 2021-09-23 DIAGNOSIS — Z8673 Personal history of transient ischemic attack (TIA), and cerebral infarction without residual deficits: Secondary | ICD-10-CM | POA: Diagnosis not present

## 2021-09-23 DIAGNOSIS — S72142D Displaced intertrochanteric fracture of left femur, subsequent encounter for closed fracture with routine healing: Secondary | ICD-10-CM | POA: Diagnosis not present

## 2021-09-23 DIAGNOSIS — D62 Acute posthemorrhagic anemia: Secondary | ICD-10-CM | POA: Diagnosis not present

## 2021-09-23 DIAGNOSIS — J1282 Pneumonia due to coronavirus disease 2019: Secondary | ICD-10-CM | POA: Diagnosis not present

## 2021-09-23 DIAGNOSIS — E46 Unspecified protein-calorie malnutrition: Secondary | ICD-10-CM | POA: Diagnosis not present

## 2021-09-23 DIAGNOSIS — G311 Senile degeneration of brain, not elsewhere classified: Secondary | ICD-10-CM | POA: Diagnosis not present

## 2021-09-27 DIAGNOSIS — S72142D Displaced intertrochanteric fracture of left femur, subsequent encounter for closed fracture with routine healing: Secondary | ICD-10-CM | POA: Diagnosis not present

## 2021-09-27 DIAGNOSIS — G311 Senile degeneration of brain, not elsewhere classified: Secondary | ICD-10-CM | POA: Diagnosis not present

## 2021-09-27 DIAGNOSIS — J1282 Pneumonia due to coronavirus disease 2019: Secondary | ICD-10-CM | POA: Diagnosis not present

## 2021-09-27 DIAGNOSIS — D62 Acute posthemorrhagic anemia: Secondary | ICD-10-CM | POA: Diagnosis not present

## 2021-09-27 DIAGNOSIS — E46 Unspecified protein-calorie malnutrition: Secondary | ICD-10-CM | POA: Diagnosis not present

## 2021-09-27 DIAGNOSIS — Z8673 Personal history of transient ischemic attack (TIA), and cerebral infarction without residual deficits: Secondary | ICD-10-CM | POA: Diagnosis not present

## 2021-09-30 DIAGNOSIS — J1282 Pneumonia due to coronavirus disease 2019: Secondary | ICD-10-CM | POA: Diagnosis not present

## 2021-09-30 DIAGNOSIS — E46 Unspecified protein-calorie malnutrition: Secondary | ICD-10-CM | POA: Diagnosis not present

## 2021-09-30 DIAGNOSIS — Z8673 Personal history of transient ischemic attack (TIA), and cerebral infarction without residual deficits: Secondary | ICD-10-CM | POA: Diagnosis not present

## 2021-09-30 DIAGNOSIS — S72142D Displaced intertrochanteric fracture of left femur, subsequent encounter for closed fracture with routine healing: Secondary | ICD-10-CM | POA: Diagnosis not present

## 2021-09-30 DIAGNOSIS — G311 Senile degeneration of brain, not elsewhere classified: Secondary | ICD-10-CM | POA: Diagnosis not present

## 2021-09-30 DIAGNOSIS — D62 Acute posthemorrhagic anemia: Secondary | ICD-10-CM | POA: Diagnosis not present

## 2021-10-01 DIAGNOSIS — G311 Senile degeneration of brain, not elsewhere classified: Secondary | ICD-10-CM | POA: Diagnosis not present

## 2021-10-01 DIAGNOSIS — D62 Acute posthemorrhagic anemia: Secondary | ICD-10-CM | POA: Diagnosis not present

## 2021-10-01 DIAGNOSIS — Z8673 Personal history of transient ischemic attack (TIA), and cerebral infarction without residual deficits: Secondary | ICD-10-CM | POA: Diagnosis not present

## 2021-10-01 DIAGNOSIS — J1282 Pneumonia due to coronavirus disease 2019: Secondary | ICD-10-CM | POA: Diagnosis not present

## 2021-10-01 DIAGNOSIS — E46 Unspecified protein-calorie malnutrition: Secondary | ICD-10-CM | POA: Diagnosis not present

## 2021-10-01 DIAGNOSIS — S72142D Displaced intertrochanteric fracture of left femur, subsequent encounter for closed fracture with routine healing: Secondary | ICD-10-CM | POA: Diagnosis not present

## 2021-10-06 DIAGNOSIS — S72142D Displaced intertrochanteric fracture of left femur, subsequent encounter for closed fracture with routine healing: Secondary | ICD-10-CM | POA: Diagnosis not present

## 2021-10-06 DIAGNOSIS — Z8673 Personal history of transient ischemic attack (TIA), and cerebral infarction without residual deficits: Secondary | ICD-10-CM | POA: Diagnosis not present

## 2021-10-06 DIAGNOSIS — E785 Hyperlipidemia, unspecified: Secondary | ICD-10-CM | POA: Diagnosis not present

## 2021-10-06 DIAGNOSIS — F339 Major depressive disorder, recurrent, unspecified: Secondary | ICD-10-CM | POA: Diagnosis not present

## 2021-10-06 DIAGNOSIS — M6281 Muscle weakness (generalized): Secondary | ICD-10-CM | POA: Diagnosis not present

## 2021-10-06 DIAGNOSIS — R2681 Unsteadiness on feet: Secondary | ICD-10-CM | POA: Diagnosis not present

## 2021-10-06 DIAGNOSIS — Z9181 History of falling: Secondary | ICD-10-CM | POA: Diagnosis not present

## 2021-10-06 DIAGNOSIS — I700 Atherosclerosis of aorta: Secondary | ICD-10-CM | POA: Diagnosis not present

## 2021-10-06 DIAGNOSIS — E46 Unspecified protein-calorie malnutrition: Secondary | ICD-10-CM | POA: Diagnosis not present

## 2021-10-06 DIAGNOSIS — G311 Senile degeneration of brain, not elsewhere classified: Secondary | ICD-10-CM | POA: Diagnosis not present

## 2021-10-06 DIAGNOSIS — D62 Acute posthemorrhagic anemia: Secondary | ICD-10-CM | POA: Diagnosis not present

## 2021-10-06 DIAGNOSIS — R262 Difficulty in walking, not elsewhere classified: Secondary | ICD-10-CM | POA: Diagnosis not present

## 2021-10-06 DIAGNOSIS — J1282 Pneumonia due to coronavirus disease 2019: Secondary | ICD-10-CM | POA: Diagnosis not present

## 2021-10-07 DIAGNOSIS — D62 Acute posthemorrhagic anemia: Secondary | ICD-10-CM | POA: Diagnosis not present

## 2021-10-07 DIAGNOSIS — Z8673 Personal history of transient ischemic attack (TIA), and cerebral infarction without residual deficits: Secondary | ICD-10-CM | POA: Diagnosis not present

## 2021-10-07 DIAGNOSIS — J1282 Pneumonia due to coronavirus disease 2019: Secondary | ICD-10-CM | POA: Diagnosis not present

## 2021-10-07 DIAGNOSIS — G311 Senile degeneration of brain, not elsewhere classified: Secondary | ICD-10-CM | POA: Diagnosis not present

## 2021-10-07 DIAGNOSIS — E46 Unspecified protein-calorie malnutrition: Secondary | ICD-10-CM | POA: Diagnosis not present

## 2021-10-07 DIAGNOSIS — S72142D Displaced intertrochanteric fracture of left femur, subsequent encounter for closed fracture with routine healing: Secondary | ICD-10-CM | POA: Diagnosis not present

## 2021-10-11 DIAGNOSIS — D62 Acute posthemorrhagic anemia: Secondary | ICD-10-CM | POA: Diagnosis not present

## 2021-10-11 DIAGNOSIS — Z8673 Personal history of transient ischemic attack (TIA), and cerebral infarction without residual deficits: Secondary | ICD-10-CM | POA: Diagnosis not present

## 2021-10-11 DIAGNOSIS — G311 Senile degeneration of brain, not elsewhere classified: Secondary | ICD-10-CM | POA: Diagnosis not present

## 2021-10-11 DIAGNOSIS — J1282 Pneumonia due to coronavirus disease 2019: Secondary | ICD-10-CM | POA: Diagnosis not present

## 2021-10-11 DIAGNOSIS — S72142D Displaced intertrochanteric fracture of left femur, subsequent encounter for closed fracture with routine healing: Secondary | ICD-10-CM | POA: Diagnosis not present

## 2021-10-11 DIAGNOSIS — E46 Unspecified protein-calorie malnutrition: Secondary | ICD-10-CM | POA: Diagnosis not present

## 2021-10-14 DIAGNOSIS — Z8673 Personal history of transient ischemic attack (TIA), and cerebral infarction without residual deficits: Secondary | ICD-10-CM | POA: Diagnosis not present

## 2021-10-14 DIAGNOSIS — E46 Unspecified protein-calorie malnutrition: Secondary | ICD-10-CM | POA: Diagnosis not present

## 2021-10-14 DIAGNOSIS — S72142D Displaced intertrochanteric fracture of left femur, subsequent encounter for closed fracture with routine healing: Secondary | ICD-10-CM | POA: Diagnosis not present

## 2021-10-14 DIAGNOSIS — G311 Senile degeneration of brain, not elsewhere classified: Secondary | ICD-10-CM | POA: Diagnosis not present

## 2021-10-14 DIAGNOSIS — J1282 Pneumonia due to coronavirus disease 2019: Secondary | ICD-10-CM | POA: Diagnosis not present

## 2021-10-14 DIAGNOSIS — D62 Acute posthemorrhagic anemia: Secondary | ICD-10-CM | POA: Diagnosis not present

## 2021-10-15 DIAGNOSIS — J1282 Pneumonia due to coronavirus disease 2019: Secondary | ICD-10-CM | POA: Diagnosis not present

## 2021-10-15 DIAGNOSIS — S72142D Displaced intertrochanteric fracture of left femur, subsequent encounter for closed fracture with routine healing: Secondary | ICD-10-CM | POA: Diagnosis not present

## 2021-10-15 DIAGNOSIS — G311 Senile degeneration of brain, not elsewhere classified: Secondary | ICD-10-CM | POA: Diagnosis not present

## 2021-10-15 DIAGNOSIS — Z8673 Personal history of transient ischemic attack (TIA), and cerebral infarction without residual deficits: Secondary | ICD-10-CM | POA: Diagnosis not present

## 2021-10-15 DIAGNOSIS — E46 Unspecified protein-calorie malnutrition: Secondary | ICD-10-CM | POA: Diagnosis not present

## 2021-10-15 DIAGNOSIS — D62 Acute posthemorrhagic anemia: Secondary | ICD-10-CM | POA: Diagnosis not present

## 2021-10-20 ENCOUNTER — Telehealth: Payer: Self-pay | Admitting: Family Medicine

## 2021-10-20 NOTE — Telephone Encounter (Signed)
Spoke to patients dtr and she states that the pt is in a lot of pain and wanted to know if she could be seen by Dr. Arnoldo Morale today. Unfortunately he is not in office today. I spoke to dtr and she states that she is in a lot of pain and wanted to know what to do for her. I recommended that if she is in that much pain she may need to go to the ER. She is using Tylenol but only a few times a day and nothing regular. Dtr did state that her dad had passed away and she did not want to take her to the ER but is she gets worse she will take her tomorrow night. Informed her to rotate tylenol and ibuprofen q 4-6 hours but to keep is going in her system to control her pain. Daughter verbalized understanding.

## 2021-10-21 DIAGNOSIS — Z8673 Personal history of transient ischemic attack (TIA), and cerebral infarction without residual deficits: Secondary | ICD-10-CM | POA: Diagnosis not present

## 2021-10-21 DIAGNOSIS — S72142D Displaced intertrochanteric fracture of left femur, subsequent encounter for closed fracture with routine healing: Secondary | ICD-10-CM | POA: Diagnosis not present

## 2021-10-21 DIAGNOSIS — J1282 Pneumonia due to coronavirus disease 2019: Secondary | ICD-10-CM | POA: Diagnosis not present

## 2021-10-21 DIAGNOSIS — E46 Unspecified protein-calorie malnutrition: Secondary | ICD-10-CM | POA: Diagnosis not present

## 2021-10-21 DIAGNOSIS — D62 Acute posthemorrhagic anemia: Secondary | ICD-10-CM | POA: Diagnosis not present

## 2021-10-21 DIAGNOSIS — G311 Senile degeneration of brain, not elsewhere classified: Secondary | ICD-10-CM | POA: Diagnosis not present

## 2021-10-24 DIAGNOSIS — G311 Senile degeneration of brain, not elsewhere classified: Secondary | ICD-10-CM | POA: Diagnosis not present

## 2021-10-24 DIAGNOSIS — Z8673 Personal history of transient ischemic attack (TIA), and cerebral infarction without residual deficits: Secondary | ICD-10-CM | POA: Diagnosis not present

## 2021-10-24 DIAGNOSIS — E46 Unspecified protein-calorie malnutrition: Secondary | ICD-10-CM | POA: Diagnosis not present

## 2021-10-24 DIAGNOSIS — S72142D Displaced intertrochanteric fracture of left femur, subsequent encounter for closed fracture with routine healing: Secondary | ICD-10-CM | POA: Diagnosis not present

## 2021-10-24 DIAGNOSIS — D62 Acute posthemorrhagic anemia: Secondary | ICD-10-CM | POA: Diagnosis not present

## 2021-10-24 DIAGNOSIS — J1282 Pneumonia due to coronavirus disease 2019: Secondary | ICD-10-CM | POA: Diagnosis not present

## 2021-10-26 ENCOUNTER — Ambulatory Visit (INDEPENDENT_AMBULATORY_CARE_PROVIDER_SITE_OTHER): Payer: Medicare Other | Admitting: General Surgery

## 2021-10-26 ENCOUNTER — Encounter: Payer: Self-pay | Admitting: General Surgery

## 2021-10-26 ENCOUNTER — Other Ambulatory Visit: Payer: Self-pay

## 2021-10-26 VITALS — BP 173/114 | HR 83 | Temp 98.3°F | Resp 12 | Ht 61.0 in | Wt 110.0 lb

## 2021-10-26 DIAGNOSIS — K4090 Unilateral inguinal hernia, without obstruction or gangrene, not specified as recurrent: Secondary | ICD-10-CM | POA: Diagnosis not present

## 2021-10-27 NOTE — Progress Notes (Signed)
Amy Sawyer; [id number]; 10-Jan-1931   HPI Patient is an 88 year old white female who was referred to my care by Asencion Noble for evaluation and treatment of a right inguinal hernia.  Patient's caregiver states that she had she has intermittent discomfort in the right groin region.  She does have a swelling there but it does reduce when she lies down.  She is hard of hearing and significantly demented.  She is also on hospice at this time.  She does not ambulate much and is in a wheelchair.  History was obtained from the patient's family member who is her caregiver.  The hernia has never been incarcerated. Past Medical History:  Diagnosis Date   HOH (hard of hearing)    HTN (hypertension)    Hyperlipemia    Proximal humerus fracture 03/13/2012   doi 03-13-2012 left     Stroke [facility])     Past Surgical History:  Procedure Laterality Date   BREAST SURGERY     INTRAMEDULLARY (IM) NAIL INTERTROCHANTERIC Left 01/20/2021   Procedure: OPERATIVE FIXATION OF LEFT HIP FRACTURE;  Surgeon: Mordecai Rasmussen, MD;  Location: [facility];  Service: Orthopedics;  Laterality: Left;   LEG SURGERY      Family History  Problem Relation Age of Onset   Heart disease Other    Diabetes Other    Stroke Mother     Current Outpatient Medications on File Prior to Visit  Medication Sig Dispense Refill   acetaminophen (TYLENOL) 500 MG tablet Take 500 mg by mouth every 6 (six) hours as needed for mild pain or headache.     Amino Acids-Protein Hydrolys (FEEDING SUPPLEMENT, PRO-STAT SUGAR FREE 64,) LIQD Take 30 mLs by mouth in the morning and at bedtime.     Balsam [place]-Castor Oil (VENELEX) OINT Apply 1 application topically in the morning, at noon, and at bedtime. Every Shift; Day, Evening, Night apply to sacrum and bilateral buttocks     donepezil (ARICEPT) 5 MG tablet Take 5 mg by mouth daily.     feeding supplement (ENSURE ENLIVE / ENSURE PLUS) LIQD Take 237 mLs by mouth 3 (three) times daily with meals. 237 mL 12    levofloxacin (LEVAQUIN) 750 MG tablet Take 750 mg by mouth daily.     lisinopril (ZESTRIL) 40 MG tablet Take 1 tablet (40 mg total) by mouth daily. (FORMULARY SUB FOR RAMIPRIL 10MG ) 30 tablet 0   methocarbamol (ROBAXIN) 500 MG tablet Take 1 tablet (500 mg total) by mouth daily as needed for muscle spasms. Give prior to therapy 30 tablet 0   ondansetron (ZOFRAN-ODT) 4 MG disintegrating tablet Take 1 tablet (4 mg total) by mouth every 6 (six) hours as needed for nausea or vomiting. 20 tablet 0   polyethylene glycol (MIRALAX) 17 g packet Take 17 g by mouth daily. 14 each 0   ramipril (ALTACE) 10 MG capsule Take 10 mg by mouth daily.     sodium chloride 1 g tablet Take 1 tablet (1 g total) by mouth 2 (two) times daily. Special Instructions: for na++ 124 60 tablet 0   trolamine salicylate (ASPERCREME) 10 % cream Apply 1 application topically 2 (two) times daily as needed for muscle pain.     UNABLE TO FIND Diet: NAS     sertraline (ZOLOFT) 50 MG tablet Take 1 tablet (50 mg total) by mouth daily. 30 tablet 0   No current facility-administered medications on file prior to visit.    Allergies  Allergen Reactions   Demerol [Meperidine]  Anaphylaxis   Hydrocodone-Acetaminophen Nausea And Vomiting    Social History   Substance and Sexual Activity  Alcohol Use No    Social History   Tobacco Use  Smoking Status Never  Smokeless Tobacco Never    Review of Systems  Unable to perform ROS: Dementia   Objective   Vitals:   10/26/21 1445  BP: (!) 173/114  Pulse: 83  Resp: 12  Temp: 98.3 F (36.8 C)  SpO2: 96%    Physical Exam Vitals reviewed.  Constitutional:      Appearance: Normal appearance. She is normal weight. She is not ill-appearing.     Comments: Patient evaluated in wheelchair  HENT:     Head: Normocephalic and atraumatic.  Cardiovascular:     Rate and Rhythm: Normal rate and regular rhythm.     Heart sounds: Normal heart sounds. No murmur heard.   No friction rub. No  gallop.  Pulmonary:     Effort: Pulmonary effort is normal. No respiratory distress.     Breath sounds: Normal breath sounds. No stridor. No wheezing, rhonchi or rales.  Abdominal:     General: Abdomen is flat. Bowel sounds are normal. There is no distension.     Palpations: Abdomen is soft. There is no mass.     Tenderness: There is no abdominal tenderness. There is no guarding or rebound.     Hernia: A hernia is present.     Comments: Reducible right inguinal hernia noted.  Skin:    General: Skin is warm and dry.    Assessment  Right inguinal hernia, dementia, hospice care, deconditioned Plan  I told the caregiver that this is an easily reducible right inguinal hernia and there is no indication at this time to repair it.  Instructions were given on how to reduce an incarceration.  Patient is hospice, thus any surgical intervention is limited.  Follow-up as needed.

## 2021-10-28 DIAGNOSIS — J1282 Pneumonia due to coronavirus disease 2019: Secondary | ICD-10-CM | POA: Diagnosis not present

## 2021-10-28 DIAGNOSIS — E46 Unspecified protein-calorie malnutrition: Secondary | ICD-10-CM | POA: Diagnosis not present

## 2021-10-28 DIAGNOSIS — G311 Senile degeneration of brain, not elsewhere classified: Secondary | ICD-10-CM | POA: Diagnosis not present

## 2021-10-28 DIAGNOSIS — D62 Acute posthemorrhagic anemia: Secondary | ICD-10-CM | POA: Diagnosis not present

## 2021-10-28 DIAGNOSIS — Z8673 Personal history of transient ischemic attack (TIA), and cerebral infarction without residual deficits: Secondary | ICD-10-CM | POA: Diagnosis not present

## 2021-10-28 DIAGNOSIS — S72142D Displaced intertrochanteric fracture of left femur, subsequent encounter for closed fracture with routine healing: Secondary | ICD-10-CM | POA: Diagnosis not present

## 2021-10-29 DIAGNOSIS — S72142D Displaced intertrochanteric fracture of left femur, subsequent encounter for closed fracture with routine healing: Secondary | ICD-10-CM | POA: Diagnosis not present

## 2021-10-29 DIAGNOSIS — Z8673 Personal history of transient ischemic attack (TIA), and cerebral infarction without residual deficits: Secondary | ICD-10-CM | POA: Diagnosis not present

## 2021-10-29 DIAGNOSIS — D62 Acute posthemorrhagic anemia: Secondary | ICD-10-CM | POA: Diagnosis not present

## 2021-10-29 DIAGNOSIS — E46 Unspecified protein-calorie malnutrition: Secondary | ICD-10-CM | POA: Diagnosis not present

## 2021-10-29 DIAGNOSIS — J1282 Pneumonia due to coronavirus disease 2019: Secondary | ICD-10-CM | POA: Diagnosis not present

## 2021-10-29 DIAGNOSIS — G311 Senile degeneration of brain, not elsewhere classified: Secondary | ICD-10-CM | POA: Diagnosis not present

## 2021-11-03 DIAGNOSIS — E46 Unspecified protein-calorie malnutrition: Secondary | ICD-10-CM | POA: Diagnosis not present

## 2021-11-03 DIAGNOSIS — Z9181 History of falling: Secondary | ICD-10-CM | POA: Diagnosis not present

## 2021-11-03 DIAGNOSIS — M6281 Muscle weakness (generalized): Secondary | ICD-10-CM | POA: Diagnosis not present

## 2021-11-03 DIAGNOSIS — D62 Acute posthemorrhagic anemia: Secondary | ICD-10-CM | POA: Diagnosis not present

## 2021-11-03 DIAGNOSIS — G311 Senile degeneration of brain, not elsewhere classified: Secondary | ICD-10-CM | POA: Diagnosis not present

## 2021-11-03 DIAGNOSIS — S72142D Displaced intertrochanteric fracture of left femur, subsequent encounter for closed fracture with routine healing: Secondary | ICD-10-CM | POA: Diagnosis not present

## 2021-11-03 DIAGNOSIS — E785 Hyperlipidemia, unspecified: Secondary | ICD-10-CM | POA: Diagnosis not present

## 2021-11-03 DIAGNOSIS — R262 Difficulty in walking, not elsewhere classified: Secondary | ICD-10-CM | POA: Diagnosis not present

## 2021-11-03 DIAGNOSIS — F339 Major depressive disorder, recurrent, unspecified: Secondary | ICD-10-CM | POA: Diagnosis not present

## 2021-11-03 DIAGNOSIS — I700 Atherosclerosis of aorta: Secondary | ICD-10-CM | POA: Diagnosis not present

## 2021-11-03 DIAGNOSIS — J1282 Pneumonia due to coronavirus disease 2019: Secondary | ICD-10-CM | POA: Diagnosis not present

## 2021-11-03 DIAGNOSIS — R2681 Unsteadiness on feet: Secondary | ICD-10-CM | POA: Diagnosis not present

## 2021-11-03 DIAGNOSIS — Z8673 Personal history of transient ischemic attack (TIA), and cerebral infarction without residual deficits: Secondary | ICD-10-CM | POA: Diagnosis not present

## 2021-11-04 DIAGNOSIS — D62 Acute posthemorrhagic anemia: Secondary | ICD-10-CM | POA: Diagnosis not present

## 2021-11-04 DIAGNOSIS — Z8673 Personal history of transient ischemic attack (TIA), and cerebral infarction without residual deficits: Secondary | ICD-10-CM | POA: Diagnosis not present

## 2021-11-04 DIAGNOSIS — E46 Unspecified protein-calorie malnutrition: Secondary | ICD-10-CM | POA: Diagnosis not present

## 2021-11-04 DIAGNOSIS — S72142D Displaced intertrochanteric fracture of left femur, subsequent encounter for closed fracture with routine healing: Secondary | ICD-10-CM | POA: Diagnosis not present

## 2021-11-04 DIAGNOSIS — G311 Senile degeneration of brain, not elsewhere classified: Secondary | ICD-10-CM | POA: Diagnosis not present

## 2021-11-04 DIAGNOSIS — J1282 Pneumonia due to coronavirus disease 2019: Secondary | ICD-10-CM | POA: Diagnosis not present

## 2021-11-05 DIAGNOSIS — E46 Unspecified protein-calorie malnutrition: Secondary | ICD-10-CM | POA: Diagnosis not present

## 2021-11-05 DIAGNOSIS — G311 Senile degeneration of brain, not elsewhere classified: Secondary | ICD-10-CM | POA: Diagnosis not present

## 2021-11-05 DIAGNOSIS — D62 Acute posthemorrhagic anemia: Secondary | ICD-10-CM | POA: Diagnosis not present

## 2021-11-05 DIAGNOSIS — J1282 Pneumonia due to coronavirus disease 2019: Secondary | ICD-10-CM | POA: Diagnosis not present

## 2021-11-05 DIAGNOSIS — S72142D Displaced intertrochanteric fracture of left femur, subsequent encounter for closed fracture with routine healing: Secondary | ICD-10-CM | POA: Diagnosis not present

## 2021-11-05 DIAGNOSIS — Z8673 Personal history of transient ischemic attack (TIA), and cerebral infarction without residual deficits: Secondary | ICD-10-CM | POA: Diagnosis not present

## 2021-11-10 DIAGNOSIS — Z8673 Personal history of transient ischemic attack (TIA), and cerebral infarction without residual deficits: Secondary | ICD-10-CM | POA: Diagnosis not present

## 2021-11-10 DIAGNOSIS — D62 Acute posthemorrhagic anemia: Secondary | ICD-10-CM | POA: Diagnosis not present

## 2021-11-10 DIAGNOSIS — G311 Senile degeneration of brain, not elsewhere classified: Secondary | ICD-10-CM | POA: Diagnosis not present

## 2021-11-10 DIAGNOSIS — S72142D Displaced intertrochanteric fracture of left femur, subsequent encounter for closed fracture with routine healing: Secondary | ICD-10-CM | POA: Diagnosis not present

## 2021-11-10 DIAGNOSIS — E46 Unspecified protein-calorie malnutrition: Secondary | ICD-10-CM | POA: Diagnosis not present

## 2021-11-10 DIAGNOSIS — J1282 Pneumonia due to coronavirus disease 2019: Secondary | ICD-10-CM | POA: Diagnosis not present

## 2021-11-11 DIAGNOSIS — G311 Senile degeneration of brain, not elsewhere classified: Secondary | ICD-10-CM | POA: Diagnosis not present

## 2021-11-11 DIAGNOSIS — D62 Acute posthemorrhagic anemia: Secondary | ICD-10-CM | POA: Diagnosis not present

## 2021-11-11 DIAGNOSIS — S72142D Displaced intertrochanteric fracture of left femur, subsequent encounter for closed fracture with routine healing: Secondary | ICD-10-CM | POA: Diagnosis not present

## 2021-11-11 DIAGNOSIS — E46 Unspecified protein-calorie malnutrition: Secondary | ICD-10-CM | POA: Diagnosis not present

## 2021-11-11 DIAGNOSIS — Z8673 Personal history of transient ischemic attack (TIA), and cerebral infarction without residual deficits: Secondary | ICD-10-CM | POA: Diagnosis not present

## 2021-11-11 DIAGNOSIS — J1282 Pneumonia due to coronavirus disease 2019: Secondary | ICD-10-CM | POA: Diagnosis not present

## 2021-11-12 DIAGNOSIS — Z8673 Personal history of transient ischemic attack (TIA), and cerebral infarction without residual deficits: Secondary | ICD-10-CM | POA: Diagnosis not present

## 2021-11-12 DIAGNOSIS — G311 Senile degeneration of brain, not elsewhere classified: Secondary | ICD-10-CM | POA: Diagnosis not present

## 2021-11-12 DIAGNOSIS — D62 Acute posthemorrhagic anemia: Secondary | ICD-10-CM | POA: Diagnosis not present

## 2021-11-12 DIAGNOSIS — E46 Unspecified protein-calorie malnutrition: Secondary | ICD-10-CM | POA: Diagnosis not present

## 2021-11-12 DIAGNOSIS — S72142D Displaced intertrochanteric fracture of left femur, subsequent encounter for closed fracture with routine healing: Secondary | ICD-10-CM | POA: Diagnosis not present

## 2021-11-12 DIAGNOSIS — J1282 Pneumonia due to coronavirus disease 2019: Secondary | ICD-10-CM | POA: Diagnosis not present

## 2021-11-18 DIAGNOSIS — S72142D Displaced intertrochanteric fracture of left femur, subsequent encounter for closed fracture with routine healing: Secondary | ICD-10-CM | POA: Diagnosis not present

## 2021-11-18 DIAGNOSIS — E46 Unspecified protein-calorie malnutrition: Secondary | ICD-10-CM | POA: Diagnosis not present

## 2021-11-18 DIAGNOSIS — D62 Acute posthemorrhagic anemia: Secondary | ICD-10-CM | POA: Diagnosis not present

## 2021-11-18 DIAGNOSIS — Z8673 Personal history of transient ischemic attack (TIA), and cerebral infarction without residual deficits: Secondary | ICD-10-CM | POA: Diagnosis not present

## 2021-11-18 DIAGNOSIS — J1282 Pneumonia due to coronavirus disease 2019: Secondary | ICD-10-CM | POA: Diagnosis not present

## 2021-11-18 DIAGNOSIS — G311 Senile degeneration of brain, not elsewhere classified: Secondary | ICD-10-CM | POA: Diagnosis not present

## 2021-11-19 DIAGNOSIS — Z8673 Personal history of transient ischemic attack (TIA), and cerebral infarction without residual deficits: Secondary | ICD-10-CM | POA: Diagnosis not present

## 2021-11-19 DIAGNOSIS — J1282 Pneumonia due to coronavirus disease 2019: Secondary | ICD-10-CM | POA: Diagnosis not present

## 2021-11-19 DIAGNOSIS — D62 Acute posthemorrhagic anemia: Secondary | ICD-10-CM | POA: Diagnosis not present

## 2021-11-19 DIAGNOSIS — G311 Senile degeneration of brain, not elsewhere classified: Secondary | ICD-10-CM | POA: Diagnosis not present

## 2021-11-19 DIAGNOSIS — S72142D Displaced intertrochanteric fracture of left femur, subsequent encounter for closed fracture with routine healing: Secondary | ICD-10-CM | POA: Diagnosis not present

## 2021-11-19 DIAGNOSIS — E46 Unspecified protein-calorie malnutrition: Secondary | ICD-10-CM | POA: Diagnosis not present

## 2021-11-24 DIAGNOSIS — G311 Senile degeneration of brain, not elsewhere classified: Secondary | ICD-10-CM | POA: Diagnosis not present

## 2021-11-24 DIAGNOSIS — Z8673 Personal history of transient ischemic attack (TIA), and cerebral infarction without residual deficits: Secondary | ICD-10-CM | POA: Diagnosis not present

## 2021-11-24 DIAGNOSIS — J1282 Pneumonia due to coronavirus disease 2019: Secondary | ICD-10-CM | POA: Diagnosis not present

## 2021-11-24 DIAGNOSIS — S72142D Displaced intertrochanteric fracture of left femur, subsequent encounter for closed fracture with routine healing: Secondary | ICD-10-CM | POA: Diagnosis not present

## 2021-11-24 DIAGNOSIS — E46 Unspecified protein-calorie malnutrition: Secondary | ICD-10-CM | POA: Diagnosis not present

## 2021-11-24 DIAGNOSIS — D62 Acute posthemorrhagic anemia: Secondary | ICD-10-CM | POA: Diagnosis not present

## 2021-11-25 DIAGNOSIS — E46 Unspecified protein-calorie malnutrition: Secondary | ICD-10-CM | POA: Diagnosis not present

## 2021-11-25 DIAGNOSIS — Z8673 Personal history of transient ischemic attack (TIA), and cerebral infarction without residual deficits: Secondary | ICD-10-CM | POA: Diagnosis not present

## 2021-11-25 DIAGNOSIS — D62 Acute posthemorrhagic anemia: Secondary | ICD-10-CM | POA: Diagnosis not present

## 2021-11-25 DIAGNOSIS — J1282 Pneumonia due to coronavirus disease 2019: Secondary | ICD-10-CM | POA: Diagnosis not present

## 2021-11-25 DIAGNOSIS — G311 Senile degeneration of brain, not elsewhere classified: Secondary | ICD-10-CM | POA: Diagnosis not present

## 2021-11-25 DIAGNOSIS — S72142D Displaced intertrochanteric fracture of left femur, subsequent encounter for closed fracture with routine healing: Secondary | ICD-10-CM | POA: Diagnosis not present

## 2021-11-29 DIAGNOSIS — Z8673 Personal history of transient ischemic attack (TIA), and cerebral infarction without residual deficits: Secondary | ICD-10-CM | POA: Diagnosis not present

## 2021-11-29 DIAGNOSIS — J1282 Pneumonia due to coronavirus disease 2019: Secondary | ICD-10-CM | POA: Diagnosis not present

## 2021-11-29 DIAGNOSIS — G311 Senile degeneration of brain, not elsewhere classified: Secondary | ICD-10-CM | POA: Diagnosis not present

## 2021-11-29 DIAGNOSIS — S72142D Displaced intertrochanteric fracture of left femur, subsequent encounter for closed fracture with routine healing: Secondary | ICD-10-CM | POA: Diagnosis not present

## 2021-11-29 DIAGNOSIS — D62 Acute posthemorrhagic anemia: Secondary | ICD-10-CM | POA: Diagnosis not present

## 2021-11-29 DIAGNOSIS — E46 Unspecified protein-calorie malnutrition: Secondary | ICD-10-CM | POA: Diagnosis not present

## 2021-12-02 DIAGNOSIS — Z8673 Personal history of transient ischemic attack (TIA), and cerebral infarction without residual deficits: Secondary | ICD-10-CM | POA: Diagnosis not present

## 2021-12-02 DIAGNOSIS — S72142D Displaced intertrochanteric fracture of left femur, subsequent encounter for closed fracture with routine healing: Secondary | ICD-10-CM | POA: Diagnosis not present

## 2021-12-02 DIAGNOSIS — E46 Unspecified protein-calorie malnutrition: Secondary | ICD-10-CM | POA: Diagnosis not present

## 2021-12-02 DIAGNOSIS — D62 Acute posthemorrhagic anemia: Secondary | ICD-10-CM | POA: Diagnosis not present

## 2021-12-02 DIAGNOSIS — G311 Senile degeneration of brain, not elsewhere classified: Secondary | ICD-10-CM | POA: Diagnosis not present

## 2021-12-02 DIAGNOSIS — J1282 Pneumonia due to coronavirus disease 2019: Secondary | ICD-10-CM | POA: Diagnosis not present

## 2021-12-03 DIAGNOSIS — Z8673 Personal history of transient ischemic attack (TIA), and cerebral infarction without residual deficits: Secondary | ICD-10-CM | POA: Diagnosis not present

## 2021-12-03 DIAGNOSIS — J1282 Pneumonia due to coronavirus disease 2019: Secondary | ICD-10-CM | POA: Diagnosis not present

## 2021-12-03 DIAGNOSIS — G311 Senile degeneration of brain, not elsewhere classified: Secondary | ICD-10-CM | POA: Diagnosis not present

## 2021-12-03 DIAGNOSIS — S72142D Displaced intertrochanteric fracture of left femur, subsequent encounter for closed fracture with routine healing: Secondary | ICD-10-CM | POA: Diagnosis not present

## 2021-12-03 DIAGNOSIS — D62 Acute posthemorrhagic anemia: Secondary | ICD-10-CM | POA: Diagnosis not present

## 2021-12-03 DIAGNOSIS — E46 Unspecified protein-calorie malnutrition: Secondary | ICD-10-CM | POA: Diagnosis not present

## 2021-12-04 DIAGNOSIS — E46 Unspecified protein-calorie malnutrition: Secondary | ICD-10-CM | POA: Diagnosis not present

## 2021-12-04 DIAGNOSIS — F339 Major depressive disorder, recurrent, unspecified: Secondary | ICD-10-CM | POA: Diagnosis not present

## 2021-12-04 DIAGNOSIS — E785 Hyperlipidemia, unspecified: Secondary | ICD-10-CM | POA: Diagnosis not present

## 2021-12-04 DIAGNOSIS — Z8673 Personal history of transient ischemic attack (TIA), and cerebral infarction without residual deficits: Secondary | ICD-10-CM | POA: Diagnosis not present

## 2021-12-04 DIAGNOSIS — J1282 Pneumonia due to coronavirus disease 2019: Secondary | ICD-10-CM | POA: Diagnosis not present

## 2021-12-04 DIAGNOSIS — R262 Difficulty in walking, not elsewhere classified: Secondary | ICD-10-CM | POA: Diagnosis not present

## 2021-12-04 DIAGNOSIS — G311 Senile degeneration of brain, not elsewhere classified: Secondary | ICD-10-CM | POA: Diagnosis not present

## 2021-12-04 DIAGNOSIS — I700 Atherosclerosis of aorta: Secondary | ICD-10-CM | POA: Diagnosis not present

## 2021-12-04 DIAGNOSIS — D62 Acute posthemorrhagic anemia: Secondary | ICD-10-CM | POA: Diagnosis not present

## 2021-12-04 DIAGNOSIS — S72142D Displaced intertrochanteric fracture of left femur, subsequent encounter for closed fracture with routine healing: Secondary | ICD-10-CM | POA: Diagnosis not present

## 2021-12-04 DIAGNOSIS — R2681 Unsteadiness on feet: Secondary | ICD-10-CM | POA: Diagnosis not present

## 2021-12-04 DIAGNOSIS — Z9181 History of falling: Secondary | ICD-10-CM | POA: Diagnosis not present

## 2021-12-04 DIAGNOSIS — M6281 Muscle weakness (generalized): Secondary | ICD-10-CM | POA: Diagnosis not present

## 2021-12-09 DIAGNOSIS — G311 Senile degeneration of brain, not elsewhere classified: Secondary | ICD-10-CM | POA: Diagnosis not present

## 2021-12-09 DIAGNOSIS — Z8673 Personal history of transient ischemic attack (TIA), and cerebral infarction without residual deficits: Secondary | ICD-10-CM | POA: Diagnosis not present

## 2021-12-09 DIAGNOSIS — S72142D Displaced intertrochanteric fracture of left femur, subsequent encounter for closed fracture with routine healing: Secondary | ICD-10-CM | POA: Diagnosis not present

## 2021-12-09 DIAGNOSIS — D62 Acute posthemorrhagic anemia: Secondary | ICD-10-CM | POA: Diagnosis not present

## 2021-12-09 DIAGNOSIS — J1282 Pneumonia due to coronavirus disease 2019: Secondary | ICD-10-CM | POA: Diagnosis not present

## 2021-12-09 DIAGNOSIS — E46 Unspecified protein-calorie malnutrition: Secondary | ICD-10-CM | POA: Diagnosis not present

## 2021-12-11 DIAGNOSIS — J1282 Pneumonia due to coronavirus disease 2019: Secondary | ICD-10-CM | POA: Diagnosis not present

## 2021-12-11 DIAGNOSIS — S72142D Displaced intertrochanteric fracture of left femur, subsequent encounter for closed fracture with routine healing: Secondary | ICD-10-CM | POA: Diagnosis not present

## 2021-12-11 DIAGNOSIS — D62 Acute posthemorrhagic anemia: Secondary | ICD-10-CM | POA: Diagnosis not present

## 2021-12-11 DIAGNOSIS — Z8673 Personal history of transient ischemic attack (TIA), and cerebral infarction without residual deficits: Secondary | ICD-10-CM | POA: Diagnosis not present

## 2021-12-11 DIAGNOSIS — E46 Unspecified protein-calorie malnutrition: Secondary | ICD-10-CM | POA: Diagnosis not present

## 2021-12-11 DIAGNOSIS — G311 Senile degeneration of brain, not elsewhere classified: Secondary | ICD-10-CM | POA: Diagnosis not present

## 2021-12-16 DIAGNOSIS — S72142D Displaced intertrochanteric fracture of left femur, subsequent encounter for closed fracture with routine healing: Secondary | ICD-10-CM | POA: Diagnosis not present

## 2021-12-16 DIAGNOSIS — G311 Senile degeneration of brain, not elsewhere classified: Secondary | ICD-10-CM | POA: Diagnosis not present

## 2021-12-16 DIAGNOSIS — D62 Acute posthemorrhagic anemia: Secondary | ICD-10-CM | POA: Diagnosis not present

## 2021-12-16 DIAGNOSIS — E46 Unspecified protein-calorie malnutrition: Secondary | ICD-10-CM | POA: Diagnosis not present

## 2021-12-16 DIAGNOSIS — J1282 Pneumonia due to coronavirus disease 2019: Secondary | ICD-10-CM | POA: Diagnosis not present

## 2021-12-16 DIAGNOSIS — Z8673 Personal history of transient ischemic attack (TIA), and cerebral infarction without residual deficits: Secondary | ICD-10-CM | POA: Diagnosis not present

## 2021-12-17 DIAGNOSIS — D62 Acute posthemorrhagic anemia: Secondary | ICD-10-CM | POA: Diagnosis not present

## 2021-12-17 DIAGNOSIS — S72142D Displaced intertrochanteric fracture of left femur, subsequent encounter for closed fracture with routine healing: Secondary | ICD-10-CM | POA: Diagnosis not present

## 2021-12-17 DIAGNOSIS — G311 Senile degeneration of brain, not elsewhere classified: Secondary | ICD-10-CM | POA: Diagnosis not present

## 2021-12-17 DIAGNOSIS — E46 Unspecified protein-calorie malnutrition: Secondary | ICD-10-CM | POA: Diagnosis not present

## 2021-12-17 DIAGNOSIS — J1282 Pneumonia due to coronavirus disease 2019: Secondary | ICD-10-CM | POA: Diagnosis not present

## 2021-12-17 DIAGNOSIS — Z8673 Personal history of transient ischemic attack (TIA), and cerebral infarction without residual deficits: Secondary | ICD-10-CM | POA: Diagnosis not present

## 2021-12-21 DIAGNOSIS — D62 Acute posthemorrhagic anemia: Secondary | ICD-10-CM | POA: Diagnosis not present

## 2021-12-21 DIAGNOSIS — S72142D Displaced intertrochanteric fracture of left femur, subsequent encounter for closed fracture with routine healing: Secondary | ICD-10-CM | POA: Diagnosis not present

## 2021-12-21 DIAGNOSIS — E46 Unspecified protein-calorie malnutrition: Secondary | ICD-10-CM | POA: Diagnosis not present

## 2021-12-21 DIAGNOSIS — Z8673 Personal history of transient ischemic attack (TIA), and cerebral infarction without residual deficits: Secondary | ICD-10-CM | POA: Diagnosis not present

## 2021-12-21 DIAGNOSIS — G311 Senile degeneration of brain, not elsewhere classified: Secondary | ICD-10-CM | POA: Diagnosis not present

## 2021-12-21 DIAGNOSIS — J1282 Pneumonia due to coronavirus disease 2019: Secondary | ICD-10-CM | POA: Diagnosis not present

## 2021-12-23 DIAGNOSIS — Z8673 Personal history of transient ischemic attack (TIA), and cerebral infarction without residual deficits: Secondary | ICD-10-CM | POA: Diagnosis not present

## 2021-12-23 DIAGNOSIS — E46 Unspecified protein-calorie malnutrition: Secondary | ICD-10-CM | POA: Diagnosis not present

## 2021-12-23 DIAGNOSIS — J1282 Pneumonia due to coronavirus disease 2019: Secondary | ICD-10-CM | POA: Diagnosis not present

## 2021-12-23 DIAGNOSIS — D62 Acute posthemorrhagic anemia: Secondary | ICD-10-CM | POA: Diagnosis not present

## 2021-12-23 DIAGNOSIS — S72142D Displaced intertrochanteric fracture of left femur, subsequent encounter for closed fracture with routine healing: Secondary | ICD-10-CM | POA: Diagnosis not present

## 2021-12-23 DIAGNOSIS — G311 Senile degeneration of brain, not elsewhere classified: Secondary | ICD-10-CM | POA: Diagnosis not present

## 2021-12-28 DIAGNOSIS — G311 Senile degeneration of brain, not elsewhere classified: Secondary | ICD-10-CM | POA: Diagnosis not present

## 2021-12-28 DIAGNOSIS — J1282 Pneumonia due to coronavirus disease 2019: Secondary | ICD-10-CM | POA: Diagnosis not present

## 2021-12-28 DIAGNOSIS — S72142D Displaced intertrochanteric fracture of left femur, subsequent encounter for closed fracture with routine healing: Secondary | ICD-10-CM | POA: Diagnosis not present

## 2021-12-28 DIAGNOSIS — E46 Unspecified protein-calorie malnutrition: Secondary | ICD-10-CM | POA: Diagnosis not present

## 2021-12-28 DIAGNOSIS — D62 Acute posthemorrhagic anemia: Secondary | ICD-10-CM | POA: Diagnosis not present

## 2021-12-28 DIAGNOSIS — Z8673 Personal history of transient ischemic attack (TIA), and cerebral infarction without residual deficits: Secondary | ICD-10-CM | POA: Diagnosis not present

## 2021-12-30 DIAGNOSIS — D62 Acute posthemorrhagic anemia: Secondary | ICD-10-CM | POA: Diagnosis not present

## 2021-12-30 DIAGNOSIS — S72142D Displaced intertrochanteric fracture of left femur, subsequent encounter for closed fracture with routine healing: Secondary | ICD-10-CM | POA: Diagnosis not present

## 2021-12-30 DIAGNOSIS — E46 Unspecified protein-calorie malnutrition: Secondary | ICD-10-CM | POA: Diagnosis not present

## 2021-12-30 DIAGNOSIS — J1282 Pneumonia due to coronavirus disease 2019: Secondary | ICD-10-CM | POA: Diagnosis not present

## 2021-12-30 DIAGNOSIS — Z8673 Personal history of transient ischemic attack (TIA), and cerebral infarction without residual deficits: Secondary | ICD-10-CM | POA: Diagnosis not present

## 2021-12-30 DIAGNOSIS — G311 Senile degeneration of brain, not elsewhere classified: Secondary | ICD-10-CM | POA: Diagnosis not present

## 2022-01-03 DIAGNOSIS — Z8673 Personal history of transient ischemic attack (TIA), and cerebral infarction without residual deficits: Secondary | ICD-10-CM | POA: Diagnosis not present

## 2022-01-03 DIAGNOSIS — D62 Acute posthemorrhagic anemia: Secondary | ICD-10-CM | POA: Diagnosis not present

## 2022-01-03 DIAGNOSIS — R262 Difficulty in walking, not elsewhere classified: Secondary | ICD-10-CM | POA: Diagnosis not present

## 2022-01-03 DIAGNOSIS — R2681 Unsteadiness on feet: Secondary | ICD-10-CM | POA: Diagnosis not present

## 2022-01-03 DIAGNOSIS — M6281 Muscle weakness (generalized): Secondary | ICD-10-CM | POA: Diagnosis not present

## 2022-01-03 DIAGNOSIS — I700 Atherosclerosis of aorta: Secondary | ICD-10-CM | POA: Diagnosis not present

## 2022-01-03 DIAGNOSIS — G311 Senile degeneration of brain, not elsewhere classified: Secondary | ICD-10-CM | POA: Diagnosis not present

## 2022-01-03 DIAGNOSIS — F339 Major depressive disorder, recurrent, unspecified: Secondary | ICD-10-CM | POA: Diagnosis not present

## 2022-01-03 DIAGNOSIS — E785 Hyperlipidemia, unspecified: Secondary | ICD-10-CM | POA: Diagnosis not present

## 2022-01-03 DIAGNOSIS — S72142D Displaced intertrochanteric fracture of left femur, subsequent encounter for closed fracture with routine healing: Secondary | ICD-10-CM | POA: Diagnosis not present

## 2022-01-03 DIAGNOSIS — J1282 Pneumonia due to coronavirus disease 2019: Secondary | ICD-10-CM | POA: Diagnosis not present

## 2022-01-03 DIAGNOSIS — Z9181 History of falling: Secondary | ICD-10-CM | POA: Diagnosis not present

## 2022-01-03 DIAGNOSIS — E46 Unspecified protein-calorie malnutrition: Secondary | ICD-10-CM | POA: Diagnosis not present

## 2022-01-05 DIAGNOSIS — D62 Acute posthemorrhagic anemia: Secondary | ICD-10-CM | POA: Diagnosis not present

## 2022-01-05 DIAGNOSIS — S72142D Displaced intertrochanteric fracture of left femur, subsequent encounter for closed fracture with routine healing: Secondary | ICD-10-CM | POA: Diagnosis not present

## 2022-01-05 DIAGNOSIS — G311 Senile degeneration of brain, not elsewhere classified: Secondary | ICD-10-CM | POA: Diagnosis not present

## 2022-01-05 DIAGNOSIS — E46 Unspecified protein-calorie malnutrition: Secondary | ICD-10-CM | POA: Diagnosis not present

## 2022-01-05 DIAGNOSIS — J1282 Pneumonia due to coronavirus disease 2019: Secondary | ICD-10-CM | POA: Diagnosis not present

## 2022-01-05 DIAGNOSIS — Z8673 Personal history of transient ischemic attack (TIA), and cerebral infarction without residual deficits: Secondary | ICD-10-CM | POA: Diagnosis not present

## 2022-01-06 DIAGNOSIS — S72142D Displaced intertrochanteric fracture of left femur, subsequent encounter for closed fracture with routine healing: Secondary | ICD-10-CM | POA: Diagnosis not present

## 2022-01-06 DIAGNOSIS — E46 Unspecified protein-calorie malnutrition: Secondary | ICD-10-CM | POA: Diagnosis not present

## 2022-01-06 DIAGNOSIS — J1282 Pneumonia due to coronavirus disease 2019: Secondary | ICD-10-CM | POA: Diagnosis not present

## 2022-01-06 DIAGNOSIS — D62 Acute posthemorrhagic anemia: Secondary | ICD-10-CM | POA: Diagnosis not present

## 2022-01-06 DIAGNOSIS — Z8673 Personal history of transient ischemic attack (TIA), and cerebral infarction without residual deficits: Secondary | ICD-10-CM | POA: Diagnosis not present

## 2022-01-06 DIAGNOSIS — G311 Senile degeneration of brain, not elsewhere classified: Secondary | ICD-10-CM | POA: Diagnosis not present

## 2022-01-08 DIAGNOSIS — S72142D Displaced intertrochanteric fracture of left femur, subsequent encounter for closed fracture with routine healing: Secondary | ICD-10-CM | POA: Diagnosis not present

## 2022-01-08 DIAGNOSIS — J1282 Pneumonia due to coronavirus disease 2019: Secondary | ICD-10-CM | POA: Diagnosis not present

## 2022-01-08 DIAGNOSIS — G311 Senile degeneration of brain, not elsewhere classified: Secondary | ICD-10-CM | POA: Diagnosis not present

## 2022-01-08 DIAGNOSIS — D62 Acute posthemorrhagic anemia: Secondary | ICD-10-CM | POA: Diagnosis not present

## 2022-01-08 DIAGNOSIS — E46 Unspecified protein-calorie malnutrition: Secondary | ICD-10-CM | POA: Diagnosis not present

## 2022-01-08 DIAGNOSIS — Z8673 Personal history of transient ischemic attack (TIA), and cerebral infarction without residual deficits: Secondary | ICD-10-CM | POA: Diagnosis not present

## 2022-01-13 DIAGNOSIS — Z8673 Personal history of transient ischemic attack (TIA), and cerebral infarction without residual deficits: Secondary | ICD-10-CM | POA: Diagnosis not present

## 2022-01-13 DIAGNOSIS — J1282 Pneumonia due to coronavirus disease 2019: Secondary | ICD-10-CM | POA: Diagnosis not present

## 2022-01-13 DIAGNOSIS — S72142D Displaced intertrochanteric fracture of left femur, subsequent encounter for closed fracture with routine healing: Secondary | ICD-10-CM | POA: Diagnosis not present

## 2022-01-13 DIAGNOSIS — E46 Unspecified protein-calorie malnutrition: Secondary | ICD-10-CM | POA: Diagnosis not present

## 2022-01-13 DIAGNOSIS — D62 Acute posthemorrhagic anemia: Secondary | ICD-10-CM | POA: Diagnosis not present

## 2022-01-13 DIAGNOSIS — G311 Senile degeneration of brain, not elsewhere classified: Secondary | ICD-10-CM | POA: Diagnosis not present

## 2022-01-20 DIAGNOSIS — E46 Unspecified protein-calorie malnutrition: Secondary | ICD-10-CM | POA: Diagnosis not present

## 2022-01-20 DIAGNOSIS — S72142D Displaced intertrochanteric fracture of left femur, subsequent encounter for closed fracture with routine healing: Secondary | ICD-10-CM | POA: Diagnosis not present

## 2022-01-20 DIAGNOSIS — G311 Senile degeneration of brain, not elsewhere classified: Secondary | ICD-10-CM | POA: Diagnosis not present

## 2022-01-20 DIAGNOSIS — J1282 Pneumonia due to coronavirus disease 2019: Secondary | ICD-10-CM | POA: Diagnosis not present

## 2022-01-20 DIAGNOSIS — D62 Acute posthemorrhagic anemia: Secondary | ICD-10-CM | POA: Diagnosis not present

## 2022-01-20 DIAGNOSIS — Z8673 Personal history of transient ischemic attack (TIA), and cerebral infarction without residual deficits: Secondary | ICD-10-CM | POA: Diagnosis not present

## 2022-01-24 DIAGNOSIS — E46 Unspecified protein-calorie malnutrition: Secondary | ICD-10-CM | POA: Diagnosis not present

## 2022-01-24 DIAGNOSIS — J1282 Pneumonia due to coronavirus disease 2019: Secondary | ICD-10-CM | POA: Diagnosis not present

## 2022-01-24 DIAGNOSIS — S72142D Displaced intertrochanteric fracture of left femur, subsequent encounter for closed fracture with routine healing: Secondary | ICD-10-CM | POA: Diagnosis not present

## 2022-01-24 DIAGNOSIS — Z8673 Personal history of transient ischemic attack (TIA), and cerebral infarction without residual deficits: Secondary | ICD-10-CM | POA: Diagnosis not present

## 2022-01-24 DIAGNOSIS — G311 Senile degeneration of brain, not elsewhere classified: Secondary | ICD-10-CM | POA: Diagnosis not present

## 2022-01-24 DIAGNOSIS — D62 Acute posthemorrhagic anemia: Secondary | ICD-10-CM | POA: Diagnosis not present

## 2022-01-25 DIAGNOSIS — E46 Unspecified protein-calorie malnutrition: Secondary | ICD-10-CM | POA: Diagnosis not present

## 2022-01-25 DIAGNOSIS — D62 Acute posthemorrhagic anemia: Secondary | ICD-10-CM | POA: Diagnosis not present

## 2022-01-25 DIAGNOSIS — G311 Senile degeneration of brain, not elsewhere classified: Secondary | ICD-10-CM | POA: Diagnosis not present

## 2022-01-25 DIAGNOSIS — Z8673 Personal history of transient ischemic attack (TIA), and cerebral infarction without residual deficits: Secondary | ICD-10-CM | POA: Diagnosis not present

## 2022-01-25 DIAGNOSIS — J1282 Pneumonia due to coronavirus disease 2019: Secondary | ICD-10-CM | POA: Diagnosis not present

## 2022-01-25 DIAGNOSIS — S72142D Displaced intertrochanteric fracture of left femur, subsequent encounter for closed fracture with routine healing: Secondary | ICD-10-CM | POA: Diagnosis not present

## 2022-01-27 DIAGNOSIS — S72142D Displaced intertrochanteric fracture of left femur, subsequent encounter for closed fracture with routine healing: Secondary | ICD-10-CM | POA: Diagnosis not present

## 2022-01-27 DIAGNOSIS — J1282 Pneumonia due to coronavirus disease 2019: Secondary | ICD-10-CM | POA: Diagnosis not present

## 2022-01-27 DIAGNOSIS — D62 Acute posthemorrhagic anemia: Secondary | ICD-10-CM | POA: Diagnosis not present

## 2022-01-27 DIAGNOSIS — Z8673 Personal history of transient ischemic attack (TIA), and cerebral infarction without residual deficits: Secondary | ICD-10-CM | POA: Diagnosis not present

## 2022-01-27 DIAGNOSIS — G311 Senile degeneration of brain, not elsewhere classified: Secondary | ICD-10-CM | POA: Diagnosis not present

## 2022-01-27 DIAGNOSIS — E46 Unspecified protein-calorie malnutrition: Secondary | ICD-10-CM | POA: Diagnosis not present

## 2022-02-03 DIAGNOSIS — R262 Difficulty in walking, not elsewhere classified: Secondary | ICD-10-CM | POA: Diagnosis not present

## 2022-02-03 DIAGNOSIS — S72142D Displaced intertrochanteric fracture of left femur, subsequent encounter for closed fracture with routine healing: Secondary | ICD-10-CM | POA: Diagnosis not present

## 2022-02-03 DIAGNOSIS — R2681 Unsteadiness on feet: Secondary | ICD-10-CM | POA: Diagnosis not present

## 2022-02-03 DIAGNOSIS — Z8673 Personal history of transient ischemic attack (TIA), and cerebral infarction without residual deficits: Secondary | ICD-10-CM | POA: Diagnosis not present

## 2022-02-03 DIAGNOSIS — E785 Hyperlipidemia, unspecified: Secondary | ICD-10-CM | POA: Diagnosis not present

## 2022-02-03 DIAGNOSIS — I700 Atherosclerosis of aorta: Secondary | ICD-10-CM | POA: Diagnosis not present

## 2022-02-03 DIAGNOSIS — F339 Major depressive disorder, recurrent, unspecified: Secondary | ICD-10-CM | POA: Diagnosis not present

## 2022-02-03 DIAGNOSIS — G311 Senile degeneration of brain, not elsewhere classified: Secondary | ICD-10-CM | POA: Diagnosis not present

## 2022-02-03 DIAGNOSIS — D62 Acute posthemorrhagic anemia: Secondary | ICD-10-CM | POA: Diagnosis not present

## 2022-02-03 DIAGNOSIS — J1282 Pneumonia due to coronavirus disease 2019: Secondary | ICD-10-CM | POA: Diagnosis not present

## 2022-02-03 DIAGNOSIS — M6281 Muscle weakness (generalized): Secondary | ICD-10-CM | POA: Diagnosis not present

## 2022-02-03 DIAGNOSIS — E46 Unspecified protein-calorie malnutrition: Secondary | ICD-10-CM | POA: Diagnosis not present

## 2022-02-03 DIAGNOSIS — Z9181 History of falling: Secondary | ICD-10-CM | POA: Diagnosis not present

## 2022-02-07 DIAGNOSIS — S72142D Displaced intertrochanteric fracture of left femur, subsequent encounter for closed fracture with routine healing: Secondary | ICD-10-CM | POA: Diagnosis not present

## 2022-02-07 DIAGNOSIS — G311 Senile degeneration of brain, not elsewhere classified: Secondary | ICD-10-CM | POA: Diagnosis not present

## 2022-02-07 DIAGNOSIS — E46 Unspecified protein-calorie malnutrition: Secondary | ICD-10-CM | POA: Diagnosis not present

## 2022-02-07 DIAGNOSIS — J1282 Pneumonia due to coronavirus disease 2019: Secondary | ICD-10-CM | POA: Diagnosis not present

## 2022-03-15 DIAGNOSIS — E46 Unspecified protein-calorie malnutrition: Secondary | ICD-10-CM | POA: Diagnosis not present

## 2022-03-15 DIAGNOSIS — Z515 Encounter for palliative care: Secondary | ICD-10-CM | POA: Diagnosis not present

## 2022-03-15 DIAGNOSIS — Z681 Body mass index (BMI) 19 or less, adult: Secondary | ICD-10-CM | POA: Diagnosis not present

## 2022-03-15 DIAGNOSIS — F331 Major depressive disorder, recurrent, moderate: Secondary | ICD-10-CM | POA: Diagnosis not present

## 2022-03-15 DIAGNOSIS — F411 Generalized anxiety disorder: Secondary | ICD-10-CM | POA: Diagnosis not present

## 2022-04-22 ENCOUNTER — Telehealth: Payer: Self-pay | Admitting: *Deleted

## 2022-04-22 NOTE — Patient Outreach (Signed)
  Care Coordination   04/22/2022 Name: Amy Sawyer MRN: [id number] DOB: 06-22-1931   Care Coordination Outreach Attempts:  An unsuccessful telephone outreach was attempted today to offer the patient information about available care coordination services as a benefit of their health plan.   Follow Up Plan:  Additional outreach attempts will be made to offer the patient care coordination information and services.   Encounter Outcome:  No Answer  Care Coordination Interventions Activated:  No   Care Coordination Interventions:  No, not indicated    Chong Sicilian, BSN, RN-BC RN Care Coordinator [place]: [phone]

## 2022-04-26 DIAGNOSIS — M810 Age-related osteoporosis without current pathological fracture: Secondary | ICD-10-CM | POA: Diagnosis not present

## 2022-04-26 DIAGNOSIS — I10 Essential (primary) hypertension: Secondary | ICD-10-CM | POA: Diagnosis not present

## 2022-04-26 DIAGNOSIS — F329 Major depressive disorder, single episode, unspecified: Secondary | ICD-10-CM | POA: Diagnosis not present

## 2022-05-17 ENCOUNTER — Telehealth: Payer: Self-pay | Admitting: *Deleted

## 2022-05-17 NOTE — Patient Outreach (Signed)
  Care Coordination   05/17/2022 Name: Amy Sawyer MRN: [id number] DOB: [birth year]   Care Coordination Outreach Attempts:  A second unsuccessful outreach was attempted today to offer the patient with information about available care coordination services as a benefit of their health plan.     Follow Up Plan:  Additional outreach attempts will be made to offer the patient care coordination information and services.   Encounter Outcome:  No Answer  Care Coordination Interventions Activated:  No   Care Coordination Interventions:  No, not indicated    Chong Sicilian, BSN, RN-BC RN Care Coordinator [place]: [phone] Main #: [phone]

## 2022-05-24 DIAGNOSIS — Z515 Encounter for palliative care: Secondary | ICD-10-CM | POA: Diagnosis not present

## 2022-05-24 DIAGNOSIS — F03B18 Unspecified dementia, moderate, with other behavioral disturbance: Secondary | ICD-10-CM | POA: Diagnosis not present

## 2022-05-24 DIAGNOSIS — Z681 Body mass index (BMI) 19 or less, adult: Secondary | ICD-10-CM | POA: Diagnosis not present

## 2022-06-03 ENCOUNTER — Encounter: Payer: Self-pay | Admitting: *Deleted

## 2022-06-03 ENCOUNTER — Telehealth: Payer: Self-pay | Admitting: *Deleted

## 2022-06-03 NOTE — Patient Instructions (Signed)
Visit Information  Thank you for taking time to visit with me today. Please don't hesitate to contact me if I can be of assistance to you.  Following are the goals we discussed today:  Call palliative care team to follow up on question regarding dementia medications  Please call the Suicide and Crisis Lifeline: 988 call the [place] National Suicide Prevention Lifeline: 575-605-2896 or TTY: 323-778-6633 TTY 707-662-1660) to talk to a trained counselor call 1-800-273-TALK (toll free, 24 hour hotline) call the [facility]: [phone] call 911 if you are experiencing a Mental Health or [place] or need someone to talk to.  The patient verbalized understanding of instructions, educational materials, and care plan provided today and agreed to receive a mailed copy of patient instructions, educational materials, and care plan.   The patient has been provided with contact information for the care management team and has been advised to call with any health related questions or concerns.   Valente David, RN, MSN, [place] [facility] Care Management Coordinator [phone]

## 2022-06-03 NOTE — Patient Outreach (Signed)
  Care Coordination   Initial Visit Note   06/03/2022 Name: Amy Sawyer MRN: [id number] DOB: [birth year]  Amy Sawyer is a 88 y.o. year old female who sees Amy Noble, MD for primary care. I spoke with Amy Sawyer, daughter of Amy Sawyer by phone today.  What matters to the patients health and wellness today?  Use of medications for dementia.  State palliative care nurse does not feel they are working, they will follow up with PCP.  Palliative care home visits occur monthly.  Denies any urgent concerns, encouraged to contact this care manager with questions.      Goals Addressed             This Visit's Progress    COMPLETED: Care Coordination Activities - No follow up needed       Care Coordination Interventions: Patient interviewed about adult health maintenance status including  Falls risk assessment    Regular eye checkups Regular Dental Care    Blood Pressure    Advised patient to discuss  Pneumonia Vaccine Influenza Vaccine COVID vaccination    with primary care provider  Provided education about Overall health maintenance SDOH assessment completed Next PCP appointment reviewed (December)         SDOH assessments and interventions completed:  Yes  SDOH Interventions Today    Flowsheet Row Most Recent Value  SDOH Interventions   Food Insecurity Interventions Intervention Not Indicated  Housing Interventions Intervention Not Indicated  Transportation Interventions Intervention Not Indicated  Utilities Interventions Intervention Not Indicated        Care Coordination Interventions Activated:  Yes  Care Coordination Interventions:  Yes, provided   Follow up plan: No further intervention required.   Encounter Outcome:  Pt. Visit Completed   Valente David, RN, MSN, [place] Care Management Care Management Coordinator [phone]

## 2022-06-22 IMAGING — DX DG FOREARM 2V*R*
3 series · 3 of 3 positions shown · non-contrast
Comparison: None.

CLINICAL DATA: Recent fall with forearm and wrist pain, initial
encounter

EXAM:
RIGHT FOREARM - 2 VIEW

[forearm ap (1 of 2)]
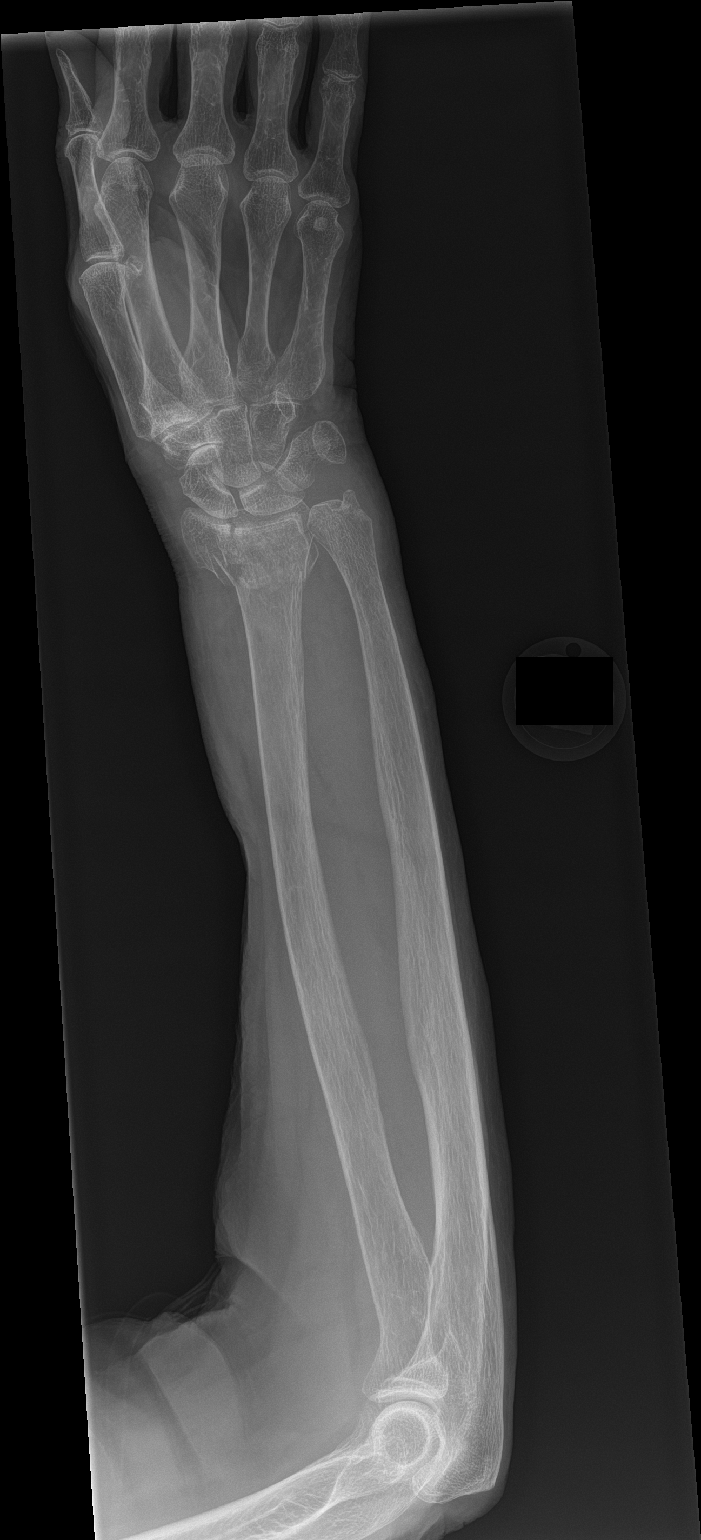

[forearm lat]
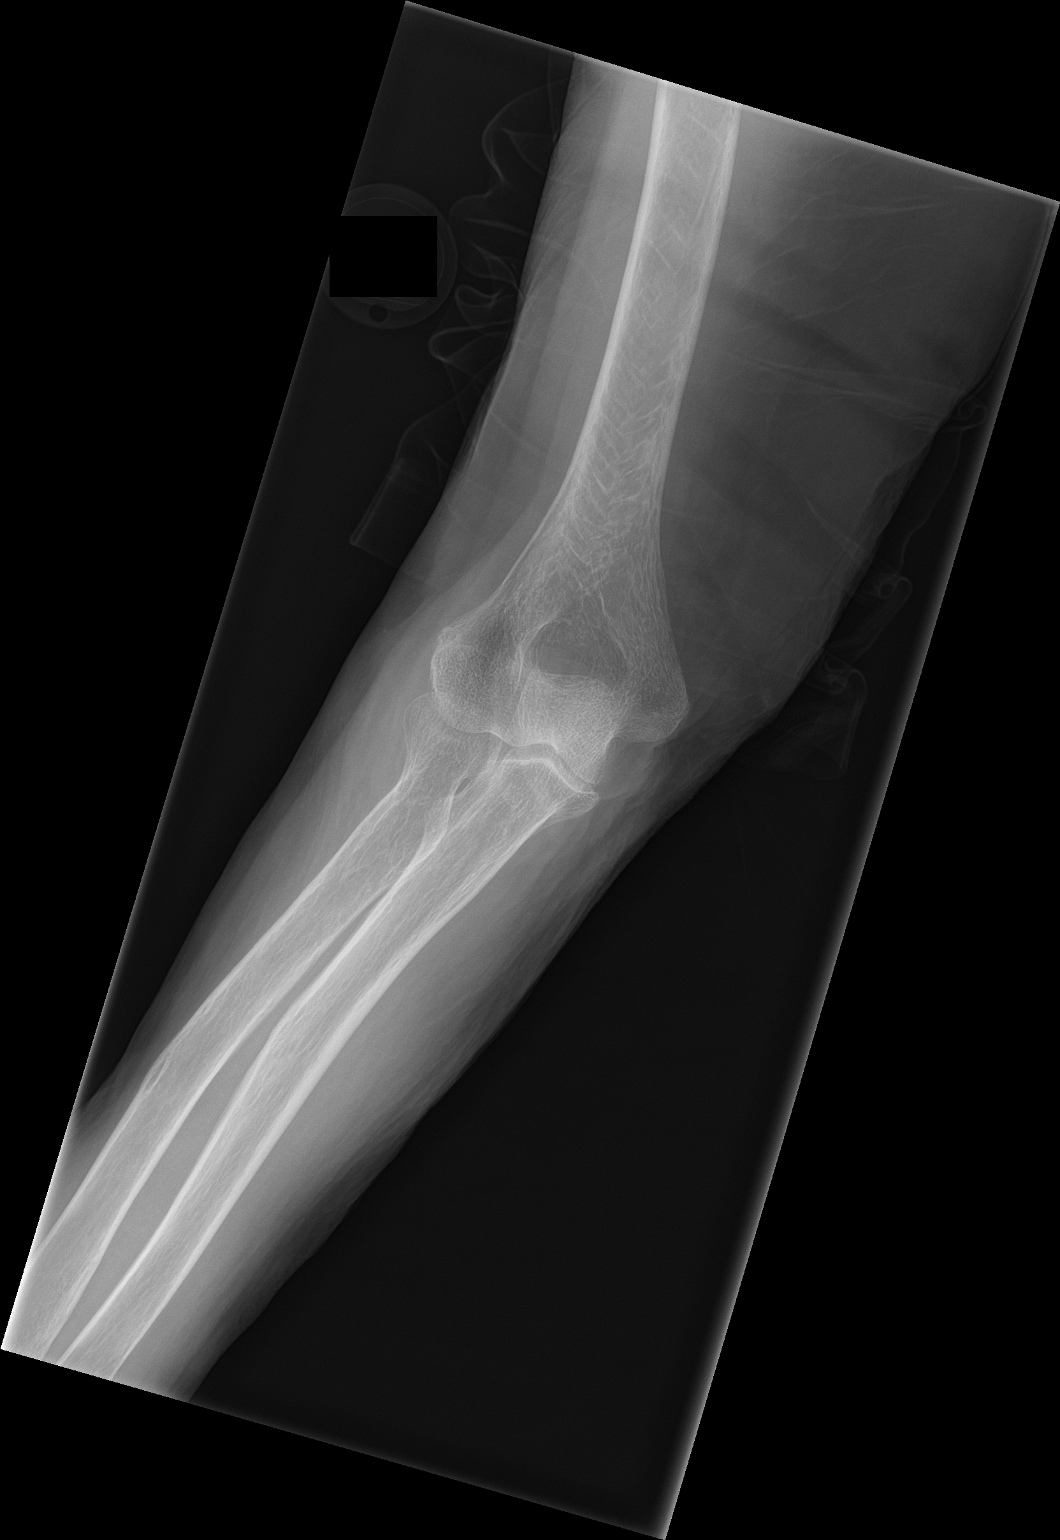

[forearm ap (2 of 2)]
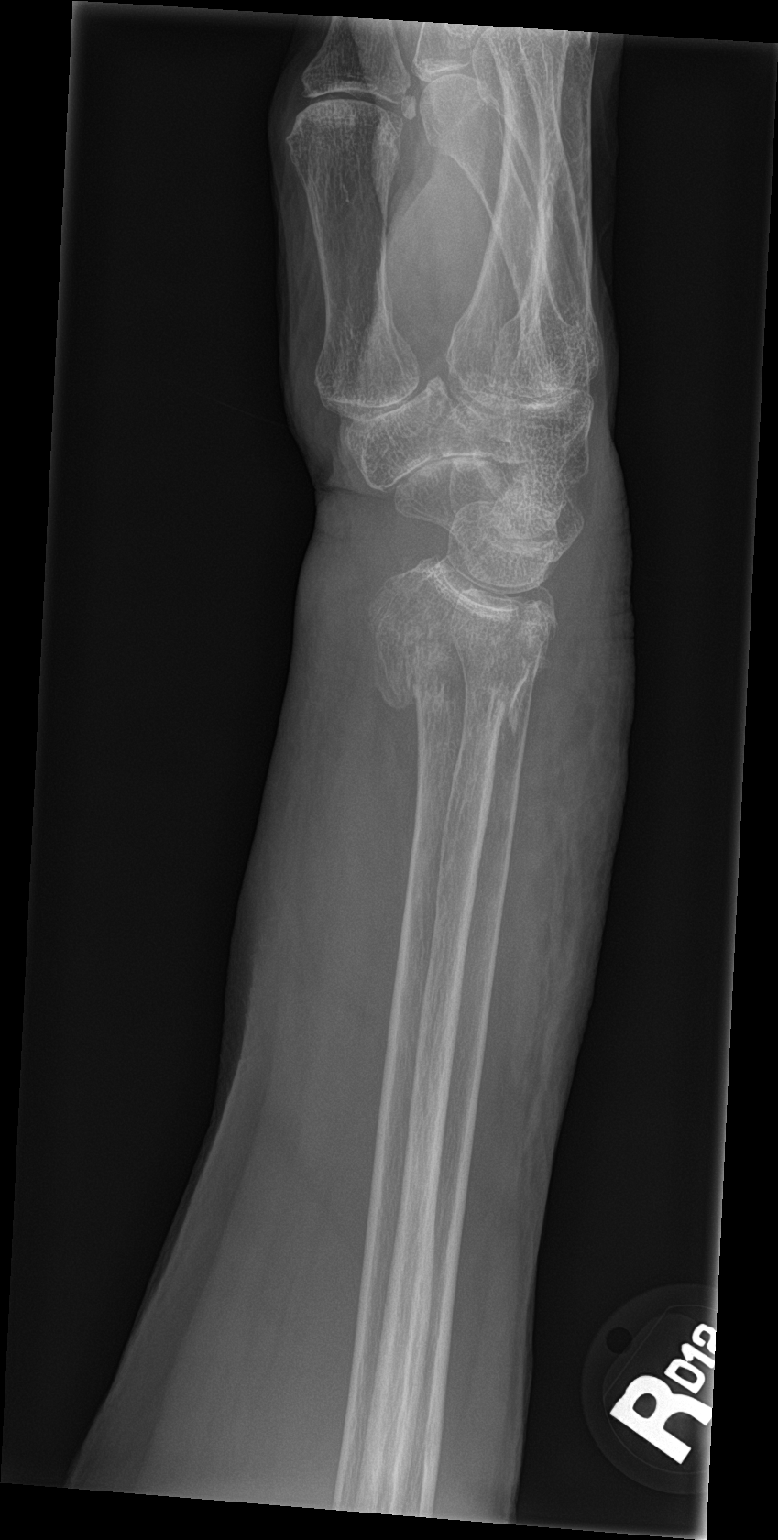

[3 of 3 positions shown; findings below may reference images not displayed]

FINDINGS: Comminuted distal radial fracture is noted with impaction and
posterior angulation at the fracture site. Extension to the
articular surface is noted. Soft tissue swelling is noted.
IMPRESSION: Comminuted distal radial fracture with articular involvement.

## 2022-07-04 DIAGNOSIS — F03B18 Unspecified dementia, moderate, with other behavioral disturbance: Secondary | ICD-10-CM | POA: Diagnosis not present

## 2022-07-04 DIAGNOSIS — Z681 Body mass index (BMI) 19 or less, adult: Secondary | ICD-10-CM | POA: Diagnosis not present

## 2022-07-04 DIAGNOSIS — Z515 Encounter for palliative care: Secondary | ICD-10-CM | POA: Diagnosis not present

## 2022-07-06 DIAGNOSIS — 419620001 Death: Secondary | SNOMED CT | POA: Diagnosis not present

## 2022-07-06 DEATH — deceased

## 2022-07-07 ENCOUNTER — Encounter (HOSPITAL_COMMUNITY): Payer: Self-pay

## 2022-07-07 ENCOUNTER — Observation Stay (HOSPITAL_COMMUNITY)
Admission: EM | Admit: 2022-07-07 | Discharge: 2022-07-08 | Disposition: A | Discharge status: Hospice | Payer: Medicare Other | Attending: Family Medicine | Admitting: Family Medicine

## 2022-07-07 ENCOUNTER — Emergency Department (HOSPITAL_COMMUNITY): Payer: Medicare Other

## 2022-07-07 ENCOUNTER — Other Ambulatory Visit: Payer: Self-pay

## 2022-07-07 ENCOUNTER — Encounter (HOSPITAL_COMMUNITY): Payer: Self-pay | Admitting: *Deleted

## 2022-07-07 DIAGNOSIS — Z79899 Other long term (current) drug therapy: Secondary | ICD-10-CM | POA: Insufficient documentation

## 2022-07-07 DIAGNOSIS — Z7982 Long term (current) use of aspirin: Secondary | ICD-10-CM | POA: Diagnosis not present

## 2022-07-07 DIAGNOSIS — S01112A Laceration without foreign body of left eyelid and periocular area, initial encounter: Secondary | ICD-10-CM | POA: Insufficient documentation

## 2022-07-07 DIAGNOSIS — R636 Underweight: Secondary | ICD-10-CM | POA: Diagnosis not present

## 2022-07-07 DIAGNOSIS — Y9301 Activity, walking, marching and hiking: Secondary | ICD-10-CM | POA: Insufficient documentation

## 2022-07-07 DIAGNOSIS — S066X0A Traumatic subarachnoid hemorrhage without loss of consciousness, initial encounter: Principal | ICD-10-CM | POA: Insufficient documentation

## 2022-07-07 DIAGNOSIS — M85862 Other specified disorders of bone density and structure, left lower leg: Secondary | ICD-10-CM | POA: Diagnosis not present

## 2022-07-07 DIAGNOSIS — F0390 Unspecified dementia without behavioral disturbance: Secondary | ICD-10-CM | POA: Insufficient documentation

## 2022-07-07 DIAGNOSIS — W01198A Fall on same level from slipping, tripping and stumbling with subsequent striking against other object, initial encounter: Secondary | ICD-10-CM | POA: Diagnosis not present

## 2022-07-07 DIAGNOSIS — M7989 Other specified soft tissue disorders: Secondary | ICD-10-CM | POA: Diagnosis not present

## 2022-07-07 DIAGNOSIS — R918 Other nonspecific abnormal finding of lung field: Secondary | ICD-10-CM

## 2022-07-07 DIAGNOSIS — M1712 Unilateral primary osteoarthritis, left knee: Secondary | ICD-10-CM | POA: Diagnosis not present

## 2022-07-07 DIAGNOSIS — I77810 Thoracic aortic ectasia: Secondary | ICD-10-CM | POA: Diagnosis not present

## 2022-07-07 DIAGNOSIS — I10 Essential (primary) hypertension: Secondary | ICD-10-CM | POA: Diagnosis not present

## 2022-07-07 DIAGNOSIS — M1612 Unilateral primary osteoarthritis, left hip: Secondary | ICD-10-CM | POA: Diagnosis not present

## 2022-07-07 DIAGNOSIS — R911 Solitary pulmonary nodule: Secondary | ICD-10-CM | POA: Diagnosis not present

## 2022-07-07 DIAGNOSIS — Y92009 Unspecified place in unspecified non-institutional (private) residence as the place of occurrence of the external cause: Secondary | ICD-10-CM | POA: Diagnosis not present

## 2022-07-07 DIAGNOSIS — E876 Hypokalemia: Secondary | ICD-10-CM | POA: Insufficient documentation

## 2022-07-07 DIAGNOSIS — M4312 Spondylolisthesis, cervical region: Secondary | ICD-10-CM | POA: Diagnosis not present

## 2022-07-07 DIAGNOSIS — S0240DA Maxillary fracture, left side, initial encounter for closed fracture: Secondary | ICD-10-CM | POA: Diagnosis not present

## 2022-07-07 DIAGNOSIS — I609 Nontraumatic subarachnoid hemorrhage, unspecified: Secondary | ICD-10-CM | POA: Diagnosis not present

## 2022-07-07 DIAGNOSIS — S72002A Fracture of unspecified part of neck of left femur, initial encounter for closed fracture: Secondary | ICD-10-CM | POA: Diagnosis not present

## 2022-07-07 DIAGNOSIS — R627 Adult failure to thrive: Secondary | ICD-10-CM | POA: Diagnosis not present

## 2022-07-07 DIAGNOSIS — I700 Atherosclerosis of aorta: Secondary | ICD-10-CM | POA: Diagnosis not present

## 2022-07-07 DIAGNOSIS — S020XXA Fracture of vault of skull, initial encounter for closed fracture: Secondary | ICD-10-CM | POA: Diagnosis not present

## 2022-07-07 DIAGNOSIS — Z8673 Personal history of transient ischemic attack (TIA), and cerebral infarction without residual deficits: Secondary | ICD-10-CM | POA: Insufficient documentation

## 2022-07-07 DIAGNOSIS — S0282XA Fracture of other specified skull and facial bones, left side, initial encounter for closed fracture: Secondary | ICD-10-CM | POA: Diagnosis not present

## 2022-07-07 DIAGNOSIS — E43 Unspecified severe protein-calorie malnutrition: Secondary | ICD-10-CM | POA: Insufficient documentation

## 2022-07-07 DIAGNOSIS — S0285XA Fracture of orbit, unspecified, initial encounter for closed fracture: Secondary | ICD-10-CM | POA: Diagnosis not present

## 2022-07-07 DIAGNOSIS — S8012XA Contusion of left lower leg, initial encounter: Secondary | ICD-10-CM | POA: Diagnosis not present

## 2022-07-07 DIAGNOSIS — W19XXXA Unspecified fall, initial encounter: Secondary | ICD-10-CM

## 2022-07-07 DIAGNOSIS — F32A Depression, unspecified: Secondary | ICD-10-CM | POA: Insufficient documentation

## 2022-07-07 DIAGNOSIS — S0181XA Laceration without foreign body of other part of head, initial encounter: Secondary | ICD-10-CM | POA: Insufficient documentation

## 2022-07-07 DIAGNOSIS — Z9889 Other specified postprocedural states: Secondary | ICD-10-CM | POA: Diagnosis not present

## 2022-07-07 DIAGNOSIS — S0292XA Unspecified fracture of facial bones, initial encounter for closed fracture: Secondary | ICD-10-CM | POA: Diagnosis not present

## 2022-07-07 DIAGNOSIS — S20219A Contusion of unspecified front wall of thorax, initial encounter: Secondary | ICD-10-CM | POA: Diagnosis not present

## 2022-07-07 DIAGNOSIS — J439 Emphysema, unspecified: Secondary | ICD-10-CM | POA: Diagnosis not present

## 2022-07-07 LAB — CBC
HCT: 37.2 % (ref 36.0–46.0)
Hemoglobin: 12 g/dL (ref 12.0–15.0)
MCH: 30.1 pg (ref 26.0–34.0)
MCHC: 32.3 g/dL (ref 30.0–36.0)
MCV: 93.2 fL (ref 80.0–100.0)
Platelets: 209 10*3/uL (ref 150–400)
RBC: 3.99 MIL/uL (ref 3.87–5.11)
RDW: 14.5 % (ref 11.5–15.5)
WBC: 7.6 10*3/uL (ref 4.0–10.5)
nRBC: 0 % (ref 0.0–0.2)

## 2022-07-07 LAB — BASIC METABOLIC PANEL
Anion gap: 5 (ref 5–15)
BUN: 17 mg/dL (ref 8–23)
CO2: 32 mmol/L (ref 22–32)
Calcium: 8.9 mg/dL (ref 8.9–10.3)
Chloride: 102 mmol/L (ref 98–111)
Creatinine, Ser: 0.92 mg/dL (ref 0.44–1.00)
GFR, Estimated: 60 mL/min — ABNORMAL LOW (ref >60)
Glucose, Bld: 114 mg/dL — ABNORMAL HIGH (ref 70–99)
Potassium: 3.2 mmol/L — ABNORMAL LOW (ref 3.5–5.1)
Sodium: 139 mmol/L (ref 135–145)

## 2022-07-07 MED ORDER — LISINOPRIL 10 MG PO TABS
40.0000 mg | ORAL_TABLET | Freq: Every day | ORAL | Status: DC
  Filled 2022-07-07: qty 4

## 2022-07-07 MED ORDER — MORPHINE SULFATE (PF) 2 MG/ML IV SOLN
2.0000 mg | Freq: Once | INTRAVENOUS | Status: AC
  Administered 2022-07-07: 2 mg via INTRAVENOUS
  Filled 2022-07-07: qty 1

## 2022-07-07 MED ORDER — DONEPEZIL HCL 5 MG PO TABS
5.0000 mg | ORAL_TABLET | Freq: Every day | ORAL | Status: DC
  Filled 2022-07-07: qty 1

## 2022-07-07 MED ORDER — SERTRALINE HCL 50 MG PO TABS
100.0000 mg | ORAL_TABLET | Freq: Every day | ORAL | Status: DC
  Filled 2022-07-07: qty 2

## 2022-07-07 MED ORDER — ACETAMINOPHEN 325 MG PO TABS: 650.0000 mg | ORAL_TABLET | Freq: Four times a day (QID) | ORAL | Status: DC | PRN

## 2022-07-07 MED ORDER — ADULT MULTIVITAMIN W/MINERALS CH
1.0000 | ORAL_TABLET | Freq: Every day | ORAL | Status: DC
  Filled 2022-07-07: qty 1

## 2022-07-07 MED ORDER — ACETAMINOPHEN 650 MG RE SUPP: 650.0000 mg | Freq: Four times a day (QID) | RECTAL | Status: DC | PRN

## 2022-07-07 MED ORDER — ASPIRIN 81 MG PO TBEC
81.0000 mg | DELAYED_RELEASE_TABLET | Freq: Every day | ORAL | Status: DC
  Filled 2022-07-07: qty 1

## 2022-07-07 MED ORDER — SODIUM CHLORIDE 0.9 % IV BOLUS: 500.0000 mL | Freq: Once | INTRAVENOUS | Status: DC

## 2022-07-07 NOTE — ED Notes (Signed)
Patient transported to CT 

## 2022-07-07 NOTE — ED Provider Notes (Signed)
[facility] EMERGENCY DEPARTMENT Provider Note   CSN: [id number] Arrival date & time: 07/07/22  1622     History  Chief Complaint  Patient presents with   Amy Sawyer is a 88 y.o. female.   Fall     Patient has a history of hyperlipidemia, hypertension, stroke, difficulty hearing, prior proximal humerus and left hip fracture.  Patient lives at home.  Hospice and palliative care is assisting with her care at home.  Patient is weak and has to use a walker.  Patient was walking today when she accidentally tripped and fell striking her left head.  Patient sustained a small laceration to her left temple area.  She is also complaining of pain in her left hand as well as her left leg.  Daughter states she had not been sick recently.  No complaints of coughing.  No vomiting or diarrhea.  Home Medications Prior to Admission medications   Medication Sig Start Date End Date Taking? Authorizing Provider  acetaminophen (TYLENOL) 500 MG tablet Take 500 mg by mouth every 6 (six) hours as needed for mild pain or headache.   Yes [provider]  alendronate (FOSAMAX) 70 MG tablet Take 70 mg by mouth once a week. 06/15/22  Yes [provider]  aspirin EC 81 MG tablet Take 81 mg by mouth daily. Swallow whole.   Yes [provider]  clonazePAM (KLONOPIN) 0.5 MG tablet Take 0.5-1 mg by mouth daily. 06/09/22  Yes [provider]  dicyclomine (BENTYL) 10 MG capsule Take 10 mg by mouth 3 (three) times daily. 05/27/22  Yes [provider]  donepezil (ARICEPT) 5 MG tablet Take 5 mg by mouth daily. 08/31/21  Yes [provider]  feeding supplement (ENSURE ENLIVE / ENSURE PLUS) LIQD Take 237 mLs by mouth 3 (three) times daily with meals. 01/21/21  Yes Shah, Pratik D, DO  lisinopril (ZESTRIL) 40 MG tablet Take 1 tablet (40 mg total) by mouth daily. (FORMULARY SUB FOR RAMIPRIL 10MG ) 02/23/21  Yes Gerlene Fee, NP  Multiple Vitamin (MULTIVITAMIN)  tablet Take 1 tablet by mouth daily.   Yes [provider]  oxybutynin (DITROPAN) 5 MG tablet Take 5 mg by mouth at bedtime. 06/27/22  Yes [provider]  sertraline (ZOLOFT) 100 MG tablet Take 100 mg by mouth daily. 06/29/22  Yes [provider]  trolamine salicylate (ASPERCREME) 10 % cream Apply 1 application topically 2 (two) times daily as needed for muscle pain.   Yes [provider]      Allergies    Demerol [meperidine] and Hydrocodone-acetaminophen    Review of Systems   Review of Systems  Physical Exam Updated Vital Signs BP (!) 178/92   Pulse 82   Temp 98.1 F (36.7 C) (Oral)   Resp 15   Ht 1.524 m (5')   Wt 39.5 kg   SpO2 95%   BMI 16.99 kg/m  Physical Exam Vitals and nursing note reviewed.  Constitutional:      Comments: Elderly, frail  HENT:     Head: Normocephalic.     Comments: Superficial laceration lateral to the left eyebrow, small amount of bleeding, no pulsatile flow    Right Ear: External ear normal.     Left Ear: External ear normal.  Eyes:     General: No scleral icterus.       Right eye: No discharge.        Left eye: No discharge.  Conjunctiva/sclera: Conjunctivae normal.  Neck:     Trachea: No tracheal deviation.  Cardiovascular:     Rate and Rhythm: Normal rate and regular rhythm.  Pulmonary:     Effort: Pulmonary effort is normal. No respiratory distress.     Breath sounds: Normal breath sounds. No stridor. No wheezing or rales.  Abdominal:     General: Bowel sounds are normal. There is no distension.     Palpations: Abdomen is soft.     Tenderness: There is no abdominal tenderness. There is no guarding or rebound.  Musculoskeletal:        General: No deformity.     Left hand: Laceration and tenderness present.     Cervical back: Normal and neck supple.     Thoracic back: Normal.     Lumbar back: Normal.     Left hip: Tenderness present.     Left lower leg: Tenderness present.  Skin:     General: Skin is warm and dry.     Findings: No rash.  Neurological:     General: No focal deficit present.     Mental Status: She is alert.     Cranial Nerves: No cranial nerve deficit (no facial droop, extraocular movements intact, no slurred speech).     Sensory: No sensory deficit.     Motor: No abnormal muscle tone or seizure activity.     Coordination: Coordination normal.  Psychiatric:        Mood and Affect: Mood normal.     ED Results / Procedures / Treatments   Labs (all labs ordered are listed, but only abnormal results are displayed) Labs Reviewed  BASIC METABOLIC PANEL - Abnormal; Notable for the following components:      Result Value   Potassium 3.2 (*)    Glucose, Bld 114 (*)    GFR, Estimated 60 (*)    All other components within normal limits  CBC    EKG None  Radiology CT Cervical Spine Wo Contrast  Result Date: 07/07/2022 CLINICAL DATA:  Cervical spine CT 06/07/2004 EXAM: CT CERVICAL SPINE WITHOUT CONTRAST TECHNIQUE: Multidetector CT imaging of the cervical spine was performed without intravenous contrast. Multiplanar CT image reconstructions were also generated. RADIATION DOSE REDUCTION: This exam was performed according to the departmental dose-optimization program which includes automated exposure control, adjustment of the mA and/or kV according to patient size and/or use of iterative reconstruction technique. COMPARISON:  None Available. FINDINGS: Alignment: There is degenerative grade 1 anterolisthesis at C4-C5, C7-T1, and T1-T2. Skull base and vertebrae: There is no acute cervical spine fracture. No aggressive osseous lesion. Soft tissues and spinal canal: No prevertebral soft tissue swelling. Disc levels: There is multilevel degenerative disc disease, severe at C4-C5. There is multilevel facet arthropathy bilaterally, severe on the left at C3-C4 and C4-C5. There is bony fusion of the right-sided C7-T1 facets into a lesser degree on the left. Upper chest:  There is a partially visualized mass-like consolidation in the medial left apex measuring approximately 4.3 x 2.6 x 3.1 cm (series 3, image 72, series 6, image 38). There is centrilobular emphysema and interlobular septal thickening. Other: None. IMPRESSION: No acute cervical spine fracture. Multilevel degenerative disc disease of set arthropathy. Mass-like consolidation in the medial left apex measuring 4.3 x 2.6 x 3.1 cm, partially visualized. Recommend chest CT. Electronically Signed   By: Maurine Simmering M.D.   On: 07/07/2022 18:36   CT Head Wo Contrast  Result Date: 07/07/2022 CLINICAL DATA:  Head trauma. EXAM:  CT HEAD WITHOUT CONTRAST TECHNIQUE: Contiguous axial images were obtained from the base of the skull through the vertex without intravenous contrast. RADIATION DOSE REDUCTION: This exam was performed according to the departmental dose-optimization program which includes automated exposure control, adjustment of the mA and/or kV according to patient size and/or use of iterative reconstruction technique. COMPARISON:  CT head 07/17/2020. FINDINGS: Brain: There is a small amount of acute subarachnoid hemorrhage in the anterior frontal lobes, right greater than left, and left insular region. There is no surrounding edema or mass effect. No extra-axial fluid collection or midline shift. No hydrocephalus. Small old left frontoparietal cortical infarct is unchanged. Gray-white matter distinction is otherwise preserved. There is stable mild diffuse atrophy and mild periventricular white matter hypodensity, likely chronic small vessel ischemic change. Vascular: Atherosclerotic calcifications are present within the cavernous internal carotid arteries. Skull: There is acute fracture of the left inferior orbital wall the adjacent postseptal orbital hemorrhage and air. Fracture appears nondisplaced. There is also acute nondisplaced fracture of the lateral wall of the left orbit. There also acute fractures of the  anterior inferior and lateral left maxillary sinus walls. Sinuses/Orbits: There is hemorrhage in the left maxillary and left sphenoid sinus. Other: Globes appear intact bilaterally. There is preseptal left orbital soft tissue swelling with hematoma. IMPRESSION: 1. Small amount of acute subarachnoid hemorrhage in the anterior frontal lobes, right greater than left, and left insular region. No surrounding edema or mass effect. 2. Acute fractures of the left inferior and lateral orbital walls and left maxillary sinus walls. 3. Preseptal left orbital soft tissue swelling with hematoma. Electronically Signed   By: Ronney Asters M.D.   On: 07/07/2022 18:32   DG Hand Complete Left  Result Date: 07/07/2022 CLINICAL DATA:  Bruising and swelling after fall EXAM: LEFT HAND - COMPLETE 3+ VIEW COMPARISON:  None Available. FINDINGS: Frontal, oblique, lateral views of the left hand are obtained. The bones are diffusely osteopenic. Mild diffuse interphalangeal joint space narrowing consistent with osteoarthritis crotch that there is multifocal osteoarthritis, with joint space narrowing throughout the interphalangeal and metacarpophalangeal joints, as well as within the radial aspect of the carpus. No acute fracture, subluxation, or dislocation. Soft tissues are unremarkable. IMPRESSION: 1. Diffuse osteopenia.  No acute displaced fracture. 2. Multifocal osteoarthritis. Electronically Signed   By: Randa Ngo M.D.   On: 07/07/2022 17:55   DG Tibia/Fibula Left  Result Date: 07/07/2022 CLINICAL DATA:  Tripped and fell, bruising EXAM: LEFT TIBIA AND FIBULA - 2 VIEW COMPARISON:  None Available. FINDINGS: Frontal and lateral views of the left tibia and fibula are obtained. Bones are diffusely osteopenic. There are no acute displaced fractures. Alignment of the left knee and ankle is anatomic. Mild left knee and ankle osteoarthritis. Soft tissues are unremarkable. IMPRESSION: 1. Diffuse osteopenia.  No acute fracture.  Electronically Signed   By: Randa Ngo M.D.   On: 07/07/2022 17:54   DG Hip Unilat W or Wo Pelvis 2-3 Views Left  Result Date: 07/07/2022 CLINICAL DATA:  [place], pain EXAM: DG HIP (WITH OR WITHOUT PELVIS) 2-3V LEFT COMPARISON:  05/31/2021 FINDINGS: Frontal view of the pelvis as well as frontal and cross-table lateral views of the left hip are obtained. Postsurgical changes are seen from prior ORIF of the left femur. The distal margins of the femoral intramedullary rod are excluded by collimation. I do not see any evidence of acute displaced fracture. No right hip fractures. Stable mild bilateral hip osteoarthritis. The bones are diffusely osteopenic. The remainder of  the bony pelvis is unremarkable. IMPRESSION: 1. Sequela from chronic left hip fracture status post ORIF. No evidence of superimposed acute injury. 2. Diffuse osteopenia.  No acute displaced pelvic fracture. Electronically Signed   By: Randa Ngo M.D.   On: 07/07/2022 17:53   DG Chest Portable 1 View  Result Date: 07/07/2022 CLINICAL DATA:  Tripped and fell, bruising EXAM: PORTABLE CHEST 1 VIEW COMPARISON:  01/19/2021 FINDINGS: Single frontal view of the chest was obtained with the patient rotated toward the right. The cardiac silhouette is unremarkable. Stable ectasia and atherosclerosis of the thoracic aorta. Diffuse increased interstitial prominence most consistent with chronic scarring. No acute airspace disease, effusion, or pneumothorax. There are no acute displaced fractures. IMPRESSION: 1. Stable chest, no acute process. Electronically Signed   By: Randa Ngo M.D.   On: 07/07/2022 17:51    Procedures Procedures    Medications Ordered in ED Medications  morphine (PF) 2 MG/ML injection 2 mg (2 mg Intravenous Given 07/07/22 2003)    ED Course/ Medical Decision Making/ A&P Clinical Course as of 07/08/22 1506  Thu Jul 07, 2022  1903 CBC CBC normal [JK]  9518 Basic metabolic panel(!) Mildly decreased potassium [JK]   1903 X-ray with concerning mass in the left upper lung noted on C-spine CT [JK]  1905 CT shows subarachnoid hemorrhage as well as facial bone fractures [JK]  1905 Hip hand and tib-fib x-rays without acute findings [JK]  1952 I reviewed the case with Dr. Carole Civil neurosurgery.  He recommends repeating a CT scan in 8 hours.  Patient is appropriate to be observed overnight here at [facility] [JK]  2030 Case discussed with Dr. Josephine Cables regarding admission [JK]    Clinical Course User Index [JK] Dorie Rank, MD                           Medical Decision Making Problems Addressed: Closed fracture of facial bone, unspecified facial bone, initial encounter [facility]): acute illness or injury that poses a threat to life or bodily functions Facial laceration, initial encounter: acute illness or injury Subarachnoid hemorrhage [facility]): acute illness or injury that poses a threat to life or bodily functions  Amount and/or Complexity of Data Reviewed Labs: ordered. Decision-making details documented in ED Course. Radiology: ordered and independent interpretation performed.  Risk Prescription drug management. Decision regarding hospitalization.   Patient presented to the ED for evaluation after mechanical fall.  Patient noted to have facial contusion and superficial laceration.  Patient was repaired with Dermabond.  CT scans unfortunately show subarachnoid hemorrhage as well as facial bone fractures.  Her facial bone fractures are nondisplaced.  Will require follow-up with ENT as outpatient.  Discussed the case with neurosurgery Dr. Glenford Peers.  He recommends repeat CT scan in 8 hours.  Patient can be observed here.  No indication for operative intervention at this time  Discussed with Dr Josephine Cables regarding admission       Final Clinical Impression(s) / ED Diagnoses Final diagnoses:  Subarachnoid hemorrhage ([place])  Closed fracture of facial bone, unspecified facial bone, initial encounter [facility])   Facial laceration, initial encounter    Rx / DC Orders ED Discharge Orders     None         Dorie Rank, MD 07/08/22 [phone]

## 2022-07-07 NOTE — ED Notes (Signed)
Roselyn Reef, RN from upstairs transporting pt to admission bed at this time.

## 2022-07-07 NOTE — ED Triage Notes (Signed)
Pt brought in by [facility] for of fall; pt tripped on the floor and fell; pt has bruising and swelling to left side of head; pt has nose bleed; no loc; pt is has dementia  BP 190/90 Cbg 117  Pt is HOH and does not know what happened

## 2022-07-07 NOTE — ED Notes (Signed)
Dermabond applied by Dr Hillard Danker

## 2022-07-07 NOTE — H&P (Addendum)
History and Physical    Patient: Amy Sawyer CWC:[id number] DOB: Jun 14, 1931 DOA: 07/07/2022 DOS: the patient was seen and examined on 07/07/2022 PCP: Asencion Noble, MD  Patient coming from: Home  Chief Complaint:  Chief Complaint  Patient presents with   Fall   HPI: Amy Sawyer is a 88 y.o. female with medical history significant of hypertension, HOH, dementia, depression who presents to the emergency department after sustaining a fall at home.  Patient ambulates with a walker and while walking today, she accidentally tripped and fell landing on the left side of head where she sustained laceration to her left temple area.  She complained of left hand and left leg pain.  Daughter at bedside endorsed that patient has not been sick recently.  She denies chest pain, shortness of breath fever, chills, nausea, vomiting, diarrhea or constipation.  ED Course:  In the emergency department, she was hemodynamically stable, though BP on arrival was elevated at 187/113.  Work-up in the ED showed normal CBC, BMP was normal except for potassium of 3.2 and blood glucose of 114. CT head without contrast showed: 1. Small amount of acute subarachnoid hemorrhage in the anterior frontal lobes, right greater than left, and left insular region. No surrounding edema or mass effect. 2. Acute fractures of the left inferior and lateral orbital walls and left maxillary sinus walls. 3. Preseptal left orbital soft tissue swelling with hematoma.  CT cervical spine without contrast showed no acute cervical spine fracture.  Multilevel degenerative disc disease, facet arthropathy. Mass-like consolidation in the medial left apex measuring 4.3 x 2.6 x 3.1 cm, partially visualized.  CT chest without contrast showed 3.4 nodular density in the left upper lobe abutting the mediastinum.  Findings highly suspicious for neoplasm.  PET/CT or tissue sampling recommended Left hand x-ray, left tibia/fibula, left hip showed no  fracture Chest x-ray showed no acute process IV morphine 2 mg x 1 was given.  Neurosurgery (Dr. Glenford Peers) was consulted and recommended repeat CT scan 8 hours after prior CT and the patient can be admitted here at [facility] since no indication for operative intervention at this time.  Review of Systems: Review of systems as noted in the HPI. All other systems reviewed and are negative.   Past Medical History:  Diagnosis Date   HOH (hard of hearing)    HTN (hypertension)    Hyperlipemia    Proximal humerus fracture 03/13/2012   doi 03-13-2012 left     Stroke [facility])    Past Surgical History:  Procedure Laterality Date   BREAST SURGERY     INTRAMEDULLARY (IM) NAIL INTERTROCHANTERIC Left 01/20/2021   Procedure: OPERATIVE FIXATION OF LEFT HIP FRACTURE;  Surgeon: Mordecai Rasmussen, MD;  Location: [facility];  Service: Orthopedics;  Laterality: Left;   LEG SURGERY      Social History:  reports that she has never smoked. She has never used smokeless tobacco. She reports that she does not drink alcohol and does not use drugs.   Allergies  Allergen Reactions   Demerol [Meperidine] Anaphylaxis   Hydrocodone-Acetaminophen Nausea And Vomiting    Family History  Problem Relation Age of Onset   Heart disease Other    Diabetes Other    Stroke Mother      Prior to Admission medications   Medication Sig Start Date End Date Taking? Authorizing Provider  acetaminophen (TYLENOL) 500 MG tablet Take 500 mg by mouth every 6 (six) hours as needed for mild pain or headache.   Yes  [provider]  alendronate (FOSAMAX) 70 MG tablet Take 70 mg by mouth once a week. 06/15/22  Yes [provider]  aspirin EC 81 MG tablet Take 81 mg by mouth daily. Swallow whole.   Yes [provider]  clonazePAM (KLONOPIN) 0.5 MG tablet Take 0.5-1 mg by mouth daily. 06/09/22  Yes [provider]  dicyclomine (BENTYL) 10 MG capsule Take 10 mg by mouth 3 (three) times daily. 05/27/22  Yes [provider]  donepezil (ARICEPT) 5 MG tablet Take 5 mg by mouth daily. 08/31/21  Yes [provider]  feeding supplement (ENSURE ENLIVE / ENSURE PLUS) LIQD Take 237 mLs by mouth 3 (three) times daily with meals. 01/21/21  Yes Shah, Pratik D, DO  lisinopril (ZESTRIL) 40 MG tablet Take 1 tablet (40 mg total) by mouth daily. (FORMULARY SUB FOR RAMIPRIL 10MG ) 02/23/21  Yes Gerlene Fee, NP  Multiple Vitamin (MULTIVITAMIN) tablet Take 1 tablet by mouth daily.   Yes [provider]  oxybutynin (DITROPAN) 5 MG tablet Take 5 mg by mouth at bedtime. 06/27/22  Yes [provider]  sertraline (ZOLOFT) 100 MG tablet Take 100 mg by mouth daily. 06/29/22  Yes [provider]  trolamine salicylate (ASPERCREME) 10 % cream Apply 1 application topically 2 (two) times daily as needed for muscle pain.   Yes [provider]    Physical Exam: BP (!) 194/95 (BP Location: Right Arm)   Pulse 92   Temp (!) 97.3 F (36.3 C)   Resp (!) 2   Ht 5' (1.524 m)   Wt 39.5 kg   SpO2 100%   BMI 16.99 kg/m   General: 88 y.o. year-old elderly frail female but in no acute distress.  Alert and oriented x3. HEENT: EOMI, superficial laceration on lateral side of left eyebrow, periorbital hematoma Neck: Supple, trachea medial Cardiovascular: Regular rate and rhythm with no rubs or gallops.  No thyromegaly or JVD noted.  No lower extremity edema. 2/4 pulses in all 4 extremities. Respiratory: Clear to auscultation with no wheezes or rales. Good inspiratory effort. Abdomen: Soft, nontender nondistended with normal bowel sounds x4 quadrants. Muskuloskeletal: Noted laceration of the left hand.  Tender to palpation.   Neuro: CN II-XII intact,  sensation, reflexes intact Skin: No ulcerative lesions noted or rashes Psychiatry: Mood is appropriate for condition and setting          Labs on Admission:  Basic Metabolic Panel: Recent Labs  Lab 07/07/22 1756  NA 139  K 3.2*  CL 102   CO2 32  GLUCOSE 114*  BUN 17  CREATININE 0.92  CALCIUM 8.9   Liver Function Tests: No results for input(s): "AST", "ALT", "ALKPHOS", "BILITOT", "PROT", "ALBUMIN" in the last 168 hours. No results for input(s): "LIPASE", "AMYLASE" in the last 168 hours. No results for input(s): "AMMONIA" in the last 168 hours. CBC: Recent Labs  Lab 07/07/22 1756  WBC 7.6  HGB 12.0  HCT 37.2  MCV 93.2  PLT 209   Cardiac Enzymes: No results for input(s): "CKTOTAL", "CKMB", "CKMBINDEX", "TROPONINI" in the last 168 hours.  BNP (last 3 results) No results for input(s): "BNP" in the last 8760 hours.  ProBNP (last 3 results) No results for input(s): "PROBNP" in the last 8760 hours.  CBG: No results for input(s): "GLUCAP" in the last 168 hours.  Radiological Exams on Admission: CT Chest Wo Contrast  Result Date: 07/07/2022 CLINICAL DATA:  Lung nodule.  Fall. EXAM: CT CHEST WITHOUT CONTRAST TECHNIQUE: Multidetector  CT imaging of the chest was performed following the standard protocol without IV contrast. RADIATION DOSE REDUCTION: This exam was performed according to the departmental dose-optimization program which includes automated exposure control, adjustment of the mA and/or kV according to patient size and/or use of iterative reconstruction technique. COMPARISON:  Chest x-ray 07/07/2022.  CT chest 06/07/2004. FINDINGS: Cardiovascular: Heart is mildly enlarged. There is no pericardial effusion. Aorta is normal in size. There are severe atherosclerotic calcifications throughout the aorta. Mediastinum/Nodes: No enlarged mediastinal or axillary lymph nodes. Thyroid gland, trachea, and esophagus demonstrate no significant findings. Lungs/Pleura: Moderate emphysematous changes have progressed. New focal rounded nodular density seen in the medial left upper lobe abutting the mediastinum measuring 3.4 x 2.4 x 2.7 cm. Margins are slightly spiculated. Ground-glass opacities in the lung bases are favored is  atelectasis. No pleural effusion or pneumothorax. Upper Abdomen: There is a 3.5 cm cyst in the superior pole the right kidney. There are multiple left renal cysts the largest measuring 5.8 cm. There is a subcentimeter hyperdense area in the left kidney, most likely a proteinaceous cyst. Musculoskeletal: There is levoconvex curvature of the thoracic spine. Vertebral body hemangioma seen at T7. No acute fractures are seen. IMPRESSION: 1. New 3.4 cm nodular density in the left upper lobe abutting the mediastinum. Findings are highly suspicious for neoplasm. Per Fleischner Society Guidelines, recommend a PET/CT or tissue sampling. These guidelines do not apply to immunocompromised patients and patients with cancer. Follow up in patients with significant comorbidities as clinically warranted. For lung cancer screening, adhere to Lung-RADS guidelines. Reference: Radiology. 2017; 284(1):228-43. 2. No evidence for metastatic disease in the chest. 3. Moderate emphysema. 4. Mild cardiomegaly. 5. Bilateral renal cysts. 6. Multiple lesions, including left Bosniak II benign renal cyst measuring 5.8 cm. No follow-up imaging is recommended. JACR 2018 Feb; 264-273, Management of the Incidental Renal Mass on CT, [employer] 2021; 814-848, Bosniak Classification of Cystic Renal Masses, Version 2019. Aortic Atherosclerosis (ICD10-I70.0) and Emphysema (ICD10-J43.9). Electronically Signed   By: Ronney Asters M.D.   On: 07/07/2022 20:46   CT Cervical Spine Wo Contrast  Result Date: 07/07/2022 CLINICAL DATA:  Cervical spine CT 06/07/2004 EXAM: CT CERVICAL SPINE WITHOUT CONTRAST TECHNIQUE: Multidetector CT imaging of the cervical spine was performed without intravenous contrast. Multiplanar CT image reconstructions were also generated. RADIATION DOSE REDUCTION: This exam was performed according to the departmental dose-optimization program which includes automated exposure control, adjustment of the mA and/or kV according to patient  size and/or use of iterative reconstruction technique. COMPARISON:  None Available. FINDINGS: Alignment: There is degenerative grade 1 anterolisthesis at C4-C5, C7-T1, and T1-T2. Skull base and vertebrae: There is no acute cervical spine fracture. No aggressive osseous lesion. Soft tissues and spinal canal: No prevertebral soft tissue swelling. Disc levels: There is multilevel degenerative disc disease, severe at C4-C5. There is multilevel facet arthropathy bilaterally, severe on the left at C3-C4 and C4-C5. There is bony fusion of the right-sided C7-T1 facets into a lesser degree on the left. Upper chest: There is a partially visualized mass-like consolidation in the medial left apex measuring approximately 4.3 x 2.6 x 3.1 cm (series 3, image 72, series 6, image 38). There is centrilobular emphysema and interlobular septal thickening. Other: None. IMPRESSION: No acute cervical spine fracture. Multilevel degenerative disc disease of set arthropathy. Mass-like consolidation in the medial left apex measuring 4.3 x 2.6 x 3.1 cm, partially visualized. Recommend chest CT. Electronically Signed   By: Maurine Simmering M.D.   On: 07/07/2022 18:36  CT Head Wo Contrast  Result Date: 07/07/2022 CLINICAL DATA:  Head trauma. EXAM: CT HEAD WITHOUT CONTRAST TECHNIQUE: Contiguous axial images were obtained from the base of the skull through the vertex without intravenous contrast. RADIATION DOSE REDUCTION: This exam was performed according to the departmental dose-optimization program which includes automated exposure control, adjustment of the mA and/or kV according to patient size and/or use of iterative reconstruction technique. COMPARISON:  CT head 07/17/2020. FINDINGS: Brain: There is a small amount of acute subarachnoid hemorrhage in the anterior frontal lobes, right greater than left, and left insular region. There is no surrounding edema or mass effect. No extra-axial fluid collection or midline shift. No hydrocephalus.  Small old left frontoparietal cortical infarct is unchanged. Gray-white matter distinction is otherwise preserved. There is stable mild diffuse atrophy and mild periventricular white matter hypodensity, likely chronic small vessel ischemic change. Vascular: Atherosclerotic calcifications are present within the cavernous internal carotid arteries. Skull: There is acute fracture of the left inferior orbital wall the adjacent postseptal orbital hemorrhage and air. Fracture appears nondisplaced. There is also acute nondisplaced fracture of the lateral wall of the left orbit. There also acute fractures of the anterior inferior and lateral left maxillary sinus walls. Sinuses/Orbits: There is hemorrhage in the left maxillary and left sphenoid sinus. Other: Globes appear intact bilaterally. There is preseptal left orbital soft tissue swelling with hematoma. IMPRESSION: 1. Small amount of acute subarachnoid hemorrhage in the anterior frontal lobes, right greater than left, and left insular region. No surrounding edema or mass effect. 2. Acute fractures of the left inferior and lateral orbital walls and left maxillary sinus walls. 3. Preseptal left orbital soft tissue swelling with hematoma. Electronically Signed   By: Ronney Asters M.D.   On: 07/07/2022 18:32   DG Hand Complete Left  Result Date: 07/07/2022 CLINICAL DATA:  Bruising and swelling after fall EXAM: LEFT HAND - COMPLETE 3+ VIEW COMPARISON:  None Available. FINDINGS: Frontal, oblique, lateral views of the left hand are obtained. The bones are diffusely osteopenic. Mild diffuse interphalangeal joint space narrowing consistent with osteoarthritis crotch that there is multifocal osteoarthritis, with joint space narrowing throughout the interphalangeal and metacarpophalangeal joints, as well as within the radial aspect of the carpus. No acute fracture, subluxation, or dislocation. Soft tissues are unremarkable. IMPRESSION: 1. Diffuse osteopenia.  No acute  displaced fracture. 2. Multifocal osteoarthritis. Electronically Signed   By: Randa Ngo M.D.   On: 07/07/2022 17:55   DG Tibia/Fibula Left  Result Date: 07/07/2022 CLINICAL DATA:  Tripped and fell, bruising EXAM: LEFT TIBIA AND FIBULA - 2 VIEW COMPARISON:  None Available. FINDINGS: Frontal and lateral views of the left tibia and fibula are obtained. Bones are diffusely osteopenic. There are no acute displaced fractures. Alignment of the left knee and ankle is anatomic. Mild left knee and ankle osteoarthritis. Soft tissues are unremarkable. IMPRESSION: 1. Diffuse osteopenia.  No acute fracture. Electronically Signed   By: Randa Ngo M.D.   On: 07/07/2022 17:54   DG Hip Unilat W or Wo Pelvis 2-3 Views Left  Result Date: 07/07/2022 CLINICAL DATA:  [place], pain EXAM: DG HIP (WITH OR WITHOUT PELVIS) 2-3V LEFT COMPARISON:  05/31/2021 FINDINGS: Frontal view of the pelvis as well as frontal and cross-table lateral views of the left hip are obtained. Postsurgical changes are seen from prior ORIF of the left femur. The distal margins of the femoral intramedullary rod are excluded by collimation. I do not see any evidence of acute displaced fracture. No right hip  fractures. Stable mild bilateral hip osteoarthritis. The bones are diffusely osteopenic. The remainder of the bony pelvis is unremarkable. IMPRESSION: 1. Sequela from chronic left hip fracture status post ORIF. No evidence of superimposed acute injury. 2. Diffuse osteopenia.  No acute displaced pelvic fracture. Electronically Signed   By: Randa Ngo M.D.   On: 07/07/2022 17:53   DG Chest Portable 1 View  Result Date: 07/07/2022 CLINICAL DATA:  Tripped and fell, bruising EXAM: PORTABLE CHEST 1 VIEW COMPARISON:  01/19/2021 FINDINGS: Single frontal view of the chest was obtained with the patient rotated toward the right. The cardiac silhouette is unremarkable. Stable ectasia and atherosclerosis of the thoracic aorta. Diffuse increased interstitial  prominence most consistent with chronic scarring. No acute airspace disease, effusion, or pneumothorax. There are no acute displaced fractures. IMPRESSION: 1. Stable chest, no acute process. Electronically Signed   By: Randa Ngo M.D.   On: 07/07/2022 17:51    EKG: I independently viewed the EKG done and my findings are as followed: EKG was not done in the ED  Assessment/Plan Present on Admission: **None**  Principal Problem:   Subarachnoid hemorrhage (HCC) Active Problems:   Closed fracture of facial bone (HCC)   Facial laceration   Fall at home, initial encounter   Underweight   Failure to thrive in adult   Dementia [facility])   Depression  Subarachnoid hemorrhage Closed fracture of facial bone Facial laceration Fall at home Patient tripped while ambulating with her walker at home CT of head without contrast showed subarachnoid hemorrhage  in the anterior frontal lobes, right greater than left Patient was in no acute distress Neurosurgery was consulted and recommended repeat CT of head after 8 hours of initial CT Continue IV morphine as needed for moderate/severe pain. Continue fall precaution Continue PT/OT eval and treat  Underweight (BMI 16.99) Failure to thrive in adult Protein supplement to be provided Dietitian will be consulted and we shall await further recommendations  Left upper lobe nodule 3.4 nodular density in the left upper lobe abutting the mediastinum.  Findings highly suspicious for neoplasm.  PET/CT or tissue sampling recommended  Essential hypertension Continue lisinopril  Dementia Continue Aricept  Depression Continue Zoloft  Other home medications: Aspirin, multivitamin   DVT prophylaxis: SCDs  Code Status: Full code  Consults: Dietitian  Family Communication: Daughter at bedside (all questions answered to satisfaction)  Severity of Illness: The appropriate patient status for this patient is OBSERVATION. Observation status is judged  to be reasonable and necessary in order to provide the required intensity of service to ensure the patient's safety. The patient's presenting symptoms, physical exam findings, and initial radiographic and laboratory data in the context of their medical condition is felt to place them at decreased risk for further clinical deterioration. Furthermore, it is anticipated that the patient will be medically stable for discharge from the hospital within 2 midnights of admission.   Author: Bernadette Hoit, DO 07/07/2022 11:30 PM  For on call review [email or web address].

## 2022-07-08 ENCOUNTER — Observation Stay (HOSPITAL_COMMUNITY): Payer: Medicare Other

## 2022-07-08 DIAGNOSIS — I609 Nontraumatic subarachnoid hemorrhage, unspecified: Secondary | ICD-10-CM | POA: Diagnosis not present

## 2022-07-08 DIAGNOSIS — Z8679 Personal history of other diseases of the circulatory system: Secondary | ICD-10-CM | POA: Diagnosis not present

## 2022-07-08 DIAGNOSIS — S066X0A Traumatic subarachnoid hemorrhage without loss of consciousness, initial encounter: Secondary | ICD-10-CM | POA: Diagnosis not present

## 2022-07-08 DIAGNOSIS — S0232XA Fracture of orbital floor, left side, initial encounter for closed fracture: Secondary | ICD-10-CM | POA: Diagnosis not present

## 2022-07-08 LAB — COMPREHENSIVE METABOLIC PANEL
ALT: 15 U/L (ref 0–44)
AST: 23 U/L (ref 15–41)
Albumin: 3.2 g/dL — ABNORMAL LOW (ref 3.5–5.0)
Alkaline Phosphatase: 39 U/L (ref 38–126)
Anion gap: 8 (ref 5–15)
BUN: 17 mg/dL (ref 8–23)
CO2: 31 mmol/L (ref 22–32)
Calcium: 8.4 mg/dL — ABNORMAL LOW (ref 8.9–10.3)
Chloride: 102 mmol/L (ref 98–111)
Creatinine, Ser: 0.71 mg/dL (ref 0.44–1.00)
GFR, Estimated: >60 mL/min (ref >60)
Glucose, Bld: 112 mg/dL — ABNORMAL HIGH (ref 70–99)
Potassium: 2.8 mmol/L — ABNORMAL LOW (ref 3.5–5.1)
Sodium: 141 mmol/L (ref 135–145)
Total Bilirubin: 0.6 mg/dL (ref 0.3–1.2)
Total Protein: 5.8 g/dL — ABNORMAL LOW (ref 6.5–8.1)

## 2022-07-08 LAB — CBC
HCT: 31.7 % — ABNORMAL LOW (ref 36.0–46.0)
Hemoglobin: 10.3 g/dL — ABNORMAL LOW (ref 12.0–15.0)
MCH: 29.9 pg (ref 26.0–34.0)
MCHC: 32.5 g/dL (ref 30.0–36.0)
MCV: 92.2 fL (ref 80.0–100.0)
Platelets: 184 10*3/uL (ref 150–400)
RBC: 3.44 MIL/uL — ABNORMAL LOW (ref 3.87–5.11)
RDW: 14.3 % (ref 11.5–15.5)
WBC: 8.8 10*3/uL (ref 4.0–10.5)
nRBC: 0 % (ref 0.0–0.2)

## 2022-07-08 LAB — MAGNESIUM: Magnesium: 1.9 mg/dL (ref 1.7–2.4)

## 2022-07-08 LAB — PHOSPHORUS: Phosphorus: 3.1 mg/dL (ref 2.5–4.6)

## 2022-07-08 MED ORDER — ONDANSETRON 4 MG PO TBDP: 4.0000 mg | ORAL_TABLET | Freq: Three times a day (TID) | ORAL | 0 refills | Status: AC | PRN

## 2022-07-08 MED ORDER — MORPHINE SULFATE (PF) 2 MG/ML IV SOLN
2.0000 mg | INTRAVENOUS | Status: DC | PRN
  Administered 2022-07-08 (×3): 2 mg via INTRAVENOUS
  Filled 2022-07-08 (×4): qty 1

## 2022-07-08 MED ORDER — ONDANSETRON HCL 4 MG/2ML IJ SOLN: 4.0000 mg | Freq: Three times a day (TID) | INTRAMUSCULAR | Status: DC

## 2022-07-08 MED ORDER — POTASSIUM CHLORIDE CRYS ER 10 MEQ PO TBCR: 10.0000 meq | EXTENDED_RELEASE_TABLET | Freq: Every day | ORAL | 0 refills | Status: AC

## 2022-07-08 MED ORDER — POTASSIUM CHLORIDE 10 MEQ/100ML IV SOLN
10.0000 meq | INTRAVENOUS | Status: AC
  Administered 2022-07-08 (×3): 10 meq via INTRAVENOUS
  Filled 2022-07-08 (×3): qty 100

## 2022-07-08 MED ORDER — ONDANSETRON HCL 4 MG/2ML IJ SOLN: 4.0000 mg | Freq: Three times a day (TID) | INTRAMUSCULAR | Status: DC | PRN

## 2022-07-08 MED ORDER — ONDANSETRON HCL 4 MG/2ML IJ SOLN
4.0000 mg | Freq: Four times a day (QID) | INTRAMUSCULAR | Status: DC | PRN
  Administered 2022-07-08: 4 mg via INTRAVENOUS
  Filled 2022-07-08: qty 2

## 2022-07-08 MED ORDER — POTASSIUM CHLORIDE CRYS ER 20 MEQ PO TBCR
40.0000 meq | EXTENDED_RELEASE_TABLET | Freq: Two times a day (BID) | ORAL | Status: DC
  Filled 2022-07-08: qty 2

## 2022-07-08 MED ORDER — ONDANSETRON HCL 4 MG/2ML IJ SOLN
INTRAMUSCULAR | Status: AC
  Administered 2022-07-08: 4 mg
  Filled 2022-07-08: qty 2

## 2022-07-08 NOTE — Plan of Care (Signed)
  Problem: Acute Rehab OT Goals (only OT should resolve) Goal: Pt. Will Perform Grooming Flowsheets (Taken 07/08/2022 1037) Pt Will Perform Grooming:  with min guard assist  sitting Goal: Pt. Will Perform Upper Body Bathing Flowsheets (Taken 07/08/2022 1037) Pt Will Perform Upper Body Bathing:  with min guard assist  sitting Goal: Pt. Will Perform Lower Body Bathing Flowsheets (Taken 07/08/2022 1037) Pt Will Perform Lower Body Bathing:  with min assist  with mod assist  sitting/lateral leans  with adaptive equipment Goal: Pt. Will Perform Upper Body Dressing Flowsheets (Taken 07/08/2022 1037) Pt Will Perform Upper Body Dressing:  with min guard assist  sitting Goal: Pt. Will Perform Lower Body Dressing Flowsheets (Taken 07/08/2022 1037) Pt Will Perform Lower Body Dressing:  with min assist  with mod assist  sitting/lateral leans  with adaptive equipment Goal: Pt. Will Transfer To Toilet Flowsheets (Taken 07/08/2022 1037) Pt Will Transfer to Toilet:  with min assist  stand pivot transfer Goal: Pt. Will Perform Toileting-Clothing Manipulation Flowsheets (Taken 07/08/2022 1037) Pt Will Perform Toileting - Clothing Manipulation and hygiene:  with mod assist  sitting/lateral leans Goal: Pt/Caregiver Will Perform Home Exercise Program Flowsheets (Taken 07/08/2022 1037) Pt/caregiver will Perform Home Exercise Program:  Increased strength  Right Upper extremity  Left upper extremity  Increased ROM  With Supervision  Chrisandra Wiemers OT, MOT

## 2022-07-08 NOTE — Evaluation (Signed)
Clinical/Bedside Swallow Evaluation Patient Details  Name: Amy Sawyer MRN: [id number] Date of Birth: 07/21/1931  Today's Date: 07/08/2022 Time: SLP Start Time (ACUTE ONLY): 0900 SLP Stop Time (ACUTE ONLY): 0926 SLP Time Calculation (min) (ACUTE ONLY): 26 min  Past Medical History:  Past Medical History:  Diagnosis Date   HOH (hard of hearing)    HTN (hypertension)    Hyperlipemia    Proximal humerus fracture 03/13/2012   doi 03-13-2012 left     Stroke [facility])    Past Surgical History:  Past Surgical History:  Procedure Laterality Date   BREAST SURGERY     INTRAMEDULLARY (IM) NAIL INTERTROCHANTERIC Left 01/20/2021   Procedure: OPERATIVE FIXATION OF LEFT HIP FRACTURE;  Surgeon: Mordecai Rasmussen, MD;  Location: [facility];  Service: Orthopedics;  Laterality: Left;   LEG SURGERY     HPI:  Amy Sawyer is a 88 y.o. female with medical history significant of hypertension, HOH, dementia, depression who presents to the emergency department after sustaining a fall at home.  Patient ambulates with a walker and while walking today, she accidentally tripped and fell landing on the left side of head where she sustained laceration to her left temple area.  She complained of left hand and left leg pain.  BSE requested; pt daughter reported recent choking episodes at home.   MBSS completed 01/22/2021 << Pt presents with mild pharyngeal dysphagia characterized by consistent deep penetration of thin liquids that is always sensed either by a reflexive cough or throat clear, however penetrates are not always cleared from the airway and despite not being visualized are questionably falling below the cords after the swallow; with consecutive straw sips of thin liquids note a moderate amount falling to cords and trace aspiration. Pt demonstrated decreased laryngeal vestibule closure and decreased pharyngeal squeeze; decreased squeeze results in mild to mod valleculae and pyriform residue after the swallow across  consistencies; residue is cleared by a repeat swallow. Pt consumed NTL with one episode of flash penetration that was completely cleared during the swallow. Pt was unable to swallow the barium tablet -- eventually it was expectorated. Solid textures and puree textures were consumed without incident. Recommend continue with D2/fine chop diet and NTL - recommend meds to be crushed in puree. Further recommend initiate dysphagia therapy to facilitate pharyngeal strengthening and trials of thin liquids; Pt is a good candidate for free water protocol *water only in between meals AFTER oral care*. Repeat MBS may be indicated when clinically appropriate, however, no silent aspiration was observed, therefore, clinical/bedside upgrade may be appropriate. ST will continue to follow acutely, thank you >>   Assessment / Plan / Recommendation  Clinical Impression  Clinical swallowing evaluation completed while Pt was sitting upright in chair. Note MBSS completed on this Pt in 2022 - results detailed above. Pt was just finishing up with PT/OT upon SLP entering the room and PT reports episode of emesis and SLP noted emesis to be dark brown/black liquid; reportedly this episode occurred as soon as Pt sat up in bed and after eating some of her breakfast. Pt did accept 2 small straw sips of "cold water" for me with noted throat clearing, suspected delayed swallow and piecemeal swallowing. Pt accepted 1-2 sips of NTL without overt s/sx of aspiration. Pt did agree to one bite of mech soft with noted prolonged oral prep stage and eventual expectoration of mech soft trial. Pt also reported "I'm going to be sick again" but despite gagging no actual contents were  expectorated. ? Esophageal component and would recommend consider GI consult if Pt continues to "get sick" have episodes of emesis directly after PO. Pt reports pain in her leg and arm and requests "morphine". Above to RN. Recommend downgrade to D2/fine chop diet and NTL; ST  will continue to follow for possible need for repeat MBSS and/or subjective upgrade. SLP Visit Diagnosis: Dysphagia, unspecified (R13.10)    Aspiration Risk  Mild aspiration risk;Moderate aspiration risk    Diet Recommendation Dysphagia 2 (Fine chop);Nectar-thick liquid   Liquid Administration via: Cup;Straw Medication Administration: Crushed with puree Supervision: Staff to assist with self feeding;Full supervision/cueing for compensatory strategies Compensations: Minimize environmental distractions;Slow rate;Small sips/bites;Follow solids with liquid Postural Changes: Seated upright at 90 degrees    Other  Recommendations Oral Care Recommendations: Oral care BID;Oral care prior to ice chip/H20    Recommendations for follow up therapy are one component of a multi-disciplinary discharge planning process, led by the attending physician.  Recommendations may be updated based on patient status, additional functional criteria and insurance authorization.           Frequency and Duration min 1 x/week  1 week        Swallow Study   General HPI: Amy Sawyer is a 88 y.o. female with medical history significant of hypertension, HOH, dementia, depression who presents to the emergency department after sustaining a fall at home.  Patient ambulates with a walker and while walking today, she accidentally tripped and fell landing on the left side of head where she sustained laceration to her left temple area.  She complained of left hand and left leg pain.  BSE requested; pt daughter reported recent choking episodes at home. Type of Study: Bedside Swallow Evaluation Previous Swallow Assessment: MBSS completed 01/22/21 Diet Prior to this Study: Thin liquids;Regular Temperature Spikes Noted: No Respiratory Status: Room air History of Recent Intubation: No Behavior/Cognition: Alert;Cooperative Oral Cavity Assessment: Within Functional Limits Oral Care Completed by SLP: Recent completion by  staff Oral Cavity - Dentition: Missing dentition Vision: Functional for self-feeding Self-Feeding Abilities: Able to feed self;Needs assist;Needs set up Patient Positioning: Upright in chair Baseline Vocal Quality: Normal Volitional Cough: Other (Comment) (Pt very HOH) Volitional Swallow: Unable to elicit    Oral/Motor/Sensory Function Overall Oral Motor/Sensory Function: Within functional limits   Ice Chips Ice chips: Not tested   Thin Liquid Thin Liquid: Impaired Presentation: Cup;Straw Oral Phase Impairments: Poor awareness of bolus Pharyngeal  Phase Impairments: Multiple swallows;Suspected delayed Swallow;Throat Clearing - Delayed;Cough - Delayed    Nectar Thick Nectar Thick Liquid: Within functional limits   Honey Thick Honey Thick Liquid: Not tested   Puree Puree: Not tested   Solid     Solid: Impaired Presentation: Self Fed Oral Phase Impairments: Reduced lingual movement/coordination Oral Phase Functional Implications: Prolonged oral transit;Oral holding Pharyngeal Phase Impairments:  (solid expectorated)     Neliah Cuyler H. Roddie Mc, CCC-SLP Speech Language Pathologist  Wende Bushy 07/08/2022,9:41 AM

## 2022-07-08 NOTE — Evaluation (Signed)
Physical Therapy Evaluation Patient Details Name: Amy Sawyer MRN: [id number] DOB: 11-25-1930 Today's Date: 07/08/2022  History of Present Illness  Amy Sawyer is a 88 y.o. female with medical history significant of hypertension, HOH, dementia, depression who presents to the emergency department after sustaining a fall at home.  Patient ambulates with a walker and while walking today, she accidentally tripped and fell landing on the left side of head where she sustained laceration to her left temple area.  She complained of left hand and left leg pain.  Daughter at bedside endorsed that patient has not been sick recently.  She denies chest pain, shortness of breath fever, chills, nausea, vomiting, diarrhea or constipation.   Clinical Impression  Patient demonstrates slow labored movement for sitting up at bedside with c/o severe pain left hip, excessively internally rotated and increased pain with any movement.  Patient had episode of emesis upon sitting up at bedside and limited to a few slow labored side steps to transfer to chair due to fatigue and generalized weakness - RN notified.  Patient tolerated sitting up in chair after therapy with speech therapist, RN in room.  Patient will benefit from continued skilled physical therapy in hospital and recommended venue below to increase strength, balance, endurance for safe ADLs and gait.          Recommendations for follow up therapy are one component of a multi-disciplinary discharge planning process, led by the attending physician.  Recommendations may be updated based on patient status, additional functional criteria and insurance authorization.  Follow Up Recommendations Skilled nursing-short term rehab (<3 hours/day) Can patient physically be transported by private vehicle: No    Assistance Recommended at Discharge    Patient can return home with the following  A lot of help with bathing/dressing/bathroom;A lot of help with walking  and/or transfers;Help with stairs or ramp for entrance;Assistance with cooking/housework    Equipment Recommendations None recommended by PT  Recommendations for Other Services       Functional Status Assessment Patient has had a recent decline in their functional status and demonstrates the ability to make significant improvements in function in a reasonable and predictable amount of time.     Precautions / Restrictions Precautions Precautions: Fall Restrictions Weight Bearing Restrictions: No      Mobility  Bed Mobility Overal bed mobility: Needs Assistance Bed Mobility: Supine to Sit     Supine to sit: Mod assist     General bed mobility comments: Labored movement; increased pain.    Transfers Overall transfer level: Needs assistance Equipment used: Rolling walker (2 wheels) Transfers: Sit to/from Stand, Bed to chair/wheelchair/BSC Sit to Stand: Max assist Stand pivot transfers: Mod assist, Max assist         General transfer comment: poor tolerance for weightbearing on LLE due to increased pain    Ambulation/Gait Ambulation/Gait assistance: Max assist Gait Distance (Feet): 3 Feet Assistive device: Rolling walker (2 wheels) Gait Pattern/deviations: Decreased step length - right, Decreased step length - left, Decreased stance time - left, Decreased stride length, Antalgic, Knees buckling Gait velocity: slow     General Gait Details: limited to a few slow labored side steps at bedside mostly due poor tolerance for weightbearing on LLE due to increased pain  Stairs            Wheelchair Mobility    Modified Rankin (Stroke Patients Only)       Balance Overall balance assessment: Needs assistance Sitting-balance support: Feet supported, No upper  extremity supported Sitting balance-Leahy Scale: Poor Sitting balance - Comments: seated at EOB   Standing balance support: Bilateral upper extremity supported, During functional activity, Reliant on  assistive device for balance Standing balance-Leahy Scale: Poor Standing balance comment: using RW                             Pertinent Vitals/Pain Pain Assessment Pain Assessment: Faces Faces Pain Scale: Hurts even more Pain Location: L hip/leg; L shoulder with P/ROM; L side of head/face Pain Descriptors / Indicators: Grimacing, Guarding, Moaning Pain Intervention(s): Limited activity within patient's tolerance, Monitored during session, Repositioned    Home Living Family/patient expects to be discharged to:: Private residence Living Arrangements: Children Available Help at Discharge: Family;Available PRN/intermittently Type of Home: House Home Access: Ramped entrance     Alternate Level Stairs-Number of Steps: 15 Home Layout: Two level;Able to live on main level with bedroom/bathroom;Full bath on main level Home Equipment: Rolling Walker (2 wheels);BSC/3in1;Cane - single point;Wheelchair - manual Additional Comments: History taken from most recent PT evaluation over a year old. Pt is a poor historian with a history of dementia and significant hearing loss.    Prior Function Prior Level of Function : Patient poor historian/Family not available             Mobility Comments: Per documentation review pt is ambulating with RW. ADLs Comments: assisted by family     Hand Dominance   Dominant Hand: Right    Extremity/Trunk Assessment   Upper Extremity Assessment Upper Extremity Assessment: Defer to OT evaluation RUE Deficits / Details: Generally weak. LUE Deficits / Details: Limited to ~75% P/ROM of shoulder flexion. A/ROM limited to ~50% available range. Pt grimacing with increased pain during P/ROM.    Lower Extremity Assessment Lower Extremity Assessment: Generalized weakness;LLE deficits/detail LLE Deficits / Details: grossly -3/5 LLE: Unable to fully assess due to pain LLE Sensation: WNL LLE Coordination: WNL    Cervical / Trunk  Assessment Cervical / Trunk Assessment: Kyphotic  Communication   Communication: HOH  Cognition Arousal/Alertness: Awake/alert Behavior During Therapy: WFL for tasks assessed/performed Overall Cognitive Status: History of cognitive impairments - at baseline                                          General Comments      Exercises     Assessment/Plan    PT Assessment Patient needs continued PT services  PT Problem List Decreased strength;Decreased activity tolerance;Decreased range of motion;Decreased balance;Decreased mobility       PT Treatment Interventions DME instruction;Gait training;Stair training;Functional mobility training;Therapeutic activities;Therapeutic exercise;Patient/family education;Balance training    PT Goals (Current goals can be found in the Care Plan section)  Acute Rehab PT Goals Patient Stated Goal: return home PT Goal Formulation: With patient Time For Goal Achievement: 07/22/22 Potential to Achieve Goals: Fair    Frequency Min 3X/week     Co-evaluation PT/OT/SLP Co-Evaluation/Treatment: Yes Reason for Co-Treatment: Complexity of the patient's impairments (multi-system involvement);To address functional/ADL transfers PT goals addressed during session: Mobility/safety with mobility;Balance;Proper use of DME OT goals addressed during session: ADL's and self-care       AM-PAC PT "6 Clicks" Mobility  Outcome Measure Help needed turning from your back to your side while in a flat bed without using bedrails?: A Lot Help needed moving from lying on your back  to sitting on the side of a flat bed without using bedrails?: A Lot Help needed moving to and from a bed to a chair (including a wheelchair)?: A Lot Help needed standing up from a chair using your arms (e.g., wheelchair or bedside chair)?: A Lot Help needed to walk in hospital room?: A Lot Help needed climbing 3-5 steps with a railing? : Total 6 Click Score: 11    End of  Session   Activity Tolerance: Patient tolerated treatment well;Patient limited by fatigue;Patient limited by pain Patient left: in chair;with call bell/phone within reach Nurse Communication: Mobility status PT Visit Diagnosis: Unsteadiness on feet (R26.81);Other abnormalities of gait and mobility (R26.89);Muscle weakness (generalized) (M62.81)    Time: 6546-5035 PT Time Calculation (min) (ACUTE ONLY): 20 min   Charges:   PT Evaluation $PT Eval Moderate Complexity: 1 Mod PT Treatments $Therapeutic Activity: 8-22 mins        12:21 PM, 07/08/22 Lonell Grandchild, MPT Physical Therapist with [phone] office [phone] mobile phone

## 2022-07-08 NOTE — TOC Initial Note (Addendum)
Transition of Care [facility]) - Initial/Assessment Note    Patient Details  Name: Amy Sawyer MRN: [id number] Date of Birth: Apr 11, 1931  [facility]) CM/SW Contact:    Ihor Gully, LCSW Phone Number: 07/08/2022, 11:29 AM  Clinical Narrative:                 Patient from home with daughter. Another daughter, Joelene Millin, lives within eyesight from patient. Patient admitted for Subarachnoid hemorrhage. Has 2 wc in home, 3 walkers. Totally dependent on daughter for showers. Can independently "sponge off". Active with [facility] services. PT recommends SNF, discussed with daughter that patient does not have required three midnight inpatients.  Per attending patient will d/c needing hospice services.  Informed Chelsea and Keka with [facility] of patient's need to transition from palliative to Hospice.   Expected Discharge Plan: Home w Hospice Care Barriers to Discharge: No Barriers Identified   Patient Goals and CMS Choice        Expected Discharge Plan and Services Expected Discharge Plan: [employer] Acute Care Choice: Hospice Living arrangements for the past 2 months: Single Family Home                                      Prior Living Arrangements/Services Living arrangements for the past 2 months: Single Family Home Lives with:: Adult Children Patient language and need for interpreter reviewed:: Yes        Need for Family Participation in Patient Care: Yes (Comment) Care giver support system in place?: Yes (comment) Current home services: DME Criminal Activity/Legal Involvement Pertinent to Current Situation/Hospitalization: No - Comment as needed  Activities of Daily Living Home Assistive Devices/Equipment: Hearing aid, Wheelchair, Environmental consultant (specify type) ADL Screening (condition at time of admission) Patient's cognitive ability adequate to safely complete daily activities?: Yes Is the patient deaf or have  difficulty hearing?: Yes Does the patient have difficulty seeing, even when wearing glasses/contacts?: No Does the patient have difficulty concentrating, remembering, or making decisions?: Yes Patient able to express need for assistance with ADLs?: Yes Does the patient have difficulty dressing or bathing?: Yes Independently performs ADLs?: No Communication: Independent Dressing (OT): Needs assistance Is this a change from baseline?: Pre-admission baseline Grooming: Needs assistance Is this a change from baseline?: Pre-admission baseline Feeding: Independent (after set up) Bathing: Needs assistance Is this a change from baseline?: Pre-admission baseline Toileting: Needs assistance Is this a change from baseline?: Pre-admission baseline In/Out Bed: Needs assistance Is this a change from baseline?: Pre-admission baseline Walks in Home: Needs assistance Is this a change from baseline?: Pre-admission baseline Does the patient have difficulty walking or climbing stairs?: Yes Weakness of Legs: Both Weakness of Arms/Hands: Both  Permission Sought/Granted Permission sought to share information with : Family Supports    Share Information with NAME: Daughter, Joelene Millin           Emotional Assessment     Affect (typically observed): Appropriate Orientation: : Oriented to Self, Oriented to Place Alcohol / Substance Use: Not Applicable Psych Involvement: No (comment)  Admission diagnosis:  Subarachnoid hemorrhage ([place]) [I60.9] Facial laceration, initial encounter [S01.81XA] Closed fracture of facial bone, unspecified facial bone, initial encounter [facility]) [S02.92XA] Patient Active Problem List   Diagnosis Date Noted   Subarachnoid hemorrhage ([place]) 07/07/2022   Closed fracture of facial bone ([place]) 07/07/2022   Facial laceration 07/07/2022  Fall at home, initial encounter 07/07/2022   Underweight 07/07/2022   Failure to thrive in adult 07/07/2022   Dementia ([place]) 07/07/2022    Depression 07/07/2022   Left upper lobe pulmonary nodule 07/07/2022   Hypercoagulable state associated with COVID-19 ([place]) 02/03/2021   Lab test positive for detection of COVID-19 virus 02/03/2021   Pneumonia due to COVID-19 virus 02/03/2021   Aortic atherosclerosis ([place]) 01/25/2021   History of CVA (cerebrovascular accident) 01/25/2021   Chronic constipation 01/25/2021   Major depression, recurrent, chronic ([place]) 01/25/2021   Acute blood loss anemia 01/25/2021   Hip fracture ([place]) 01/19/2021   Mixed stress and urge urinary incontinence 04/10/2020   Hyperlipemia 02/19/2018   Hypertension 02/19/2018   Unspecified protein-calorie malnutrition ([place]) 02/19/2018   Hyponatremia 03/22/2014   PCP:  Asencion Noble, MD Pharmacy:   [facility] [place] [address] [place] [address] Phone: [phone] Fax: [phone]     Social Determinants of Health (SDOH) Interventions Housing Interventions: Intervention Not Indicated  Readmission Risk Interventions     No data to display

## 2022-07-08 NOTE — Progress Notes (Signed)
Initial Nutrition Assessment  DOCUMENTATION CODES:   Underweight  INTERVENTION:  Magic cup TID with meals   NUTRITION DIAGNOSIS:   Inadequate oral intake related to dysphagia as evidenced by per patient/family report.   GOAL:  Patient will meet greater than or equal to 90% of their needs   MONITOR:  PO intake, Supplement acceptance, Labs, Weight trends  REASON FOR ASSESSMENT:   Consult Assessment of nutrition requirement/status  ASSESSMENT: Patient is a 88 yo female with hx of dementia, depression, HTN, Stroke, dysphagia and HOH. She presents to ED after falling at home.  ST evaluation this morning. Episode of coffee ground emesis per ST note.   Patient on Dysphagia 2 with Nectar thick liquids. Patient daughter is bedside and pt is feeding herself mashed potatoes with gravy. At home she has been eating very little meat. She drinks a protein smoothie for breakfast and has peanut butter sandwich with meds. Patient also likes vegetable soup, ham sandwiches. Sweet foods are her favorite.   History of weight loss when patient had hip surgery. She has been maintaining around 87 lb per daughter. Unable to complete NFPE during visit- pt eating lunch. At risk for malnutrition.   Medications: MVI, KLOR-CON, Aricept     Latest Ref Rng & Units 07/08/2022    4:11 AM 07/07/2022    5:56 PM 06/23/2021    2:20 PM  BMP  Glucose 70 - 99 mg/dL 112  114  94   BUN 8 - 23 mg/dL 17  17  8    Creatinine 0.44 - 1.00 mg/dL 0.71  0.92  0.68   Sodium 135 - 145 mmol/L 141  139  135   Potassium 3.5 - 5.1 mmol/L 2.8  3.2  3.9   Chloride 98 - 111 mmol/L 102  102  96   CO2 22 - 32 mmol/L 31  32  30   Calcium 8.9 - 10.3 mg/dL 8.4  8.9  9.4     Diet Order:   Diet Order             DIET DYS 2 Room service appropriate? Yes; Fluid consistency: Nectar Thick  Diet effective now                   EDUCATION NEEDS:  Education needs have been addressed  Skin:   skin tear  Last BM:   11/2  Height:   Ht Readings from Last 1 Encounters:  07/07/22 5' (1.524 m)    Weight:   Wt Readings from Last 1 Encounters:  07/07/22 40.1 kg    Ideal Body Weight:   45 kg  BMI:  Body mass index is 17.27 kg/m.  Estimated Nutritional Needs:   Kcal:  1300-1500  Protein:  60-65 gr  Fluid:  1.3-1.4 liters daily   Colman Cater MS,RD,CSG,LDN Contact: Shea [place]

## 2022-07-08 NOTE — Progress Notes (Signed)
Pt had episode of emesis this shift, approx 50 ml, specks of blood noted.  Pt did have dried blood on lips upon arrival to unit.  Pt has difficulty opening mouth, no abnormalities observed in mouth however.  Oral cares provided.  Zofran administered.  Suction at bedside.  Pt primary source of pain is in left hand.  Pt becomes tearful with episodes of pain, managed with prn morphine.  Left hand skin tear rebandaged due to drainage.  Mepilex to abrasion/bruising on left hip and protective on coccyx.  Afib on monitor.  SLP consult placed, pt daughter reported recent choking episodes at home.  Pt able to tolerate sips of water in upright position.

## 2022-07-08 NOTE — Evaluation (Signed)
Occupational Therapy Evaluation Patient Details Name: Amy Sawyer MRN: [id number] DOB: 10/31/1930 Today's Date: 07/08/2022   History of Present Illness Amy Sawyer is a 88 y.o. female with medical history significant of hypertension, HOH, dementia, depression who presents to the emergency department after sustaining a fall at home.  Patient ambulates with a walker and while walking today, she accidentally tripped and fell landing on the left side of head where she sustained laceration to her left temple area.  She complained of left hand and left leg pain.  Daughter at bedside endorsed that patient has not been sick recently.  She denies chest pain, shortness of breath fever, chills, nausea, vomiting, diarrhea or constipation. (per DO)   Clinical Impression   Pt agreeable to OT and PT co-evaluation. Pt noted to have increase in pain with mobility of L LE and L UE. Pt needs moderate assist for bed mobility with head of bed elevated some. Pt able to transfer to chair with mod to max A with RW. L UE limited in active and passive range of motion. Pt is needing max A for lower body dressing tasks and likely  moderate for upper body dressing and bathing. Pt  noted to vomit once seated at EOB from supine position. Pt was transferred to the chair by PT. ST present with pt when this therapist left. Pt will benefit from continued OT in the hospital and recommended venue below to increase strength, balance, and endurance for safe ADL's.        Recommendations for follow up therapy are one component of a multi-disciplinary discharge planning process, led by the attending physician.  Recommendations may be updated based on patient status, additional functional criteria and insurance authorization.   Follow Up Recommendations  Skilled nursing-short term rehab (<3 hours/day)    Assistance Recommended at Discharge Frequent or constant Supervision/Assistance  Patient can return home with the following A  lot of help with walking and/or transfers;A lot of help with bathing/dressing/bathroom;Assistance with cooking/housework;Assistance with feeding;Assist for transportation;Help with stairs or ramp for entrance;Direct supervision/assist for medications management    Functional Status Assessment  Patient has had a recent decline in their functional status and demonstrates the ability to make significant improvements in function in a reasonable and predictable amount of time.  Equipment Recommendations  None recommended by OT    Recommendations for Other Services       Precautions / Restrictions Precautions Precautions: Fall Restrictions Weight Bearing Restrictions: No      Mobility Bed Mobility Overal bed mobility: Needs Assistance Bed Mobility: Supine to Sit     Supine to sit: Mod assist     General bed mobility comments: Labored movement; increased pain.    Transfers Overall transfer level: Needs assistance Equipment used: Rolling walker (2 wheels) Transfers: Sit to/from Stand, Bed to chair/wheelchair/BSC Sit to Stand: Max assist Stand pivot transfers: Mod assist, Max assist         General transfer comment: Assisted by PT; much assist needed at this time; limited weight bearing before pt started flexing knees.      Balance Overall balance assessment: Needs assistance Sitting-balance support: Bilateral upper extremity supported, Feet supported Sitting balance-Leahy Scale: Poor Sitting balance - Comments: seated at EOB Postural control: Posterior lean Standing balance support: Bilateral upper extremity supported, During functional activity, Reliant on assistive device for balance Standing balance-Leahy Scale: Poor Standing balance comment: using RW  ADL either performed or assessed with clinical judgement   ADL Overall ADL's : Needs assistance/impaired Eating/Feeding: Set up;Sitting   Grooming: Minimal assistance;Moderate  assistance;Sitting   Upper Body Bathing: Minimal assistance;Sitting   Lower Body Bathing: Maximal assistance;Sitting/lateral leans;Bed level   Upper Body Dressing : Minimal assistance;Moderate assistance;Sitting   Lower Body Dressing: Maximal assistance;Bed level Lower Body Dressing Details (indicate cue type and reason): max A to don socks at bed level today Toilet Transfer: Maximal assistance;Moderate assistance;Rolling walker (2 wheels);Stand-pivot [employer] forces technical officer Details (indicate cue type and reason): Simulated via EOB to chair transfer [place] and Hygiene: Total assistance;Maximal assistance;Bed level               Vision Baseline Vision/History:  (unsure) Patient Visual Report: Other (comment) (Unsure due to pt cognitive status and hearing deficits.) Vision Assessment?:  (No observed deficits during functional tasks. Futher assessment through functional tasks for formal assessment needed.)                Pertinent Vitals/Pain Pain Assessment Pain Assessment: Faces Faces Pain Scale: Hurts even more Pain Location: L hip/leg; L shoulder with P/ROM; L side of head/face Pain Descriptors / Indicators: Grimacing, Guarding, Moaning Pain Intervention(s): Limited activity within patient's tolerance, Monitored during session, Repositioned     Hand Dominance     Extremity/Trunk Assessment Upper Extremity Assessment Upper Extremity Assessment: RUE deficits/detail;LUE deficits/detail RUE Deficits / Details: Generally weak. LUE Deficits / Details: Limited to ~75% P/ROM of shoulder flexion. A/ROM limited to ~50% available range. Pt grimacing with increased pain during P/ROM.   Lower Extremity Assessment Lower Extremity Assessment: Defer to PT evaluation   Cervical / Trunk Assessment Cervical / Trunk Assessment: Kyphotic   Communication Communication Communication: HOH (Very hard of hearing. Pt reported being deaf but nursing stated that pt can hear  out of R ear if spoken to very loudly.)   Cognition Arousal/Alertness: Awake/alert Behavior During Therapy: WFL for tasks assessed/performed Overall Cognitive Status: History of cognitive impairments - at baseline                                                        [place] expects to be discharged to:: Private residence Living Arrangements:  (unsure) Available Help at Discharge: Family Type of Home: House Home Access: Ramped entrance     Home Layout: Two level               Home Equipment: [place] (2 wheels);BSC/3in1;Cane - single point;Wheelchair - manual   Additional Comments: History taken from most recent PT evaluation over a year old. Pt is a poor historian with a history of dementia and significant hearing loss.      Prior Functioning/Environment Prior Level of Function : Patient poor historian/Family not available             Mobility Comments: Per documentation review pt is ambulating with RW. ADLs Comments: Unsure about recent ADL status.        OT Problem List: Decreased strength;Decreased range of motion;Decreased activity tolerance;Impaired balance (sitting and/or standing);Decreased cognition;Pain      OT Treatment/Interventions: Self-care/ADL training;Therapeutic exercise;DME and/or AE instruction;Therapeutic activities;Cognitive remediation/compensation;Patient/family education;Balance training;Visual/perceptual remediation/compensation    OT Goals(Current goals can be found in the care plan section) Acute Rehab OT Goals Patient Stated Goal: Get back to bed. (no  other goal stated) OT Goal Formulation: With patient Time For Goal Achievement: 07/22/22 Potential to Achieve Goals: Fair  OT Frequency: Min 2X/week    Co-evaluation PT/OT/SLP Co-Evaluation/Treatment: Yes Reason for Co-Treatment: Complexity of the patient's impairments (multi-system involvement)   OT goals addressed during session:  ADL's and self-care      AM-PAC OT "6 Clicks" Daily Activity     Outcome Measure Help from another person eating meals?: A Little Help from another person taking care of personal grooming?: A Lot Help from another person toileting, which includes using toliet, bedpan, or urinal?: A Lot Help from another person bathing (including washing, rinsing, drying)?: A Lot Help from another person to put on and taking off regular upper body clothing?: A Lot Help from another person to put on and taking off regular lower body clothing?: A Lot 6 Click Score: 13   End of Session Equipment Utilized During Treatment: Rolling walker (2 wheels) Nurse Communication: Mobility status  Activity Tolerance: Patient limited by pain Patient left: in chair;with call bell/phone within reach;with chair alarm set  OT Visit Diagnosis: Unsteadiness on feet (R26.81);Other abnormalities of gait and mobility (R26.89);History of falling (Z91.81);Muscle weakness (generalized) (M62.81);Pain;Adult, failure to thrive (R62.7) Pain - Right/Left: Left Pain - part of body: Shoulder;Hip;Leg                Time: 3335-4562 OT Time Calculation (min): 20 min Charges:  OT General Charges $OT Visit: 1 Visit OT Evaluation $OT Eval Low Complexity: 1 Low  Tressa Maldonado OT, MOT  Larey Seat 07/08/2022, 10:33 AM

## 2022-07-08 NOTE — Care Management Obs Status (Signed)
[facility] NOTIFICATION   Patient Details  Name: Amy Sawyer MRN: [id number] Date of Birth: June 03, 1931   Medicare Observation Status Notification Given:  Yes    Ihor Gully, LCSW 07/08/2022, 9:42 AM

## 2022-07-08 NOTE — Discharge Summary (Signed)
Physician Discharge Summary   Patient: Amy Sawyer MRN: [id number] DOB: [birth year]  Admit date:     07/07/2022  Discharge date: 07/08/22  Discharge Physician: Edwin Dada   PCP: Asencion Noble, MD     Recommendations at discharge:  Discharged with Hospice Recommend follow up with [facility] ENT for orbital fracture based on goals of care     Discharge Diagnoses: Principal Problem:   Subarachnoid hemorrhage [facility]) Active Problems:   Closed fracture of facial bone ([place])   Facial laceration   Fall at home, initial encounter   Underweight   Failure to thrive in adult   Dementia [facility])   Depression   Left upper lobe pulmonary nodule   Hypokalemia   Severe protein calorie malnutrition      Hospital Course: Amy Sawyer is an 88 y.o. F with dementia, failure to thrive and weight loss who presented with a fall.  Had been declining since her husband died.  Recently fell, then on day of admission, fell again, striking her head, with a severe bruise to her eye.  In the ER, [facility] showed small bilateral traumatic SAHs and an orbital bone fracture, nondisplaced.    Subarachnoid hemorrhage Repeat CT showed stability.    Discussed goals of care with family, who wish for palliation. Patient explicity asked to be allowed to "die at home".    In light of that and family's strong support and close access to Hospice, (she is already enrolled in palliative care), I would recommend discharge to hospice with expected days to weeks prognosis.    Orbital bone fracture No ENT available for in hospital evaluation.  Discussed with ENT, Dr. Fredric Dine, these orbital fractures are nonoperative.  She may call [facility] for follow up as desired given her mobility and goals of care.   Lung mass A 3cm spiculated LUL lung mass was noted incidentally on imaging of the spine and visualized more clearly on dedicated CT.  This was discussed with family.  Given patient's expressed wishes to  me, and confirmed by family, who understand this may be cancer, there would be no benefit to come from bronchoscpy or core biopsy and certainly chemotherapy or surgery would not further her current goals of care.  Recommend Hospice.  Failure to thrive Severe protein calorie malnutrition As evidenced by likely cancer, temporal wasting, thenar wasting and severe loss of subcutaneous muscle mass and fat.  Hypokalemia Supplemented in the hospital IV.  Unable to tolerate PO well.  Asymptomatic, and given prognosis, and expressed wishes to pass at home, I do not think further hospital level care will further her goals of care.             The [facility] Controlled Substances Registry was reviewed for this patient prior to discharge.   Consultants: None Procedures performed: CT head, repeat CT head  Disposition: Hospice care   DISCHARGE MEDICATION: Allergies as of 07/08/2022       Reactions   Demerol [meperidine] Anaphylaxis   Hydrocodone-acetaminophen Nausea And Vomiting        Medication List     STOP taking these medications    aspirin EC 81 MG tablet       TAKE these medications    acetaminophen 500 MG tablet Commonly known as: TYLENOL Take 500 mg by mouth every 6 (six) hours as needed for mild pain or headache.   alendronate 70 MG tablet Commonly known as: FOSAMAX Take 70 mg by mouth once a week.   clonazePAM  0.5 MG tablet Commonly known as: KLONOPIN Take 0.5-1 mg by mouth daily.   dicyclomine 10 MG capsule Commonly known as: BENTYL Take 10 mg by mouth 3 (three) times daily.   donepezil 5 MG tablet Commonly known as: ARICEPT Take 5 mg by mouth daily.   feeding supplement Liqd Take 237 mLs by mouth 3 (three) times daily with meals.   lisinopril 40 MG tablet Commonly known as: ZESTRIL Take 1 tablet (40 mg total) by mouth daily. (FORMULARY SUB FOR RAMIPRIL 10MG )   multivitamin tablet Take 1 tablet by mouth daily.   oxybutynin 5 MG  tablet Commonly known as: DITROPAN Take 5 mg by mouth at bedtime.   potassium chloride 10 MEQ tablet Commonly known as: KLOR-CON M Take 1 tablet (10 mEq total) by mouth at bedtime.   sertraline 100 MG tablet Commonly known as: ZOLOFT Take 100 mg by mouth daily.   trolamine salicylate 10 % cream Commonly known as: ASPERCREME Apply 1 application topically 2 (two) times daily as needed for muscle pain.        Follow-up Information     [facility] Follow up.   Contact information: [address] [place] [address] [phone]                 Discharge Instructions     Discharge instructions   Complete by: As directed    Resume your home medicines but STOP aspirin  For the next week, take potassium 10 mEq at night, if mother able to tolerate  Follow up should primarily be with [facility]   Increase activity slowly   Complete by: As directed    No wound care   Complete by: As directed        Discharge Exam: Filed Weights   07/07/22 1658 07/07/22 2245  Weight: 39.5 kg 40.1 kg    General: Pt is alert, awake, very hard of hearing, not in acute distress, left eye bruise, the globe appears normal Cardiovascular: RRR, nl S1-S2, no murmurs appreciated.   No LE edema.   Respiratory: Normal respiratory rate and rhythm.  CTAB without rales or wheezes. Abdominal: Abdomen soft and non-tender.  No distension or HSM.   Neuro/Psych: Strength symmetric in upper and lower extremities but globally weak.  Judgment and insight appear at baseline.   Condition at discharge: stable  The results of significant diagnostics from this hospitalization (including imaging, microbiology, ancillary and laboratory) are listed below for reference.   Imaging Studies: CT HEAD WO CONTRAST (5MM)  Result Date: 07/08/2022 CLINICAL DATA:  Follow-up intracranial hemorrhage. EXAM: CT HEAD WITHOUT CONTRAST TECHNIQUE: Contiguous  axial images were obtained from the base of the skull through the vertex without intravenous contrast. RADIATION DOSE REDUCTION: This exam was performed according to the departmental dose-optimization program which includes automated exposure control, adjustment of the mA and/or kV according to patient size and/or use of iterative reconstruction technique. COMPARISON:  Yesterday FINDINGS: Brain: Subarachnoid hemorrhage along the cerebral convexities, thickened along the anterior right frontal lobe. No new site of hemorrhage. No midline shift. No hydrocephalus Brain atrophy with chronic small vessel ischemia. Remote left lateral frontal cortex infarcts. Vascular: No hyperdense vessel or unexpected calcification. Skull: Partially covered left orbital floor fracture, known from prior. Sinuses/Orbits: Left maxillary hemosinus, partially covered. Opacification in the left sphenoid sinus appears inflammatory. IMPRESSION: Patchy subarachnoid hemorrhage with traumatic pattern. Mild progression at the right frontal convexity but no worrisome mass effect. Electronically  Signed   By: Jorje Guild M.D.   On: 07/08/2022 07:07   CT Chest Wo Contrast  Result Date: 07/07/2022 CLINICAL DATA:  Lung nodule.  Fall. EXAM: CT CHEST WITHOUT CONTRAST TECHNIQUE: Multidetector CT imaging of the chest was performed following the standard protocol without IV contrast. RADIATION DOSE REDUCTION: This exam was performed according to the departmental dose-optimization program which includes automated exposure control, adjustment of the mA and/or kV according to patient size and/or use of iterative reconstruction technique. COMPARISON:  Chest x-ray 07/07/2022.  CT chest 06/07/2004. FINDINGS: Cardiovascular: Heart is mildly enlarged. There is no pericardial effusion. Aorta is normal in size. There are severe atherosclerotic calcifications throughout the aorta. Mediastinum/Nodes: No enlarged mediastinal or axillary lymph nodes. Thyroid gland,  trachea, and esophagus demonstrate no significant findings. Lungs/Pleura: Moderate emphysematous changes have progressed. New focal rounded nodular density seen in the medial left upper lobe abutting the mediastinum measuring 3.4 x 2.4 x 2.7 cm. Margins are slightly spiculated. Ground-glass opacities in the lung bases are favored is atelectasis. No pleural effusion or pneumothorax. Upper Abdomen: There is a 3.5 cm cyst in the superior pole the right kidney. There are multiple left renal cysts the largest measuring 5.8 cm. There is a subcentimeter hyperdense area in the left kidney, most likely a proteinaceous cyst. Musculoskeletal: There is levoconvex curvature of the thoracic spine. Vertebral body hemangioma seen at T7. No acute fractures are seen. IMPRESSION: 1. New 3.4 cm nodular density in the left upper lobe abutting the mediastinum. Findings are highly suspicious for neoplasm. Per Fleischner Society Guidelines, recommend a PET/CT or tissue sampling. These guidelines do not apply to immunocompromised patients and patients with cancer. Follow up in patients with significant comorbidities as clinically warranted. For lung cancer screening, adhere to Lung-RADS guidelines. Reference: Radiology. 2017; 284(1):228-43. 2. No evidence for metastatic disease in the chest. 3. Moderate emphysema. 4. Mild cardiomegaly. 5. Bilateral renal cysts. 6. Multiple lesions, including left Bosniak II benign renal cyst measuring 5.8 cm. No follow-up imaging is recommended. JACR 2018 Feb; 264-273, Management of the Incidental Renal Mass on CT, RadioGraphics 2021; 814-848, Bosniak Classification of Cystic Renal Masses, Version 2019. Aortic Atherosclerosis (ICD10-I70.0) and Emphysema (ICD10-J43.9). Electronically Signed   By: Ronney Asters M.D.   On: 07/07/2022 20:46   CT Cervical Spine Wo Contrast  Result Date: 07/07/2022 CLINICAL DATA:  Cervical spine CT 06/07/2004 EXAM: CT CERVICAL SPINE WITHOUT CONTRAST TECHNIQUE: Multidetector  CT imaging of the cervical spine was performed without intravenous contrast. Multiplanar CT image reconstructions were also generated. RADIATION DOSE REDUCTION: This exam was performed according to the departmental dose-optimization program which includes automated exposure control, adjustment of the mA and/or kV according to patient size and/or use of iterative reconstruction technique. COMPARISON:  None Available. FINDINGS: Alignment: There is degenerative grade 1 anterolisthesis at C4-C5, C7-T1, and T1-T2. Skull base and vertebrae: There is no acute cervical spine fracture. No aggressive osseous lesion. Soft tissues and spinal canal: No prevertebral soft tissue swelling. Disc levels: There is multilevel degenerative disc disease, severe at C4-C5. There is multilevel facet arthropathy bilaterally, severe on the left at C3-C4 and C4-C5. There is bony fusion of the right-sided C7-T1 facets into a lesser degree on the left. Upper chest: There is a partially visualized mass-like consolidation in the medial left apex measuring approximately 4.3 x 2.6 x 3.1 cm (series 3, image 72, series 6, image 38). There is centrilobular emphysema and interlobular septal thickening. Other: None. IMPRESSION: No acute cervical spine fracture. Multilevel degenerative disc  disease of set arthropathy. Mass-like consolidation in the medial left apex measuring 4.3 x 2.6 x 3.1 cm, partially visualized. Recommend chest CT. Electronically Signed   By: Maurine Simmering M.D.   On: 07/07/2022 18:36   CT Head Wo Contrast  Result Date: 07/07/2022 CLINICAL DATA:  Head trauma. EXAM: CT HEAD WITHOUT CONTRAST TECHNIQUE: Contiguous axial images were obtained from the base of the skull through the vertex without intravenous contrast. RADIATION DOSE REDUCTION: This exam was performed according to the departmental dose-optimization program which includes automated exposure control, adjustment of the mA and/or kV according to patient size and/or use of  iterative reconstruction technique. COMPARISON:  CT head 07/17/2020. FINDINGS: Brain: There is a small amount of acute subarachnoid hemorrhage in the anterior frontal lobes, right greater than left, and left insular region. There is no surrounding edema or mass effect. No extra-axial fluid collection or midline shift. No hydrocephalus. Small old left frontoparietal cortical infarct is unchanged. Gray-white matter distinction is otherwise preserved. There is stable mild diffuse atrophy and mild periventricular white matter hypodensity, likely chronic small vessel ischemic change. Vascular: Atherosclerotic calcifications are present within the cavernous internal carotid arteries. Skull: There is acute fracture of the left inferior orbital wall the adjacent postseptal orbital hemorrhage and air. Fracture appears nondisplaced. There is also acute nondisplaced fracture of the lateral wall of the left orbit. There also acute fractures of the anterior inferior and lateral left maxillary sinus walls. Sinuses/Orbits: There is hemorrhage in the left maxillary and left sphenoid sinus. Other: Globes appear intact bilaterally. There is preseptal left orbital soft tissue swelling with hematoma. IMPRESSION: 1. Small amount of acute subarachnoid hemorrhage in the anterior frontal lobes, right greater than left, and left insular region. No surrounding edema or mass effect. 2. Acute fractures of the left inferior and lateral orbital walls and left maxillary sinus walls. 3. Preseptal left orbital soft tissue swelling with hematoma. Electronically Signed   By: Ronney Asters M.D.   On: 07/07/2022 18:32   DG Hand Complete Left  Result Date: 07/07/2022 CLINICAL DATA:  Bruising and swelling after fall EXAM: LEFT HAND - COMPLETE 3+ VIEW COMPARISON:  None Available. FINDINGS: Frontal, oblique, lateral views of the left hand are obtained. The bones are diffusely osteopenic. Mild diffuse interphalangeal joint space narrowing consistent  with osteoarthritis crotch that there is multifocal osteoarthritis, with joint space narrowing throughout the interphalangeal and metacarpophalangeal joints, as well as within the radial aspect of the carpus. No acute fracture, subluxation, or dislocation. Soft tissues are unremarkable. IMPRESSION: 1. Diffuse osteopenia.  No acute displaced fracture. 2. Multifocal osteoarthritis. Electronically Signed   By: Randa Ngo M.D.   On: 07/07/2022 17:55   DG Tibia/Fibula Left  Result Date: 07/07/2022 CLINICAL DATA:  Tripped and fell, bruising EXAM: LEFT TIBIA AND FIBULA - 2 VIEW COMPARISON:  None Available. FINDINGS: Frontal and lateral views of the left tibia and fibula are obtained. Bones are diffusely osteopenic. There are no acute displaced fractures. Alignment of the left knee and ankle is anatomic. Mild left knee and ankle osteoarthritis. Soft tissues are unremarkable. IMPRESSION: 1. Diffuse osteopenia.  No acute fracture. Electronically Signed   By: Randa Ngo M.D.   On: 07/07/2022 17:54   DG Hip Unilat W or Wo Pelvis 2-3 Views Left  Result Date: 07/07/2022 CLINICAL DATA:  [place], pain EXAM: DG HIP (WITH OR WITHOUT PELVIS) 2-3V LEFT COMPARISON:  05/31/2021 FINDINGS: Frontal view of the pelvis as well as frontal and cross-table lateral views of the left  hip are obtained. Postsurgical changes are seen from prior ORIF of the left femur. The distal margins of the femoral intramedullary rod are excluded by collimation. I do not see any evidence of acute displaced fracture. No right hip fractures. Stable mild bilateral hip osteoarthritis. The bones are diffusely osteopenic. The remainder of the bony pelvis is unremarkable. IMPRESSION: 1. Sequela from chronic left hip fracture status post ORIF. No evidence of superimposed acute injury. 2. Diffuse osteopenia.  No acute displaced pelvic fracture. Electronically Signed   By: Randa Ngo M.D.   On: 07/07/2022 17:53   DG Chest Portable 1 View  Result Date:  07/07/2022 CLINICAL DATA:  Tripped and fell, bruising EXAM: PORTABLE CHEST 1 VIEW COMPARISON:  01/19/2021 FINDINGS: Single frontal view of the chest was obtained with the patient rotated toward the right. The cardiac silhouette is unremarkable. Stable ectasia and atherosclerosis of the thoracic aorta. Diffuse increased interstitial prominence most consistent with chronic scarring. No acute airspace disease, effusion, or pneumothorax. There are no acute displaced fractures. IMPRESSION: 1. Stable chest, no acute process. Electronically Signed   By: Randa Ngo M.D.   On: 07/07/2022 17:51    Microbiology: Results for orders placed or performed during the hospital encounter of 01/18/21  Resp Panel by RT-PCR (Flu A&B, Covid) Nasopharyngeal Swab     Status: None   Collection Time: 01/18/21 11:53 PM   Specimen: Nasopharyngeal Swab; Nasopharyngeal(NP) swabs in vial transport medium  Result Value Ref Range Status   SARS Coronavirus 2 by RT PCR NEGATIVE NEGATIVE Final    Comment: (NOTE) SARS-CoV-2 target nucleic acids are NOT DETECTED.  The SARS-CoV-2 RNA is generally detectable in upper respiratory specimens during the acute phase of infection. The lowest concentration of SARS-CoV-2 viral copies this assay can detect is 138 copies/mL. A negative result does not preclude SARS-Cov-2 infection and should not be used as the sole basis for treatment or other patient management decisions. A negative result may occur with  improper specimen collection/handling, submission of specimen other than nasopharyngeal swab, presence of viral mutation(s) within the areas targeted by this assay, and inadequate number of viral copies(<138 copies/mL). A negative result must be combined with clinical observations, patient history, and epidemiological information. The expected result is Negative.  Fact Sheet for Patients:  EntrepreneurPulse.com.au  Fact Sheet for Healthcare Providers:   IncredibleEmployment.be  This test is no t yet approved or cleared by the [place] FDA and  has been authorized for detection and/or diagnosis of SARS-CoV-2 by FDA under an Emergency Use Authorization (EUA). This EUA will remain  in effect (meaning this test can be used) for the duration of the COVID-19 declaration under Section 564(b)(1) of the Act, 21 U.S.C.section 360bbb-3(b)(1), unless the authorization is terminated  or revoked sooner.       Influenza A by PCR NEGATIVE NEGATIVE Final   Influenza B by PCR NEGATIVE NEGATIVE Final    Comment: (NOTE) The Xpert Xpress SARS-CoV-2/FLU/RSV plus assay is intended as an aid in the diagnosis of influenza from Nasopharyngeal swab specimens and should not be used as a sole basis for treatment. Nasal washings and aspirates are unacceptable for Xpert Xpress SARS-CoV-2/FLU/RSV testing.  Fact Sheet for Patients: EntrepreneurPulse.com.au  Fact Sheet for Healthcare Providers: IncredibleEmployment.be  This test is not yet approved or cleared by the [place] FDA and has been authorized for detection and/or diagnosis of SARS-CoV-2 by FDA under an Emergency Use Authorization (EUA). This EUA will remain in effect (meaning this test can be used) for  the duration of the COVID-19 declaration under Section 564(b)(1) of the Act, 21 U.S.C. section 360bbb-3(b)(1), unless the authorization is terminated or revoked.  Performed at [facility], [address], [place], [place] [address]   Surgical pcr screen     Status: None   Collection Time: 01/19/21 12:02 PM   Specimen: Nasal Mucosa; Nasal Swab  Result Value Ref Range Status   MRSA, PCR NEGATIVE NEGATIVE Final   Staphylococcus aureus NEGATIVE NEGATIVE Final    Comment: (NOTE) The Xpert SA Assay (FDA approved for NASAL specimens in patients 6 years of age and older), is one component of a comprehensive surveillance program. It is not  intended to diagnose infection nor to guide or monitor treatment. Performed at [facility], [address], [place], [place] [address]     Labs: CBC: Recent Labs  Lab 07/07/22 1756 07/08/22 0411  WBC 7.6 8.8  HGB 12.0 10.3*  HCT 37.2 31.7*  MCV 93.2 92.2  PLT 209 476   Basic Metabolic Panel: Recent Labs  Lab 07/07/22 1756 07/08/22 0411  NA 139 141  K 3.2* 2.8*  CL 102 102  CO2 32 31  GLUCOSE 114* 112*  BUN 17 17  CREATININE 0.92 0.71  CALCIUM 8.9 8.4*  MG  --  1.9  PHOS  --  3.1   Liver Function Tests: Recent Labs  Lab 07/08/22 0411  AST 23  ALT 15  ALKPHOS 39  BILITOT 0.6  PROT 5.8*  ALBUMIN 3.2*   CBG: No results for input(s): "GLUCAP" in the last 168 hours.  Discharge time spent: approximately 35 minutes spent on discharge counseling, evaluation of patient on day of discharge, and coordination of discharge planning with nursing, social work, pharmacy and case management  Signed: Edwin Dada, MD [facility] 07/08/2022

## 2022-07-08 NOTE — Plan of Care (Signed)
  Problem: Acute Rehab PT Goals(only PT should resolve) Goal: Pt Will Go Supine/Side To Sit Outcome: Progressing Flowsheets (Taken 07/08/2022 1221) Pt will go Supine/Side to Sit:  with minimal assist  with moderate assist Goal: Patient Will Transfer Sit To/From Stand Outcome: Progressing Flowsheets (Taken 07/08/2022 1221) Patient will transfer sit to/from stand:  with minimal assist  with moderate assist Goal: Pt Will Transfer Bed To Chair/Chair To Bed Outcome: Progressing Flowsheets (Taken 07/08/2022 1221) Pt will Transfer Bed to Chair/Chair to Bed:  with mod assist  with min assist Goal: Pt Will Ambulate Outcome: Progressing Flowsheets (Taken 07/08/2022 1221) Pt will Ambulate:  10 feet  with moderate assist  with rolling walker   12:22 PM, 07/08/22 Lonell Grandchild, MPT Physical Therapist with [facility] [phone] office [phone] mobile phone

## 2022-07-09 DIAGNOSIS — G311 Senile degeneration of brain, not elsewhere classified: Secondary | ICD-10-CM | POA: Diagnosis not present

## 2022-07-09 DIAGNOSIS — E785 Hyperlipidemia, unspecified: Secondary | ICD-10-CM | POA: Diagnosis not present

## 2022-07-09 DIAGNOSIS — I10 Essential (primary) hypertension: Secondary | ICD-10-CM | POA: Diagnosis not present

## 2022-07-09 DIAGNOSIS — I6789 Other cerebrovascular disease: Secondary | ICD-10-CM | POA: Diagnosis not present

## 2022-07-14 DIAGNOSIS — S0232XA Fracture of orbital floor, left side, initial encounter for closed fracture: Secondary | ICD-10-CM | POA: Diagnosis not present

## 2022-07-14 DIAGNOSIS — C3492 Malignant neoplasm of unspecified part of left bronchus or lung: Secondary | ICD-10-CM | POA: Diagnosis not present

## 2022-07-14 DIAGNOSIS — I609 Nontraumatic subarachnoid hemorrhage, unspecified: Secondary | ICD-10-CM | POA: Diagnosis not present

## 2022-07-14 DIAGNOSIS — E441 Mild protein-calorie malnutrition: Secondary | ICD-10-CM | POA: Diagnosis not present

## 2022-07-25 DIAGNOSIS — G311 Senile degeneration of brain, not elsewhere classified: Secondary | ICD-10-CM | POA: Diagnosis not present

## 2022-07-25 DIAGNOSIS — G8929 Other chronic pain: Secondary | ICD-10-CM | POA: Diagnosis not present

## 2022-07-25 DIAGNOSIS — I10 Essential (primary) hypertension: Secondary | ICD-10-CM | POA: Diagnosis not present

## 2022-07-25 DIAGNOSIS — F32A Depression, unspecified: Secondary | ICD-10-CM | POA: Diagnosis not present

## 2022-07-25 DIAGNOSIS — E785 Hyperlipidemia, unspecified: Secondary | ICD-10-CM | POA: Diagnosis not present

## 2022-07-25 DIAGNOSIS — R296 Repeated falls: Secondary | ICD-10-CM | POA: Diagnosis not present

## 2022-07-25 DIAGNOSIS — I6789 Other cerebrovascular disease: Secondary | ICD-10-CM | POA: Diagnosis not present

## 2022-07-25 DIAGNOSIS — F02A4 Dementia in other diseases classified elsewhere, mild, with anxiety: Secondary | ICD-10-CM | POA: Diagnosis not present

## 2022-07-26 DIAGNOSIS — G311 Senile degeneration of brain, not elsewhere classified: Secondary | ICD-10-CM | POA: Diagnosis not present

## 2022-07-26 DIAGNOSIS — F02A4 Dementia in other diseases classified elsewhere, mild, with anxiety: Secondary | ICD-10-CM | POA: Diagnosis not present

## 2022-07-26 DIAGNOSIS — E785 Hyperlipidemia, unspecified: Secondary | ICD-10-CM | POA: Diagnosis not present

## 2022-07-26 DIAGNOSIS — I10 Essential (primary) hypertension: Secondary | ICD-10-CM | POA: Diagnosis not present

## 2022-07-26 DIAGNOSIS — I6789 Other cerebrovascular disease: Secondary | ICD-10-CM | POA: Diagnosis not present

## 2022-07-26 DIAGNOSIS — G8929 Other chronic pain: Secondary | ICD-10-CM | POA: Diagnosis not present

## 2022-08-02 DIAGNOSIS — I6789 Other cerebrovascular disease: Secondary | ICD-10-CM | POA: Diagnosis not present

## 2022-08-02 DIAGNOSIS — G8929 Other chronic pain: Secondary | ICD-10-CM | POA: Diagnosis not present

## 2022-08-02 DIAGNOSIS — I10 Essential (primary) hypertension: Secondary | ICD-10-CM | POA: Diagnosis not present

## 2022-08-02 DIAGNOSIS — F02A4 Dementia in other diseases classified elsewhere, mild, with anxiety: Secondary | ICD-10-CM | POA: Diagnosis not present

## 2022-08-02 DIAGNOSIS — E785 Hyperlipidemia, unspecified: Secondary | ICD-10-CM | POA: Diagnosis not present

## 2022-08-02 DIAGNOSIS — G311 Senile degeneration of brain, not elsewhere classified: Secondary | ICD-10-CM | POA: Diagnosis not present

## 2022-08-04 DIAGNOSIS — R918 Other nonspecific abnormal finding of lung field: Secondary | ICD-10-CM | POA: Diagnosis not present

## 2022-08-04 DIAGNOSIS — F02A4 Dementia in other diseases classified elsewhere, mild, with anxiety: Secondary | ICD-10-CM | POA: Diagnosis not present

## 2022-08-04 DIAGNOSIS — G311 Senile degeneration of brain, not elsewhere classified: Secondary | ICD-10-CM | POA: Diagnosis not present

## 2022-08-04 DIAGNOSIS — I10 Essential (primary) hypertension: Secondary | ICD-10-CM | POA: Diagnosis not present

## 2022-08-04 DIAGNOSIS — I6789 Other cerebrovascular disease: Secondary | ICD-10-CM | POA: Diagnosis not present

## 2022-08-04 DIAGNOSIS — G8929 Other chronic pain: Secondary | ICD-10-CM | POA: Diagnosis not present

## 2022-08-04 DIAGNOSIS — I609 Nontraumatic subarachnoid hemorrhage, unspecified: Secondary | ICD-10-CM | POA: Diagnosis not present

## 2022-08-04 DIAGNOSIS — E785 Hyperlipidemia, unspecified: Secondary | ICD-10-CM | POA: Diagnosis not present

## 2022-08-05 DIAGNOSIS — G311 Senile degeneration of brain, not elsewhere classified: Secondary | ICD-10-CM | POA: Diagnosis not present

## 2022-08-05 DIAGNOSIS — E785 Hyperlipidemia, unspecified: Secondary | ICD-10-CM | POA: Diagnosis not present

## 2022-08-05 DIAGNOSIS — R296 Repeated falls: Secondary | ICD-10-CM | POA: Diagnosis not present

## 2022-08-05 DIAGNOSIS — G8929 Other chronic pain: Secondary | ICD-10-CM | POA: Diagnosis not present

## 2022-08-05 DIAGNOSIS — F02A4 Dementia in other diseases classified elsewhere, mild, with anxiety: Secondary | ICD-10-CM | POA: Diagnosis not present

## 2022-08-05 DIAGNOSIS — I10 Essential (primary) hypertension: Secondary | ICD-10-CM | POA: Diagnosis not present

## 2022-08-05 DIAGNOSIS — I6789 Other cerebrovascular disease: Secondary | ICD-10-CM | POA: Diagnosis not present

## 2022-08-05 DIAGNOSIS — F32A Depression, unspecified: Secondary | ICD-10-CM | POA: Diagnosis not present

## 2022-08-06 DIAGNOSIS — G311 Senile degeneration of brain, not elsewhere classified: Secondary | ICD-10-CM | POA: Diagnosis not present

## 2022-08-06 DIAGNOSIS — F02A4 Dementia in other diseases classified elsewhere, mild, with anxiety: Secondary | ICD-10-CM | POA: Diagnosis not present

## 2022-08-06 DIAGNOSIS — E785 Hyperlipidemia, unspecified: Secondary | ICD-10-CM | POA: Diagnosis not present

## 2022-08-06 DIAGNOSIS — G8929 Other chronic pain: Secondary | ICD-10-CM | POA: Diagnosis not present

## 2022-08-06 DIAGNOSIS — I10 Essential (primary) hypertension: Secondary | ICD-10-CM | POA: Diagnosis not present

## 2022-08-06 DIAGNOSIS — I6789 Other cerebrovascular disease: Secondary | ICD-10-CM | POA: Diagnosis not present

## 2022-08-10 DIAGNOSIS — E785 Hyperlipidemia, unspecified: Secondary | ICD-10-CM | POA: Diagnosis not present

## 2022-08-10 DIAGNOSIS — G8929 Other chronic pain: Secondary | ICD-10-CM | POA: Diagnosis not present

## 2022-08-10 DIAGNOSIS — G311 Senile degeneration of brain, not elsewhere classified: Secondary | ICD-10-CM | POA: Diagnosis not present

## 2022-08-10 DIAGNOSIS — F02A4 Dementia in other diseases classified elsewhere, mild, with anxiety: Secondary | ICD-10-CM | POA: Diagnosis not present

## 2022-08-10 DIAGNOSIS — I10 Essential (primary) hypertension: Secondary | ICD-10-CM | POA: Diagnosis not present

## 2022-08-10 DIAGNOSIS — I6789 Other cerebrovascular disease: Secondary | ICD-10-CM | POA: Diagnosis not present

## 2022-08-11 DIAGNOSIS — I6789 Other cerebrovascular disease: Secondary | ICD-10-CM | POA: Diagnosis not present

## 2022-08-11 DIAGNOSIS — F02A4 Dementia in other diseases classified elsewhere, mild, with anxiety: Secondary | ICD-10-CM | POA: Diagnosis not present

## 2022-08-11 DIAGNOSIS — G311 Senile degeneration of brain, not elsewhere classified: Secondary | ICD-10-CM | POA: Diagnosis not present

## 2022-08-11 DIAGNOSIS — I10 Essential (primary) hypertension: Secondary | ICD-10-CM | POA: Diagnosis not present

## 2022-08-11 DIAGNOSIS — G8929 Other chronic pain: Secondary | ICD-10-CM | POA: Diagnosis not present

## 2022-08-11 DIAGNOSIS — E785 Hyperlipidemia, unspecified: Secondary | ICD-10-CM | POA: Diagnosis not present

## 2022-08-12 DIAGNOSIS — E785 Hyperlipidemia, unspecified: Secondary | ICD-10-CM | POA: Diagnosis not present

## 2022-08-12 DIAGNOSIS — I10 Essential (primary) hypertension: Secondary | ICD-10-CM | POA: Diagnosis not present

## 2022-08-12 DIAGNOSIS — I6789 Other cerebrovascular disease: Secondary | ICD-10-CM | POA: Diagnosis not present

## 2022-08-12 DIAGNOSIS — G8929 Other chronic pain: Secondary | ICD-10-CM | POA: Diagnosis not present

## 2022-08-12 DIAGNOSIS — F02A4 Dementia in other diseases classified elsewhere, mild, with anxiety: Secondary | ICD-10-CM | POA: Diagnosis not present

## 2022-08-12 DIAGNOSIS — G311 Senile degeneration of brain, not elsewhere classified: Secondary | ICD-10-CM | POA: Diagnosis not present

## 2022-08-15 DIAGNOSIS — I6789 Other cerebrovascular disease: Secondary | ICD-10-CM | POA: Diagnosis not present

## 2022-08-15 DIAGNOSIS — F02A4 Dementia in other diseases classified elsewhere, mild, with anxiety: Secondary | ICD-10-CM | POA: Diagnosis not present

## 2022-08-15 DIAGNOSIS — G311 Senile degeneration of brain, not elsewhere classified: Secondary | ICD-10-CM | POA: Diagnosis not present

## 2022-08-15 DIAGNOSIS — G8929 Other chronic pain: Secondary | ICD-10-CM | POA: Diagnosis not present

## 2022-08-15 DIAGNOSIS — I10 Essential (primary) hypertension: Secondary | ICD-10-CM | POA: Diagnosis not present

## 2022-08-15 DIAGNOSIS — E785 Hyperlipidemia, unspecified: Secondary | ICD-10-CM | POA: Diagnosis not present

## 2022-08-17 DIAGNOSIS — I10 Essential (primary) hypertension: Secondary | ICD-10-CM | POA: Diagnosis not present

## 2022-08-17 DIAGNOSIS — F02A4 Dementia in other diseases classified elsewhere, mild, with anxiety: Secondary | ICD-10-CM | POA: Diagnosis not present

## 2022-08-17 DIAGNOSIS — G311 Senile degeneration of brain, not elsewhere classified: Secondary | ICD-10-CM | POA: Diagnosis not present

## 2022-08-17 DIAGNOSIS — I6789 Other cerebrovascular disease: Secondary | ICD-10-CM | POA: Diagnosis not present

## 2022-08-17 DIAGNOSIS — E785 Hyperlipidemia, unspecified: Secondary | ICD-10-CM | POA: Diagnosis not present

## 2022-08-17 DIAGNOSIS — G8929 Other chronic pain: Secondary | ICD-10-CM | POA: Diagnosis not present

## 2022-08-18 DIAGNOSIS — I6789 Other cerebrovascular disease: Secondary | ICD-10-CM | POA: Diagnosis not present

## 2022-08-18 DIAGNOSIS — G311 Senile degeneration of brain, not elsewhere classified: Secondary | ICD-10-CM | POA: Diagnosis not present

## 2022-08-18 DIAGNOSIS — G8929 Other chronic pain: Secondary | ICD-10-CM | POA: Diagnosis not present

## 2022-08-18 DIAGNOSIS — E785 Hyperlipidemia, unspecified: Secondary | ICD-10-CM | POA: Diagnosis not present

## 2022-08-18 DIAGNOSIS — F02A4 Dementia in other diseases classified elsewhere, mild, with anxiety: Secondary | ICD-10-CM | POA: Diagnosis not present

## 2022-08-18 DIAGNOSIS — I10 Essential (primary) hypertension: Secondary | ICD-10-CM | POA: Diagnosis not present

## 2022-08-19 IMAGING — DX DG CHEST 1V PORT
1 series · 1 of 1 positions shown · non-contrast
Comparison: 02/19/2018

CLINICAL DATA: Weakness

EXAM:
PORTABLE CHEST 1 VIEW

[chest]
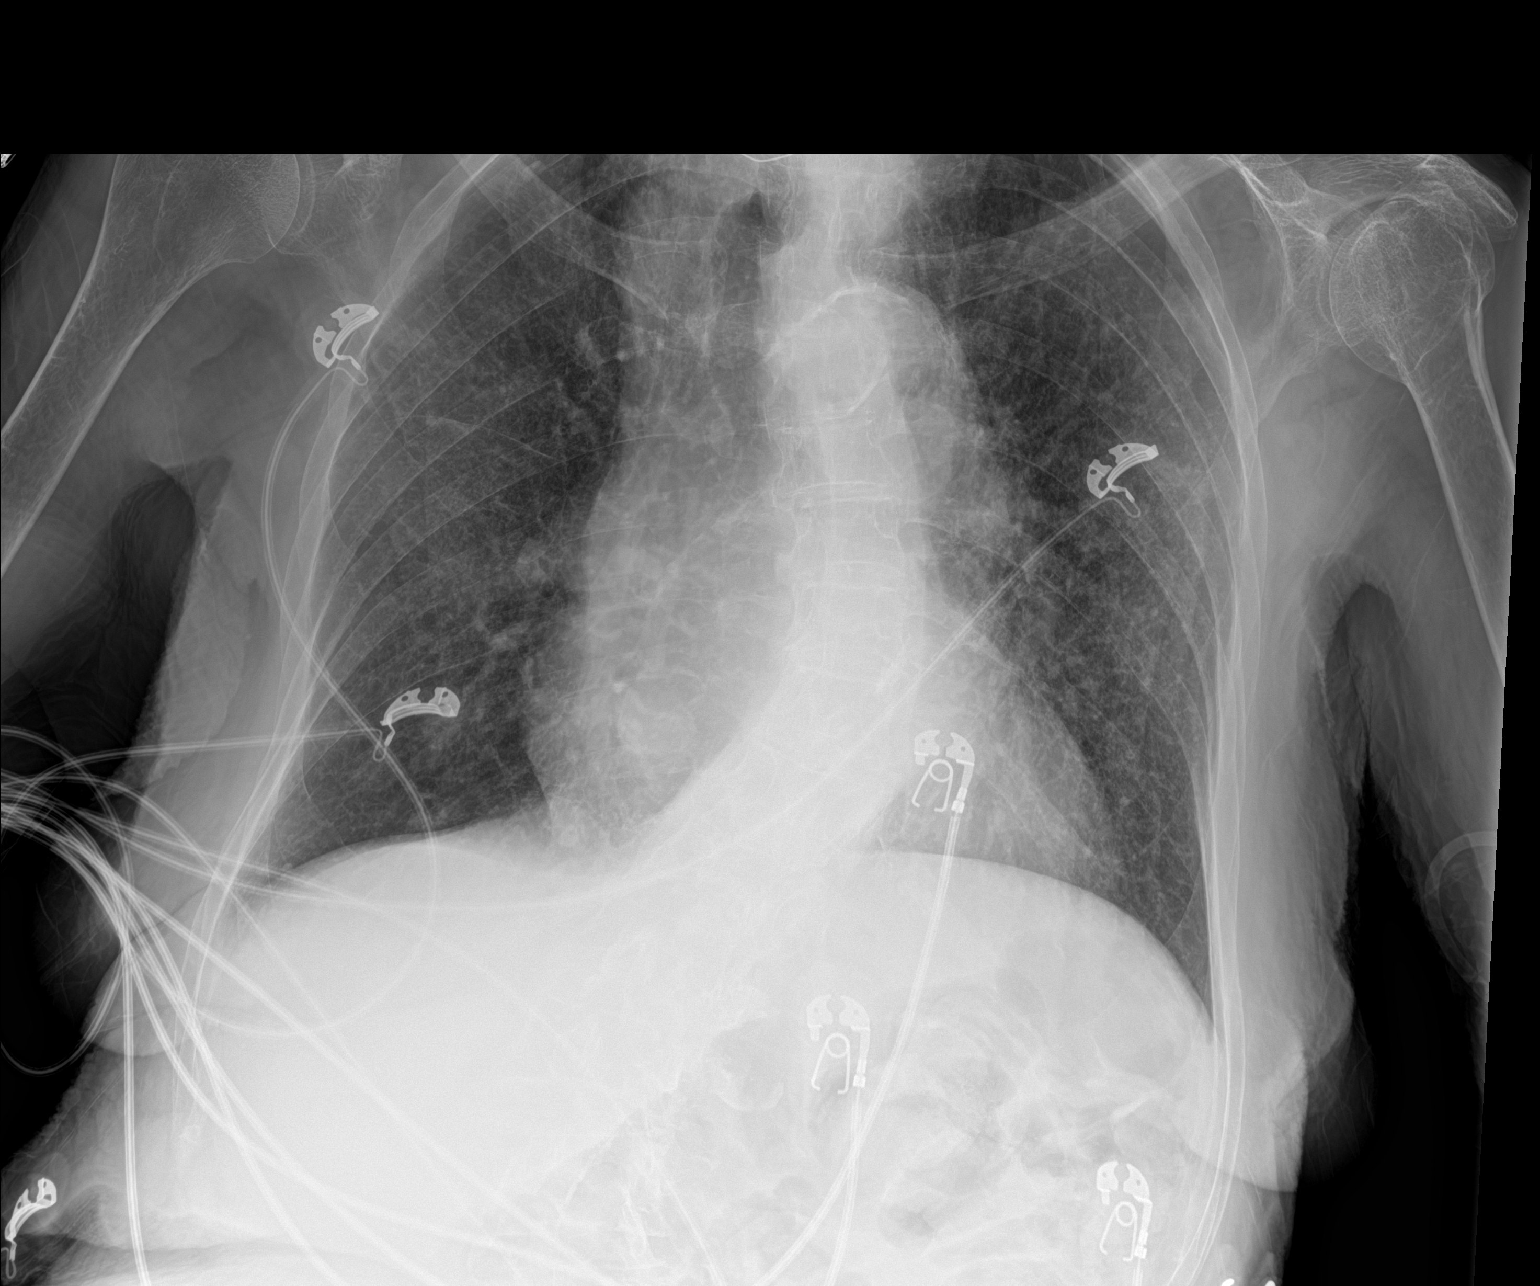

[1 of 1 positions shown; findings below may reference images not displayed]

FINDINGS: Cardiac shadow is enlarged but stable. Aortic calcifications are
noted. Lungs are well aerated bilaterally. No focal infiltrate or
sizable effusion is seen. No bony abnormality is noted.
IMPRESSION: No acute abnormality seen.

## 2022-08-19 IMAGING — CT CT ABD-PELV W/ CM
2 of 5 series · 16 of 46 positions shown, 18 images · IV contrast (omnipaque)
Comparison: CT 02/19/2018, 03/22/2014

CLINICAL DATA: Right lower quadrant pain malaise

EXAM:
CT ABDOMEN AND PELVIS WITH CONTRAST
TECHNIQUE: Multidetector CT imaging of the abdomen and pelvis was performed
using the standard protocol following bolus administration of
intravenous contrast.
CONTRAST:  100mL OMNIPAQUE IOHEXOL 300 MG/ML  SOLN

[Series 3: a/p w/ 5mm · axial · 0.87mm/px · z∈[+654,+1064]mm · 13 of 92 slices shown, 15 images]
[im 5/92  soft-tissue]
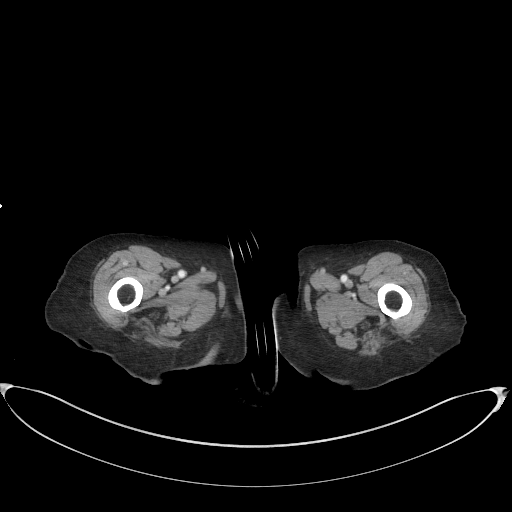
[im 5/92  bone]
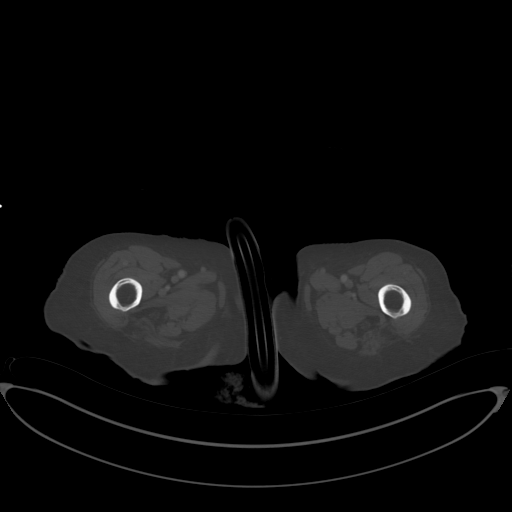
[im 15/92  soft-tissue]
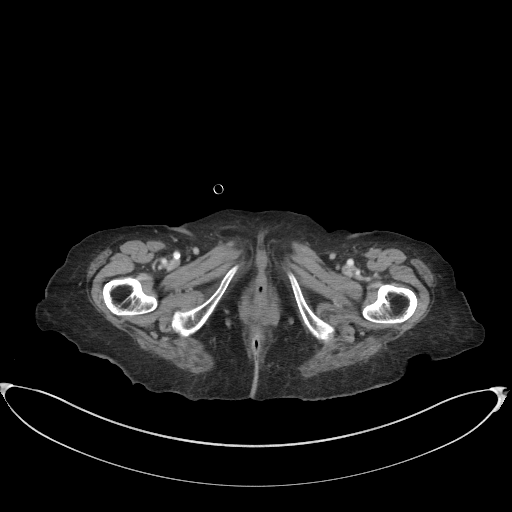
[im 20/92  soft-tissue]
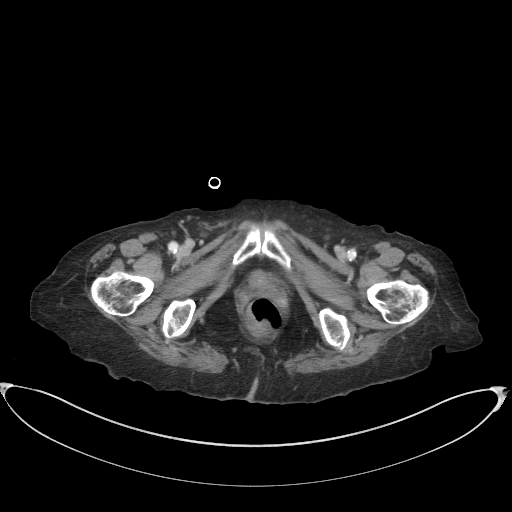
[im 24/92  soft-tissue]
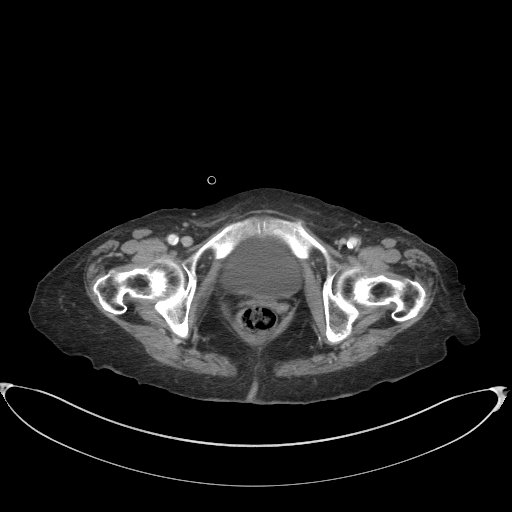
[im 34/92  soft-tissue]
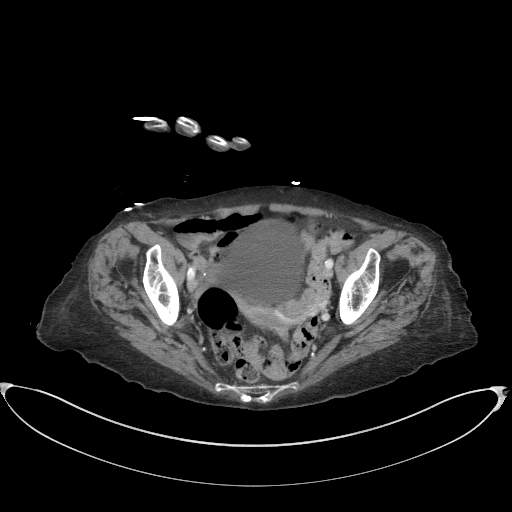
[im 39/92  soft-tissue]
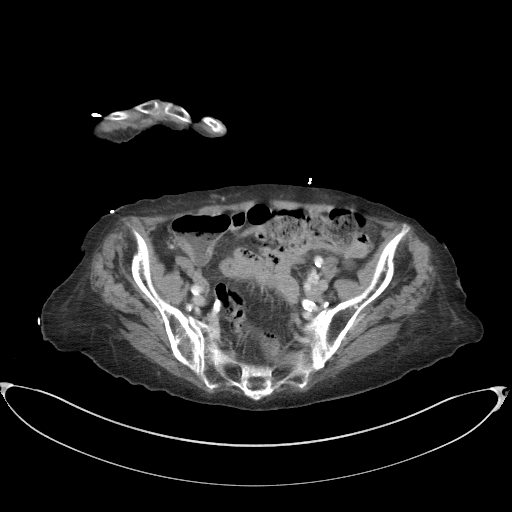
[im 48/92  soft-tissue]
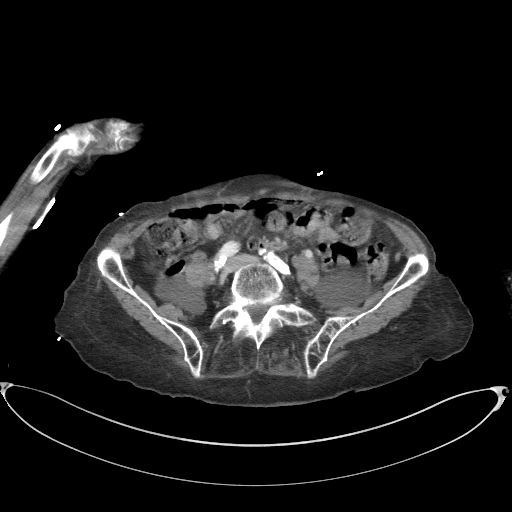
[im 53/92  soft-tissue]
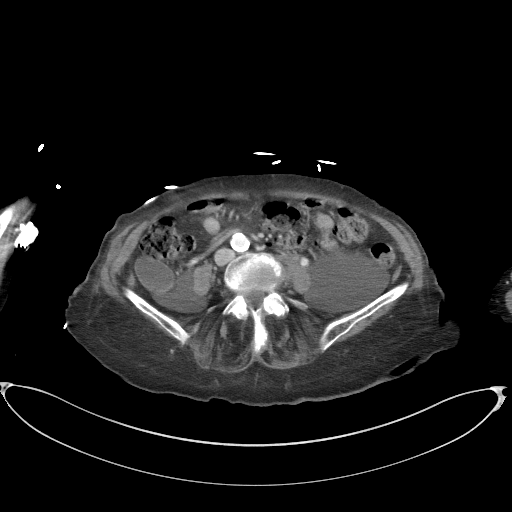
[im 58/92  soft-tissue]
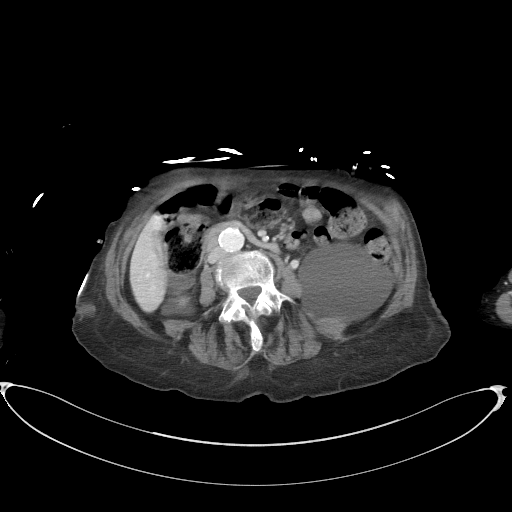
[im 58/92  bone]
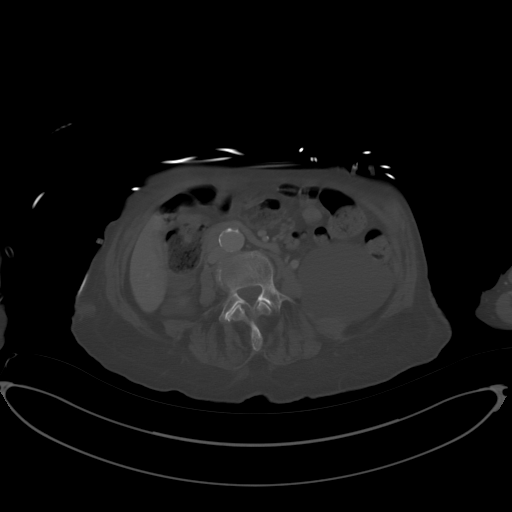
[im 68/92  soft-tissue]
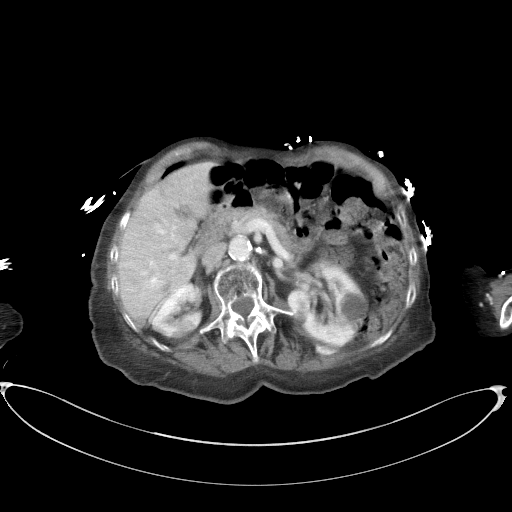
[im 72/92  soft-tissue]
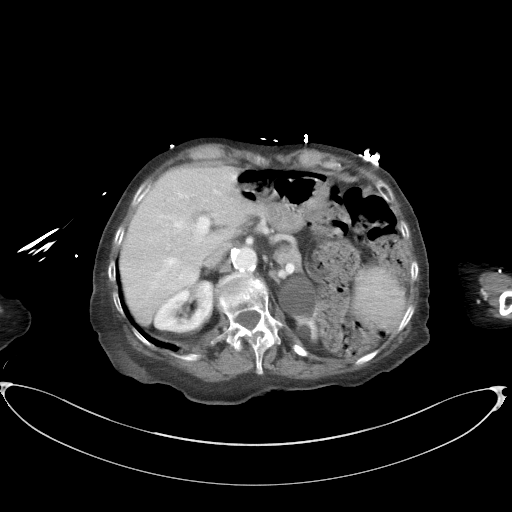
[im 77/92  soft-tissue]
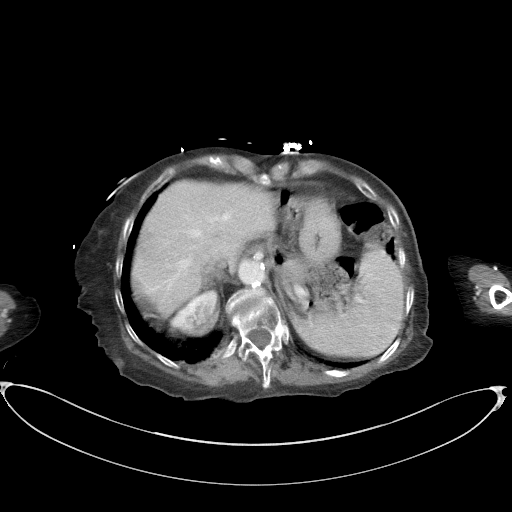
[im 87/92  soft-tissue]
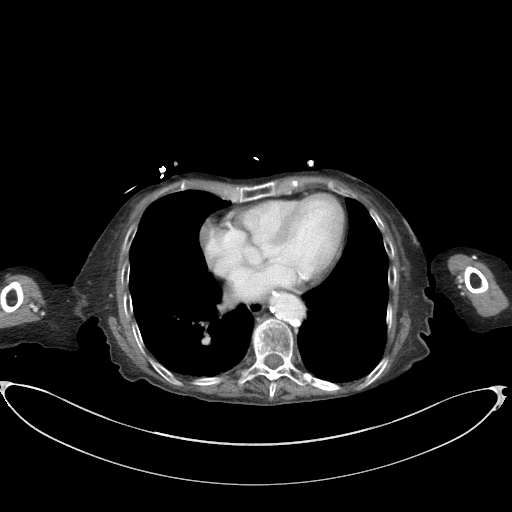

[Series 7: a/p w/ cor · coronal · 0.80mm/px · 3 of 150 slices shown]
[im 50/150  soft-tissue]
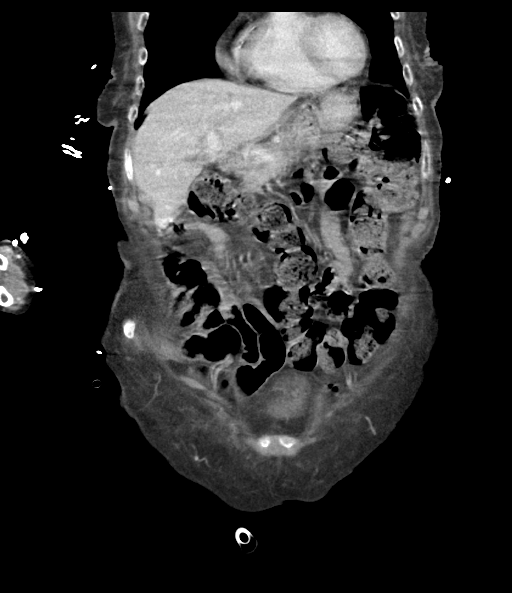
[im 67/150  soft-tissue]
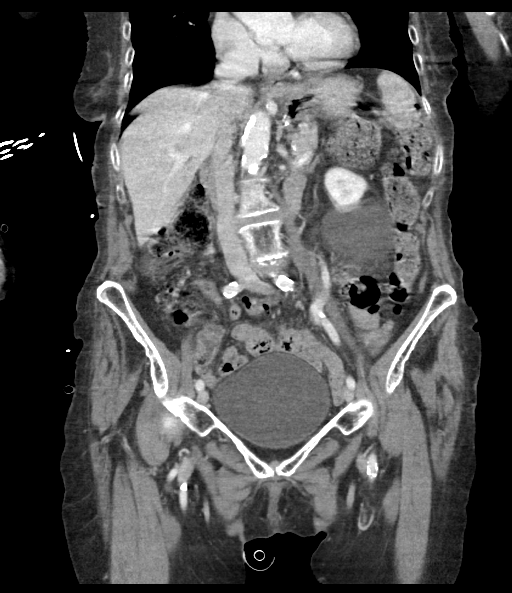
[im 83/150  soft-tissue]
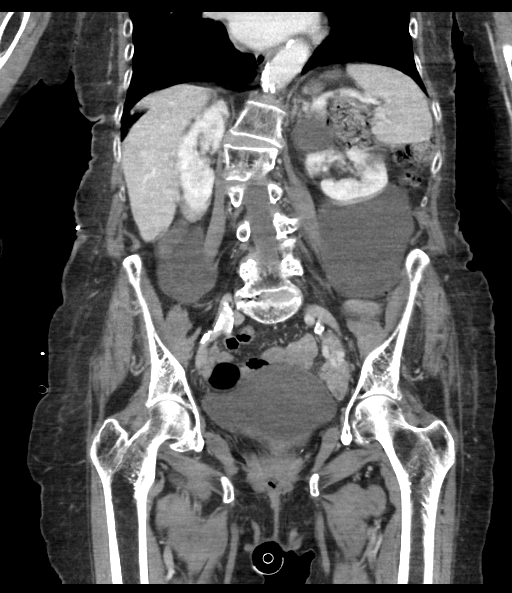

[16 of 46 positions shown; findings below may reference images not displayed]

FINDINGS: Lower chest: Lung bases demonstrate no acute consolidation or
effusion. Mild cardiomegaly.

Hepatobiliary: Multiple subcentimeter hypodense liver lesions too
small to further characterize. No calcified gallstone or biliary
dilatation

Pancreas: Unremarkable. No pancreatic ductal dilatation or
surrounding inflammatory changes.

Spleen: Normal in size without focal abnormality.

Adrenals/Urinary Tract: Adrenal glands are normal. Kidneys show no
hydronephrosis. Multiple cysts within the bilateral kidneys. Large
left greater than right exophytic cysts, on the left, this measures
7.8 by 7.2 cm and on the right measures 6.3 x 4.4 cm. The urinary
bladder is unremarkable.

Stomach/Bowel: The stomach is nonenlarged. No dilated small bowel.
Large amount of stool throughout the colon. No acute bowel wall
thickening. Appendix not identified with certainty but no right
lower quadrant inflammatory process.

Vascular/Lymphatic: Extensive aortic atherosclerosis. No aneurysm.
No suspicious nodes.

Reproductive: Uterus and bilateral adnexa are unremarkable.

Other: Negative for free air or free fluid

Musculoskeletal: Scoliosis and degenerative changes of the spine. No
acute osseous abnormality.
IMPRESSION: 1. No CT evidence for acute intra-abdominal or pelvic abnormality.
2. Large amount of stool throughout the colon suggesting
constipation.
3. Multiple kidney cysts.

Aortic Atherosclerosis ([id number]-CU2.2).

## 2022-08-20 DIAGNOSIS — I10 Essential (primary) hypertension: Secondary | ICD-10-CM | POA: Diagnosis not present

## 2022-08-20 DIAGNOSIS — G8929 Other chronic pain: Secondary | ICD-10-CM | POA: Diagnosis not present

## 2022-08-20 DIAGNOSIS — G311 Senile degeneration of brain, not elsewhere classified: Secondary | ICD-10-CM | POA: Diagnosis not present

## 2022-08-20 DIAGNOSIS — F02A4 Dementia in other diseases classified elsewhere, mild, with anxiety: Secondary | ICD-10-CM | POA: Diagnosis not present

## 2022-08-20 DIAGNOSIS — E785 Hyperlipidemia, unspecified: Secondary | ICD-10-CM | POA: Diagnosis not present

## 2022-08-20 DIAGNOSIS — I6789 Other cerebrovascular disease: Secondary | ICD-10-CM | POA: Diagnosis not present

## 2022-08-25 DIAGNOSIS — E785 Hyperlipidemia, unspecified: Secondary | ICD-10-CM | POA: Diagnosis not present

## 2022-08-25 DIAGNOSIS — G8929 Other chronic pain: Secondary | ICD-10-CM | POA: Diagnosis not present

## 2022-08-25 DIAGNOSIS — G311 Senile degeneration of brain, not elsewhere classified: Secondary | ICD-10-CM | POA: Diagnosis not present

## 2022-08-25 DIAGNOSIS — I10 Essential (primary) hypertension: Secondary | ICD-10-CM | POA: Diagnosis not present

## 2022-08-25 DIAGNOSIS — I6789 Other cerebrovascular disease: Secondary | ICD-10-CM | POA: Diagnosis not present

## 2022-08-25 DIAGNOSIS — F02A4 Dementia in other diseases classified elsewhere, mild, with anxiety: Secondary | ICD-10-CM | POA: Diagnosis not present

## 2022-08-30 DIAGNOSIS — G311 Senile degeneration of brain, not elsewhere classified: Secondary | ICD-10-CM | POA: Diagnosis not present

## 2022-08-30 DIAGNOSIS — I10 Essential (primary) hypertension: Secondary | ICD-10-CM | POA: Diagnosis not present

## 2022-08-30 DIAGNOSIS — G8929 Other chronic pain: Secondary | ICD-10-CM | POA: Diagnosis not present

## 2022-08-30 DIAGNOSIS — F02A4 Dementia in other diseases classified elsewhere, mild, with anxiety: Secondary | ICD-10-CM | POA: Diagnosis not present

## 2022-08-30 DIAGNOSIS — E785 Hyperlipidemia, unspecified: Secondary | ICD-10-CM | POA: Diagnosis not present

## 2022-08-30 DIAGNOSIS — I6789 Other cerebrovascular disease: Secondary | ICD-10-CM | POA: Diagnosis not present

## 2022-09-01 DIAGNOSIS — F02A4 Dementia in other diseases classified elsewhere, mild, with anxiety: Secondary | ICD-10-CM | POA: Diagnosis not present

## 2022-09-01 DIAGNOSIS — G311 Senile degeneration of brain, not elsewhere classified: Secondary | ICD-10-CM | POA: Diagnosis not present

## 2022-09-01 DIAGNOSIS — I6789 Other cerebrovascular disease: Secondary | ICD-10-CM | POA: Diagnosis not present

## 2022-09-01 DIAGNOSIS — E785 Hyperlipidemia, unspecified: Secondary | ICD-10-CM | POA: Diagnosis not present

## 2022-09-01 DIAGNOSIS — G8929 Other chronic pain: Secondary | ICD-10-CM | POA: Diagnosis not present

## 2022-09-01 DIAGNOSIS — I10 Essential (primary) hypertension: Secondary | ICD-10-CM | POA: Diagnosis not present

## 2022-09-05 DIAGNOSIS — G8929 Other chronic pain: Secondary | ICD-10-CM | POA: Diagnosis not present

## 2022-09-05 DIAGNOSIS — I10 Essential (primary) hypertension: Secondary | ICD-10-CM | POA: Diagnosis not present

## 2022-09-05 DIAGNOSIS — I6789 Other cerebrovascular disease: Secondary | ICD-10-CM | POA: Diagnosis not present

## 2022-09-05 DIAGNOSIS — R296 Repeated falls: Secondary | ICD-10-CM | POA: Diagnosis not present

## 2022-09-05 DIAGNOSIS — F32A Depression, unspecified: Secondary | ICD-10-CM | POA: Diagnosis not present

## 2022-09-05 DIAGNOSIS — G311 Senile degeneration of brain, not elsewhere classified: Secondary | ICD-10-CM | POA: Diagnosis not present

## 2022-09-05 DIAGNOSIS — F02A4 Dementia in other diseases classified elsewhere, mild, with anxiety: Secondary | ICD-10-CM | POA: Diagnosis not present

## 2022-09-05 DIAGNOSIS — E785 Hyperlipidemia, unspecified: Secondary | ICD-10-CM | POA: Diagnosis not present

## 2022-09-07 DIAGNOSIS — F325 Major depressive disorder, single episode, in full remission: Secondary | ICD-10-CM | POA: Diagnosis not present

## 2022-09-07 DIAGNOSIS — M810 Age-related osteoporosis without current pathological fracture: Secondary | ICD-10-CM | POA: Diagnosis not present

## 2022-09-07 DIAGNOSIS — F01518 Vascular dementia, unspecified severity, with other behavioral disturbance: Secondary | ICD-10-CM | POA: Diagnosis not present

## 2022-09-08 DIAGNOSIS — E785 Hyperlipidemia, unspecified: Secondary | ICD-10-CM | POA: Diagnosis not present

## 2022-09-08 DIAGNOSIS — I10 Essential (primary) hypertension: Secondary | ICD-10-CM | POA: Diagnosis not present

## 2022-09-08 DIAGNOSIS — G8929 Other chronic pain: Secondary | ICD-10-CM | POA: Diagnosis not present

## 2022-09-08 DIAGNOSIS — G311 Senile degeneration of brain, not elsewhere classified: Secondary | ICD-10-CM | POA: Diagnosis not present

## 2022-09-08 DIAGNOSIS — I6789 Other cerebrovascular disease: Secondary | ICD-10-CM | POA: Diagnosis not present

## 2022-09-08 DIAGNOSIS — F02A4 Dementia in other diseases classified elsewhere, mild, with anxiety: Secondary | ICD-10-CM | POA: Diagnosis not present

## 2022-09-11 DIAGNOSIS — I6789 Other cerebrovascular disease: Secondary | ICD-10-CM | POA: Diagnosis not present

## 2022-09-11 DIAGNOSIS — G311 Senile degeneration of brain, not elsewhere classified: Secondary | ICD-10-CM | POA: Diagnosis not present

## 2022-09-11 DIAGNOSIS — I10 Essential (primary) hypertension: Secondary | ICD-10-CM | POA: Diagnosis not present

## 2022-09-11 DIAGNOSIS — E785 Hyperlipidemia, unspecified: Secondary | ICD-10-CM | POA: Diagnosis not present

## 2022-09-11 DIAGNOSIS — F02A4 Dementia in other diseases classified elsewhere, mild, with anxiety: Secondary | ICD-10-CM | POA: Diagnosis not present

## 2022-09-11 DIAGNOSIS — G8929 Other chronic pain: Secondary | ICD-10-CM | POA: Diagnosis not present

## 2022-09-12 DIAGNOSIS — I6789 Other cerebrovascular disease: Secondary | ICD-10-CM | POA: Diagnosis not present

## 2022-09-12 DIAGNOSIS — I10 Essential (primary) hypertension: Secondary | ICD-10-CM | POA: Diagnosis not present

## 2022-09-12 DIAGNOSIS — G8929 Other chronic pain: Secondary | ICD-10-CM | POA: Diagnosis not present

## 2022-09-12 DIAGNOSIS — F02A4 Dementia in other diseases classified elsewhere, mild, with anxiety: Secondary | ICD-10-CM | POA: Diagnosis not present

## 2022-09-12 DIAGNOSIS — E785 Hyperlipidemia, unspecified: Secondary | ICD-10-CM | POA: Diagnosis not present

## 2022-09-12 DIAGNOSIS — G311 Senile degeneration of brain, not elsewhere classified: Secondary | ICD-10-CM | POA: Diagnosis not present

## 2022-09-15 DIAGNOSIS — E785 Hyperlipidemia, unspecified: Secondary | ICD-10-CM | POA: Diagnosis not present

## 2022-09-15 DIAGNOSIS — G311 Senile degeneration of brain, not elsewhere classified: Secondary | ICD-10-CM | POA: Diagnosis not present

## 2022-09-15 DIAGNOSIS — I10 Essential (primary) hypertension: Secondary | ICD-10-CM | POA: Diagnosis not present

## 2022-09-15 DIAGNOSIS — G8929 Other chronic pain: Secondary | ICD-10-CM | POA: Diagnosis not present

## 2022-09-15 DIAGNOSIS — F02A4 Dementia in other diseases classified elsewhere, mild, with anxiety: Secondary | ICD-10-CM | POA: Diagnosis not present

## 2022-09-15 DIAGNOSIS — I6789 Other cerebrovascular disease: Secondary | ICD-10-CM | POA: Diagnosis not present

## 2022-09-22 DIAGNOSIS — E785 Hyperlipidemia, unspecified: Secondary | ICD-10-CM | POA: Diagnosis not present

## 2022-09-22 DIAGNOSIS — G311 Senile degeneration of brain, not elsewhere classified: Secondary | ICD-10-CM | POA: Diagnosis not present

## 2022-09-22 DIAGNOSIS — F02A4 Dementia in other diseases classified elsewhere, mild, with anxiety: Secondary | ICD-10-CM | POA: Diagnosis not present

## 2022-09-22 DIAGNOSIS — I10 Essential (primary) hypertension: Secondary | ICD-10-CM | POA: Diagnosis not present

## 2022-09-22 DIAGNOSIS — G8929 Other chronic pain: Secondary | ICD-10-CM | POA: Diagnosis not present

## 2022-09-22 DIAGNOSIS — I6789 Other cerebrovascular disease: Secondary | ICD-10-CM | POA: Diagnosis not present

## 2022-09-29 DIAGNOSIS — G311 Senile degeneration of brain, not elsewhere classified: Secondary | ICD-10-CM | POA: Diagnosis not present

## 2022-09-29 DIAGNOSIS — I6789 Other cerebrovascular disease: Secondary | ICD-10-CM | POA: Diagnosis not present

## 2022-09-29 DIAGNOSIS — I10 Essential (primary) hypertension: Secondary | ICD-10-CM | POA: Diagnosis not present

## 2022-09-29 DIAGNOSIS — E785 Hyperlipidemia, unspecified: Secondary | ICD-10-CM | POA: Diagnosis not present

## 2022-09-29 DIAGNOSIS — F02A4 Dementia in other diseases classified elsewhere, mild, with anxiety: Secondary | ICD-10-CM | POA: Diagnosis not present

## 2022-09-29 DIAGNOSIS — G8929 Other chronic pain: Secondary | ICD-10-CM | POA: Diagnosis not present

## 2022-10-06 DIAGNOSIS — F02A4 Dementia in other diseases classified elsewhere, mild, with anxiety: Secondary | ICD-10-CM | POA: Diagnosis not present

## 2022-10-06 DIAGNOSIS — G8929 Other chronic pain: Secondary | ICD-10-CM | POA: Diagnosis not present

## 2022-10-06 DIAGNOSIS — E785 Hyperlipidemia, unspecified: Secondary | ICD-10-CM | POA: Diagnosis not present

## 2022-10-06 DIAGNOSIS — G311 Senile degeneration of brain, not elsewhere classified: Secondary | ICD-10-CM | POA: Diagnosis not present

## 2022-10-06 DIAGNOSIS — I6789 Other cerebrovascular disease: Secondary | ICD-10-CM | POA: Diagnosis not present

## 2022-10-06 DIAGNOSIS — I10 Essential (primary) hypertension: Secondary | ICD-10-CM | POA: Diagnosis not present

## 2022-10-06 DIAGNOSIS — R296 Repeated falls: Secondary | ICD-10-CM | POA: Diagnosis not present

## 2022-10-06 DIAGNOSIS — F32A Depression, unspecified: Secondary | ICD-10-CM | POA: Diagnosis not present

## 2022-10-13 DIAGNOSIS — F02A4 Dementia in other diseases classified elsewhere, mild, with anxiety: Secondary | ICD-10-CM | POA: Diagnosis not present

## 2022-10-13 DIAGNOSIS — I10 Essential (primary) hypertension: Secondary | ICD-10-CM | POA: Diagnosis not present

## 2022-10-13 DIAGNOSIS — E785 Hyperlipidemia, unspecified: Secondary | ICD-10-CM | POA: Diagnosis not present

## 2022-10-13 DIAGNOSIS — G8929 Other chronic pain: Secondary | ICD-10-CM | POA: Diagnosis not present

## 2022-10-13 DIAGNOSIS — G311 Senile degeneration of brain, not elsewhere classified: Secondary | ICD-10-CM | POA: Diagnosis not present

## 2022-10-13 DIAGNOSIS — I6789 Other cerebrovascular disease: Secondary | ICD-10-CM | POA: Diagnosis not present

## 2022-10-14 DIAGNOSIS — G8929 Other chronic pain: Secondary | ICD-10-CM | POA: Diagnosis not present

## 2022-10-14 DIAGNOSIS — G311 Senile degeneration of brain, not elsewhere classified: Secondary | ICD-10-CM | POA: Diagnosis not present

## 2022-10-14 DIAGNOSIS — I6789 Other cerebrovascular disease: Secondary | ICD-10-CM | POA: Diagnosis not present

## 2022-10-14 DIAGNOSIS — I10 Essential (primary) hypertension: Secondary | ICD-10-CM | POA: Diagnosis not present

## 2022-10-14 DIAGNOSIS — E785 Hyperlipidemia, unspecified: Secondary | ICD-10-CM | POA: Diagnosis not present

## 2022-10-14 DIAGNOSIS — F02A4 Dementia in other diseases classified elsewhere, mild, with anxiety: Secondary | ICD-10-CM | POA: Diagnosis not present

## 2022-10-15 DIAGNOSIS — G311 Senile degeneration of brain, not elsewhere classified: Secondary | ICD-10-CM | POA: Diagnosis not present

## 2022-10-15 DIAGNOSIS — E785 Hyperlipidemia, unspecified: Secondary | ICD-10-CM | POA: Diagnosis not present

## 2022-10-15 DIAGNOSIS — G8929 Other chronic pain: Secondary | ICD-10-CM | POA: Diagnosis not present

## 2022-10-15 DIAGNOSIS — F02A4 Dementia in other diseases classified elsewhere, mild, with anxiety: Secondary | ICD-10-CM | POA: Diagnosis not present

## 2022-10-15 DIAGNOSIS — I6789 Other cerebrovascular disease: Secondary | ICD-10-CM | POA: Diagnosis not present

## 2022-10-15 DIAGNOSIS — I10 Essential (primary) hypertension: Secondary | ICD-10-CM | POA: Diagnosis not present

## 2022-10-16 DIAGNOSIS — F02A4 Dementia in other diseases classified elsewhere, mild, with anxiety: Secondary | ICD-10-CM | POA: Diagnosis not present

## 2022-10-16 DIAGNOSIS — E785 Hyperlipidemia, unspecified: Secondary | ICD-10-CM | POA: Diagnosis not present

## 2022-10-16 DIAGNOSIS — I10 Essential (primary) hypertension: Secondary | ICD-10-CM | POA: Diagnosis not present

## 2022-10-16 DIAGNOSIS — G311 Senile degeneration of brain, not elsewhere classified: Secondary | ICD-10-CM | POA: Diagnosis not present

## 2022-10-16 DIAGNOSIS — G8929 Other chronic pain: Secondary | ICD-10-CM | POA: Diagnosis not present

## 2022-10-16 DIAGNOSIS — I6789 Other cerebrovascular disease: Secondary | ICD-10-CM | POA: Diagnosis not present

## 2022-10-17 DIAGNOSIS — F02A4 Dementia in other diseases classified elsewhere, mild, with anxiety: Secondary | ICD-10-CM | POA: Diagnosis not present

## 2022-10-17 DIAGNOSIS — G8929 Other chronic pain: Secondary | ICD-10-CM | POA: Diagnosis not present

## 2022-10-17 DIAGNOSIS — G311 Senile degeneration of brain, not elsewhere classified: Secondary | ICD-10-CM | POA: Diagnosis not present

## 2022-10-17 DIAGNOSIS — I10 Essential (primary) hypertension: Secondary | ICD-10-CM | POA: Diagnosis not present

## 2022-10-17 DIAGNOSIS — I6789 Other cerebrovascular disease: Secondary | ICD-10-CM | POA: Diagnosis not present

## 2022-10-17 DIAGNOSIS — E785 Hyperlipidemia, unspecified: Secondary | ICD-10-CM | POA: Diagnosis not present

## 2022-10-18 DIAGNOSIS — E785 Hyperlipidemia, unspecified: Secondary | ICD-10-CM | POA: Diagnosis not present

## 2022-10-18 DIAGNOSIS — F02A4 Dementia in other diseases classified elsewhere, mild, with anxiety: Secondary | ICD-10-CM | POA: Diagnosis not present

## 2022-10-18 DIAGNOSIS — I10 Essential (primary) hypertension: Secondary | ICD-10-CM | POA: Diagnosis not present

## 2022-10-18 DIAGNOSIS — G311 Senile degeneration of brain, not elsewhere classified: Secondary | ICD-10-CM | POA: Diagnosis not present

## 2022-10-18 DIAGNOSIS — I6789 Other cerebrovascular disease: Secondary | ICD-10-CM | POA: Diagnosis not present

## 2022-10-18 DIAGNOSIS — G8929 Other chronic pain: Secondary | ICD-10-CM | POA: Diagnosis not present

## 2022-10-19 DIAGNOSIS — G8929 Other chronic pain: Secondary | ICD-10-CM | POA: Diagnosis not present

## 2022-10-19 DIAGNOSIS — I10 Essential (primary) hypertension: Secondary | ICD-10-CM | POA: Diagnosis not present

## 2022-10-19 DIAGNOSIS — E785 Hyperlipidemia, unspecified: Secondary | ICD-10-CM | POA: Diagnosis not present

## 2022-10-19 DIAGNOSIS — F02A4 Dementia in other diseases classified elsewhere, mild, with anxiety: Secondary | ICD-10-CM | POA: Diagnosis not present

## 2022-10-19 DIAGNOSIS — G311 Senile degeneration of brain, not elsewhere classified: Secondary | ICD-10-CM | POA: Diagnosis not present

## 2022-10-19 DIAGNOSIS — I6789 Other cerebrovascular disease: Secondary | ICD-10-CM | POA: Diagnosis not present

## 2022-10-20 DIAGNOSIS — G8929 Other chronic pain: Secondary | ICD-10-CM | POA: Diagnosis not present

## 2022-10-20 DIAGNOSIS — F02A4 Dementia in other diseases classified elsewhere, mild, with anxiety: Secondary | ICD-10-CM | POA: Diagnosis not present

## 2022-10-20 DIAGNOSIS — G311 Senile degeneration of brain, not elsewhere classified: Secondary | ICD-10-CM | POA: Diagnosis not present

## 2022-10-20 DIAGNOSIS — E785 Hyperlipidemia, unspecified: Secondary | ICD-10-CM | POA: Diagnosis not present

## 2022-10-20 DIAGNOSIS — I10 Essential (primary) hypertension: Secondary | ICD-10-CM | POA: Diagnosis not present

## 2022-10-20 DIAGNOSIS — I6789 Other cerebrovascular disease: Secondary | ICD-10-CM | POA: Diagnosis not present

## 2022-10-21 DIAGNOSIS — F02A4 Dementia in other diseases classified elsewhere, mild, with anxiety: Secondary | ICD-10-CM | POA: Diagnosis not present

## 2022-10-21 DIAGNOSIS — G311 Senile degeneration of brain, not elsewhere classified: Secondary | ICD-10-CM | POA: Diagnosis not present

## 2022-10-21 DIAGNOSIS — G8929 Other chronic pain: Secondary | ICD-10-CM | POA: Diagnosis not present

## 2022-10-21 DIAGNOSIS — E785 Hyperlipidemia, unspecified: Secondary | ICD-10-CM | POA: Diagnosis not present

## 2022-10-21 DIAGNOSIS — I6789 Other cerebrovascular disease: Secondary | ICD-10-CM | POA: Diagnosis not present

## 2022-10-21 DIAGNOSIS — I10 Essential (primary) hypertension: Secondary | ICD-10-CM | POA: Diagnosis not present

## 2022-11-03 ENCOUNTER — Encounter: Payer: Self-pay | Admitting: Radiology

## 2022-11-04 DEATH — deceased
